# Patient Record
Sex: Female | Born: 1940 | Race: White | Hispanic: No | Smoking: Never smoker
Health system: Southern US, Community
[De-identification: ages and names within clinical notes are randomized; demographics above are authoritative.]

## PROBLEM LIST (undated history)

## (undated) DIAGNOSIS — K219 Gastro-esophageal reflux disease without esophagitis: Secondary | ICD-10-CM

## (undated) DIAGNOSIS — T39395A Adverse effect of other nonsteroidal anti-inflammatory drugs [NSAID], initial encounter: Secondary | ICD-10-CM

## (undated) DIAGNOSIS — Z8739 Personal history of other diseases of the musculoskeletal system and connective tissue: Secondary | ICD-10-CM

## (undated) DIAGNOSIS — R351 Nocturia: Secondary | ICD-10-CM

## (undated) DIAGNOSIS — E0590 Thyrotoxicosis, unspecified without thyrotoxic crisis or storm: Secondary | ICD-10-CM

## (undated) DIAGNOSIS — R112 Nausea with vomiting, unspecified: Secondary | ICD-10-CM

## (undated) DIAGNOSIS — M1990 Unspecified osteoarthritis, unspecified site: Secondary | ICD-10-CM

## (undated) DIAGNOSIS — Z9889 Other specified postprocedural states: Secondary | ICD-10-CM

## (undated) DIAGNOSIS — Q6100 Congenital renal cyst, unspecified: Secondary | ICD-10-CM

## (undated) DIAGNOSIS — E785 Hyperlipidemia, unspecified: Secondary | ICD-10-CM

## (undated) DIAGNOSIS — I10 Essential (primary) hypertension: Secondary | ICD-10-CM

## (undated) DIAGNOSIS — D649 Anemia, unspecified: Secondary | ICD-10-CM

## (undated) DIAGNOSIS — K2960 Other gastritis without bleeding: Secondary | ICD-10-CM

## (undated) DIAGNOSIS — K5730 Diverticulosis of large intestine without perforation or abscess without bleeding: Secondary | ICD-10-CM

## (undated) HISTORY — PX: CHOLECYSTECTOMY: SHX55

## (undated) HISTORY — PX: KNEE ARTHROSCOPY: SHX127

## (undated) HISTORY — PX: ABDOMINAL HYSTERECTOMY: SHX81

## (undated) HISTORY — PX: JOINT REPLACEMENT: SHX530

## (undated) HISTORY — PX: TUBAL LIGATION: SHX77

---

## 1998-09-02 ENCOUNTER — Emergency Department (HOSPITAL_COMMUNITY): Admission: EM | Admit: 1998-09-02 | Discharge: 1998-09-02 | Payer: Self-pay | Admitting: Emergency Medicine

## 1998-09-02 ENCOUNTER — Encounter: Payer: Self-pay | Admitting: Emergency Medicine

## 1998-12-18 ENCOUNTER — Other Ambulatory Visit: Admission: RE | Admit: 1998-12-18 | Discharge: 1998-12-18 | Payer: Self-pay | Admitting: Family Medicine

## 2000-01-18 ENCOUNTER — Encounter: Payer: Self-pay | Admitting: Gastroenterology

## 2000-01-18 ENCOUNTER — Ambulatory Visit (HOSPITAL_COMMUNITY): Admission: RE | Admit: 2000-01-18 | Discharge: 2000-01-18 | Payer: Self-pay | Admitting: Gastroenterology

## 2000-01-28 ENCOUNTER — Ambulatory Visit (HOSPITAL_COMMUNITY): Admission: RE | Admit: 2000-01-28 | Discharge: 2000-01-28 | Payer: Self-pay | Admitting: Gastroenterology

## 2000-01-31 ENCOUNTER — Encounter (HOSPITAL_BASED_OUTPATIENT_CLINIC_OR_DEPARTMENT_OTHER): Payer: Self-pay | Admitting: General Surgery

## 2000-02-02 ENCOUNTER — Encounter (HOSPITAL_BASED_OUTPATIENT_CLINIC_OR_DEPARTMENT_OTHER): Payer: Self-pay | Admitting: General Surgery

## 2000-02-02 ENCOUNTER — Encounter (INDEPENDENT_AMBULATORY_CARE_PROVIDER_SITE_OTHER): Payer: Self-pay | Admitting: *Deleted

## 2000-02-02 ENCOUNTER — Ambulatory Visit (HOSPITAL_COMMUNITY): Admission: RE | Admit: 2000-02-02 | Discharge: 2000-02-03 | Payer: Self-pay | Admitting: General Surgery

## 2000-12-19 ENCOUNTER — Other Ambulatory Visit: Admission: RE | Admit: 2000-12-19 | Discharge: 2000-12-19 | Payer: Self-pay | Admitting: Family Medicine

## 2002-01-22 ENCOUNTER — Ambulatory Visit (HOSPITAL_COMMUNITY): Admission: RE | Admit: 2002-01-22 | Discharge: 2002-01-22 | Payer: Self-pay | Admitting: Endocrinology

## 2002-01-22 ENCOUNTER — Encounter: Payer: Self-pay | Admitting: Endocrinology

## 2004-12-22 ENCOUNTER — Ambulatory Visit (HOSPITAL_BASED_OUTPATIENT_CLINIC_OR_DEPARTMENT_OTHER): Admission: RE | Admit: 2004-12-22 | Discharge: 2004-12-22 | Payer: Self-pay | Admitting: Orthopedic Surgery

## 2004-12-22 ENCOUNTER — Ambulatory Visit (HOSPITAL_COMMUNITY): Admission: RE | Admit: 2004-12-22 | Discharge: 2004-12-22 | Payer: Self-pay | Admitting: Orthopedic Surgery

## 2007-10-10 ENCOUNTER — Encounter (INDEPENDENT_AMBULATORY_CARE_PROVIDER_SITE_OTHER): Payer: Self-pay | Admitting: Obstetrics and Gynecology

## 2007-10-10 ENCOUNTER — Ambulatory Visit (HOSPITAL_COMMUNITY): Admission: RE | Admit: 2007-10-10 | Discharge: 2007-10-11 | Payer: Self-pay | Admitting: Obstetrics and Gynecology

## 2009-01-21 ENCOUNTER — Encounter (HOSPITAL_COMMUNITY): Admission: RE | Admit: 2009-01-21 | Discharge: 2009-04-06 | Payer: Self-pay | Admitting: Family Medicine

## 2009-01-23 ENCOUNTER — Ambulatory Visit (HOSPITAL_BASED_OUTPATIENT_CLINIC_OR_DEPARTMENT_OTHER): Admission: RE | Admit: 2009-01-23 | Discharge: 2009-01-23 | Payer: Self-pay | Admitting: Orthopedic Surgery

## 2009-03-18 ENCOUNTER — Encounter: Admission: RE | Admit: 2009-03-18 | Discharge: 2009-03-18 | Payer: Self-pay | Admitting: Endocrinology

## 2009-08-19 ENCOUNTER — Encounter: Admission: RE | Admit: 2009-08-19 | Discharge: 2009-08-19 | Payer: Self-pay | Admitting: Endocrinology

## 2010-09-20 ENCOUNTER — Other Ambulatory Visit: Payer: Self-pay | Admitting: Family Medicine

## 2010-09-21 ENCOUNTER — Ambulatory Visit
Admission: RE | Admit: 2010-09-21 | Discharge: 2010-09-21 | Disposition: A | Discharge status: Home / Self Care | Payer: Medicare Other | Source: Ambulatory Visit | Attending: Family Medicine | Admitting: Family Medicine

## 2010-09-21 MED ORDER — IOHEXOL 300 MG/ML  SOLN
100.0000 mL | Freq: Once | INTRAMUSCULAR | Status: AC | PRN
  Administered 2010-09-21: 100 mL via INTRAVENOUS

## 2010-10-10 LAB — BASIC METABOLIC PANEL
BUN: 10 mg/dL (ref 6–23)
Chloride: 105 mEq/L (ref 96–112)
Potassium: 3.7 mEq/L (ref 3.5–5.1)
Sodium: 140 mEq/L (ref 135–145)

## 2010-10-10 LAB — POCT HEMOGLOBIN-HEMACUE: Hemoglobin: 12.9 g/dL (ref 12.0–15.0)

## 2010-11-16 NOTE — Op Note (Signed)
NAMEDAWN, CONVERY NO.:  [id number]   MEDICAL RECORD NO.:  [id number]          PATIENT TYPE:  AMB   LOCATION:  SDC                           FACILITY:  [facility]   PHYSICIAN:  Huel Cote, M.D. DATE OF BIRTH:  02-Dec-1940   DATE OF PROCEDURE:  10/10/2007  DATE OF DISCHARGE:                               OPERATIVE REPORT   PREOPERATIVE DIAGNOSIS:  Pelvic prolapse.   POSTOPERATIVE DIAGNOSIS:  Pelvic prolapse.   PROCEDURE:  Total vaginal hysterectomy, bilateral salpingo-oophorectomy  and anterior posterior repair.   SURGEON:  Dr. Huel Cote.   ASSISTANT:  Dr. Tracey Harries.   ANESTHESIA:  General.   FINDINGS:  There is a small uterus noted and atrophic ovaries as well.  Large cystocele grade 3 and a moderate rectocele noted.  The patient did  have a slightly shortened vagina anatomically prior to procedure  beginning.   ESTIMATED BLOOD LOSS:  100 mL.   URINE OUTPUT:  Approximately 600 mL clear urine.   INTRAVENOUS FLUIDS:  1200 mL LR.   PROCEDURE:  The patient was taken to operating room where general  anesthesia was obtained without difficulty.  She was then prepped and  draped in the normal sterile fashion in the dorsal lithotomy position.  A weighted speculum was then placed within the vagina, the cervix  identified and noted to be prolapsing all way to the introitus.  The  cervix was injected circumferentially with a dilute solution of  Pitressin and then was circumferentially incised with the Bovie cautery.  This was then taken down sharply with the Mayo scissors with the vaginal  mucosa sharply dissected off the underlying cervix.  The anterior cul-de-  sac was pushed away from the overlying cervix and a Sims retractor  placed within that area.  The posterior cul-de-sac was entered sharply  and the Bonanno speculum placed within the patient's pelvis.  The  uterosacral ligaments were then taken down bilaterally with Zeppelin  clamps,  transected and suture ligated with 0 Vicryl.  With this  accomplished, the anterior cul-de-sac was able to be identified and  entered and thus the uterus was isolated from the bladder and the  rectum.  The remainder of the broad ligament and utero-ovarian ligaments  were then taken down sequentially with Zeppelin clamps.  Each step was  transected and suture ligated with a 0 Vicryl.  Once the uterus had been  completely amputated, it was handed off and the utero-ovarian pedicles  retracted downward.  The ovaries were then visible and grasped with a  Babcock clamp and pulled medially.  A Haney clamp was then placed around  the adnexa with ovary and at least a portion of the fallopian tube that  could be seen clamped, transected and the free infundibulopelvic  ligament was then suture ligated x2 with 0 Vicryl for hemostasis.  This  was done bilaterally with the adnexa handed off to pathology.  At this  point, all pedicles were inspected and there was no active bleeding  noted at any site.  The peritoneum was then identified and grasped with  a Allis  clamp and a pursestring suture was placed around the peritoneum  with 2-0 Vicryl swedged-on suture.  This was held and the uterosacral  ligaments were reapproximated with 0 Vicryl in a interrupted suture for  added cuff support.  The previously held uterosacral ligaments were also  tied to one another.  At this point, the peritoneal suture was cinched  down after all instruments and sponges were removed from the vagina.  All appeared hemostatic and therefore Allis clamps were utilized to  grasp the anterior portion of the vaginal cuff.  This was retracted  downwards and the vaginal mucosa overlying her moderate cystocele was  dissected away using the Metzenbaum scissors.  Allis clamps were used to  retract the overlying vaginal mucosa laterally and the pubovesical  fascia was dissected off the mucosa and pushed medially.  This was  performed  bilaterally and the cystocele taken back to approximately 3 cm  off of the vaginal mucosa.  At this point, once the flaps had been  successfully dissected off cystocele, the pubovesical fascia was  reapproximated with several interrupted sutures of 0 Vicryl to reduce  the cystocele.  This was performed nicely and the excess vaginal mucosa  trimmed away and sutured with a 2-0 Vicryl in a running locked fashion.  The vaginal cuff was then likewise closed with 2-0 Vicryl in a running  fashion and all appeared hemostatic.  The posterior portion was  inspected, the floor of the vagina and there was a rectocele noted.  It  was determined to do a conservative rectocele repair with a posterior  repair given that the vagina was anatomically shortened to begin with  and to enable the patient to continue to be sexually active if she so  desired after surgery.  The introitus was grasped with two Allis clamps  and the vaginal mucosa trimmed with Mayo scissors.  This was underscored  in the midline and the vaginal mucosa trimmed away from the underlying  rectovaginal fascia.  This was carried up to approximately within 1 cm  of the cuff and the vaginal mucosa flaps dissected off the underlying  rectovaginal fascia for approximately 1.5-2 cm.  With this performed,  the rectocele was reduced with several interrupted sutures of 0 Vicryl  to reapproximate the rectovaginal fascia.  Once this was reduced, the  mucosa was conservatively trimmed away and closed with 2-0 Vicryl in a  running locked fashion.  At the conclusion of the procedure, the vagina  was hemostatic.  There was no active bleeding noted.  All sponge, lap  and needle counts were correct x2 and the patient was awakened and taken  to the recovery room in stable condition after Estrace coated vaginal  packing was placed within the vagina.      Huel Cote, M.D.  Electronically Signed     KR/MEDQ  D:  10/10/2007  T:  10/10/2007  Job:   [id number]

## 2010-11-16 NOTE — Op Note (Signed)
NAMECANDELARIA, Jackie Lynch                ACCOUNT NO.:  [id number]   MEDICAL RECORD NO.:  [id number]          PATIENT TYPE:  AMB   LOCATION:  DSC                          FACILITY:  [facility]   PHYSICIAN:  Harvie Junior, M.D.   DATE OF BIRTH:  Jan 02, 1941   DATE OF PROCEDURE:  01/23/2009  DATE OF DISCHARGE:                               OPERATIVE REPORT   PREOPERATIVE DIAGNOSIS:  Medial meniscal tear.   POSTOPERATIVE DIAGNOSES:  1. Medial meniscal tear.  2. Chondromalacia of patellofemoral joint.   PROCEDURE:  Debridement of posterior horn medial meniscal tear.   BRIEF HISTORY:  Ms. Pettaway is a 70 year old female with a long history of  having had significant left knee pain.  We treated conservatively for a  long period of period time.  We had previously done a right knee  arthroscopy and it worked very well and she came in with catching and  locking with similar symptoms on the opposite side.  After discussion of  treatment options, we ultimately felt the most appropriate course of  action was to take her to the operating room for operative knee  arthroscopy.  She is brought to the operating room for this procedure.   PROCEDURE IN DETAIL:  The patient was brought to the operating room.  After adequate anesthesia was obtained with general anesthetic, the  patient was placed supine on the operating table.  The left leg was  prepped and draped in usual sterile fashion.  Following this, routine  arthroscopic examination of the left knee revealed there is an obvious  posterior horn medial meniscal tear.  This was debrided back to a smooth  and stable rim with straight-biting forceps, upbiting forceps, left and  side-biting forceps.  Once this completed, attention was turned to the  ACL which was normal.  Attention was turned to the lateral side which  had a little bit of peripheral sort of meniscal issue, nothing dramatic.  This was debrided minimally on the lateral side.  Attention was turned  to the patellofemoral joint which showed some midline tracking.  There  was no significant chondromalacia of the patellofemoral joint.  At this  point, the knee was copiously and thoroughly irrigated and suctioned  dried.  The arthroscopic portals were closed with a bandage.  A sterile  compressive dressing was applied.  The patient was taken to the recovery  room and was noted to be in satisfactory condition.  Estimated blood  loss for this procedure was none.      Harvie Junior, M.D.  Electronically Signed     JLG/MEDQ  D:  01/23/2009  T:  01/24/2009  Job:  [id number]

## 2010-11-16 NOTE — H&P (Signed)
Jackie Lynch, Jackie Lynch NO.:  [id number]   MEDICAL RECORD NO.:  [id number]           PATIENT TYPE:   LOCATION:                                 FACILITY:   PHYSICIAN:  Huel Cote, M.D. DATE OF BIRTH:  26-Mar-1941   DATE OF ADMISSION:  DATE OF DISCHARGE:                              HISTORY & PHYSICAL   PREOPERATIVE HISTORY AND PHYSICAL:  Surgery to take place at the [facility] facility on October 10, 2007, at 8:30 a.m.   The patient is a 70 year old G2, P2, who is coming in for a scheduled  total vaginal hysterectomy, bilateral salpingo-oophorectomy, anterior  posterior repair secondary to pelvic prolapse.  She began to notice her  prolapse worsening over the last several years and has been wanting to  proceed with fixing this problem and has recently felt that it is  becoming more symptomatic in terms of pelvic pressure and discomfort.  She did report some mild stress urinary incontinence, and,  given that  she has a grade 3 cystocele, she underwent urodynamic testing  concurrently and really could not demonstrate leakage.  She did have an  abnormal postvoid residual of approximately 200 mL after the procedure  but does feel that this is probably secondary to the bulge of her  bladder and that she cannot empty this well.  Given this abnormal post-  void residual and the lack of demonstration of leakage,  we discussed  with urology consult informally, and they felt it best not to proceed  with any concurrent sling procedure but to simply correct her  significant cystocele and see if that improves her postvoid residuals  and does not cause significant leakage.   PAST MEDICAL HISTORY:  Significant for chronic hypertension,  hypercholesterolemia and reflux disease.   PAST SURGICAL HISTORY:  She has had a cholecystectomy and a tubal  ligation.   She has had no abnormal Pap smears.  Her 2 obstetrical deliveries were  vaginal, one a 9 pound, 14 ounce infant  and one a 7 pound, 11 ounce  infant.   Her medications currently include amlodipine, hydrochlorothiazide,  metoprolol, clonidine, Diovan, and Vytorin as well as Nexium and  alendronate.   She has no allergies to any medications.   PHYSICAL EXAMINATION:  VITAL SIGNS:  Her height is 4'11.  Weight is 179  pounds.  CARDIAC:  Regular rate and rhythm.  LUNGS:  Clear.  ABDOMEN:  Soft and nontender.  PELVIC:  Exam is significant for uterine prolapse down to the level of  the introitus with a small uterus palpated.  She also has a grade 3  cystocele and a mild-to-moderate rectocele noted.   We discussed all surgical options, and the patient elects to proceed  with a vaginal hysterectomy and also wishes her tubes and ovaries  removed.  We will do this and proceed with an anterior posterior repair  to correct the cystocele.  She understands the risks of surgery,  including bleeding, infection and possible damage to bowel and  bladder.  She also understands that should a complication arise she would need  an  abdominal incision to deal with this.  She also  is aware that given her abnormal postvoid residual that her urinary  symptoms could worsen after surgery, and, if this is the case, we would  deal with this in a separate procedure.  The patient understands all  risks and  benefits and desires to proceed with surgery as stated.      Huel Cote, M.D.  Electronically Signed     KR/MEDQ  D:  10/09/2007  T:  10/09/2007  Job:  [id number]

## 2010-11-19 NOTE — Op Note (Signed)
[place]  Patient:    Jackie Lynch                        MRN: [id number] Proc. Date: 02/02/00 Adm. Date:  [id number] Disc. Date: [id number] Attending:  Sonda Primes CC:         Mardene Celeste. Lurene Shadow, M.D. (2)                           Operative Report  PREOPERATIVE DIAGNOSES:  Cholesterolosis, adenomyosis of the gallbladder.  POSTOPERATIVE DIAGNOSES:  Cholesterolosis, adenomyosis of the gallbladder.  PROCEDURE:  Laparoscopic cholecystectomy with intraoperative cholangiogram.  SURGEON:  Luisa Hart L. Lurene Shadow, M.D.  ASSISTANT:  Marnee Spring. Wiliam Ke, M.D.  ANESTHESIA:  General endotracheal anesthesia.  INDICATIONS:  This patient is a 70 year old woman presenting with rather classical gallbladder symptoms with bloating, abdominal pain and nausea following meals.  She had a gallbladder ultrasound which was essentially unremarkable for the evidence of some adenomyosis and floating mobile debris within the gallbladder.  A hepatobiliary scan which was done was otherwise normal showing normal gallbladder function and emptying.  Because of her persistent symptoms I feel shes got adenomyosis and is brought to the operating room for this purpose.  PROCEDURE:  Following the induction of satisfactory general endotracheal anesthesia with the patient positioned supinely the abdomen prepped and draped routinely.  Open laparoscopy of the umbilicus for the insertion of a Hasson cannula and insufflation of the peritoneal cavity with 14 mmHg pressure using carbon dioxide.  Visual exploration showed the liver edge to be sharp, liver surfaces smooth.  Enterogastric wall and duodenal seemed to be normal.  Neither the small and large intestine appeared to be abnormal.  Under direct vision, epigastric and lateral ports were placed, and the gallbladder was grasped and retracted cephalad with multiple adhesions of the gallbladder being taken down and dissection  carried down to the region of the ampulla.  In this area the cystic artery and cystic duct were both positively identified.  The cystic duct being traced to the cystic duct gallbladder junction and the cystic artery traced up into the gallbladder wall.  The cystic artery was doubly clipped and transected.  The cystic duct was cut proximally and opened.  I used a Riddick catheter passed through a 14 gauge angiocath into the cystic duct and the cholangiogram was taken.  The resulting cholangiogram showed a rather long cystic duct with free flow of contrast into the common bile duct and normal empyting into the duodenum.  The upper hepatic radicals appeared to be normal.  The cholangiocatheter was removed and the cystic duct was doubly clipped and then removed.  The gallbladder was then dissected free from the liver bed and maintaining hemostasis throughout the course of dissection with electrocautery.  At the end of dissection all areas within the liver bed were checked and noted to be dry.  Sponge, instrument and sharp counts were then verified as the gallbladder was removed through the umbilical port and the camera placed in the epigastric port.  The pneumoperitoneum was then allowed to deflate and the wound closed in layers as follows.  Umbilical wounds in two layers with 0 Dexon and 4-0 Dexon, epigastric and lateral flank wounds were closed with 4-0 Dexon sutures.  All wounds were reinforced with Steri-Strips.  Sterile dressings were applied.  Anesthetic was reversed.  The patient was removed from the operating room to the  recovery room in stable condition having tolerated the procedure well. DD:  02/02/00 TD:  02/03/00 Job: [id number]

## 2010-11-19 NOTE — Op Note (Signed)
[name], Jackie Lynch                ACCOUNT NO.:  [id number]   MEDICAL RECORD NO.:  [id number]          PATIENT TYPE:  AMB   LOCATION:  DSC                          FACILITY:  [facility]   PHYSICIAN:  Harvie Junior, M.D.   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   PREOPERATIVE DIAGNOSIS:  Medial meniscal tear with Baker's cyst.   POSTOPERATIVE DIAGNOSIS:  Medial meniscal tear with Baker's cyst.   PRINCIPAL PROCEDURE:  Partial posterior arm medial meniscectomy.   SURGEON:  Jodi Geralds, M.D.   ASSISTANT:  Jackie Lynch, P.A.   ANESTHESIA:  General.   BRIEF HISTORY:  She is a 70 year old female with a long history of having  right knee pain.  She ultimately was evaluated in the office and felt to  have a medial meniscal tear.  MRI was obtained prior to her coming which  showed medial meniscal tear.  It also showed a Baker's cyst.  We had a long  talk about her treatment options and also felt that operative knee  arthroscopy was the most critical course of action.  She is brought to the  operating room for this procedure.   PROCEDURE:  Patient brought to the operating room and after adequate  anesthesia was obtained with a general anesthetic the patient was placed  upon the operating room table.  The right leg was then prepped and draped in  the usual sterile fashion.  Following this, routine arthroscopic examination  of the knee revealed there was an obvious posterior horn medial meniscal  tear.  This was debrided back to a smooth and stable rim.  Attention was  turned to the medial femoral condyle where there was some grade 2 change but  nothing dramatic.  The ACL was normal, lateral side was evaluated and had a  little bit of peripheral fray which was debrided, but nothing dramatic.  At  this point, attention was turned up into the  patellofemoral joint where  there was no significant chondromalacia and  patella tracking was midline.  At this point, the knee was copiously  irrigated and suctioned dry.  The arthroscopic portal was closed with a  bandage, a sterile compression dressing was applied and the patient taken to  the recovery room.  She was noted to be in satisfactory condition.  Estimated blood loss for procedure was none.       JLG/MEDQ  D:  12/22/2004  T:  12/22/2004  Job:  [id number]   cc:   Scherrie Bateman  [address]  [place]  [place] [address]  Fax: [phone]

## 2011-02-17 ENCOUNTER — Other Ambulatory Visit: Payer: Self-pay | Admitting: Neurology

## 2011-02-17 DIAGNOSIS — G500 Trigeminal neuralgia: Secondary | ICD-10-CM

## 2011-02-23 ENCOUNTER — Ambulatory Visit
Admission: RE | Admit: 2011-02-23 | Discharge: 2011-02-23 | Disposition: A | Discharge status: Home / Self Care | Payer: Medicare Other | Source: Ambulatory Visit | Attending: Neurology | Admitting: Neurology

## 2011-02-23 DIAGNOSIS — G500 Trigeminal neuralgia: Secondary | ICD-10-CM

## 2011-02-23 MED ORDER — GADOBENATE DIMEGLUMINE 529 MG/ML IV SOLN
15.0000 mL | Freq: Once | INTRAVENOUS | Status: AC
  Administered 2011-02-23: 15 mL via INTRAVENOUS

## 2011-03-29 LAB — CBC
HCT: 37.9
Hemoglobin: 13.2
Platelets: 188
RBC: 3.72 — ABNORMAL LOW
RBC: 4.41
RDW: 13.4
WBC: 15.2 — ABNORMAL HIGH
WBC: 7.7

## 2011-03-29 LAB — COMPREHENSIVE METABOLIC PANEL
ALT: 19
Alkaline Phosphatase: 70
BUN: 19
CO2: 27
Chloride: 105
GFR calc non Af Amer: >60
Glucose, Bld: 113 — ABNORMAL HIGH
Potassium: 3.6
Sodium: 139
Total Bilirubin: 0.6

## 2011-08-09 DIAGNOSIS — K219 Gastro-esophageal reflux disease without esophagitis: Secondary | ICD-10-CM | POA: Diagnosis not present

## 2011-08-09 DIAGNOSIS — R1013 Epigastric pain: Secondary | ICD-10-CM | POA: Diagnosis not present

## 2011-08-09 DIAGNOSIS — K5900 Constipation, unspecified: Secondary | ICD-10-CM | POA: Diagnosis not present

## 2011-08-09 DIAGNOSIS — R141 Gas pain: Secondary | ICD-10-CM | POA: Diagnosis not present

## 2011-08-09 DIAGNOSIS — K625 Hemorrhage of anus and rectum: Secondary | ICD-10-CM | POA: Diagnosis not present

## 2011-09-08 DIAGNOSIS — M5137 Other intervertebral disc degeneration, lumbosacral region: Secondary | ICD-10-CM | POA: Diagnosis not present

## 2011-09-08 DIAGNOSIS — M169 Osteoarthritis of hip, unspecified: Secondary | ICD-10-CM | POA: Diagnosis not present

## 2011-09-08 DIAGNOSIS — M25559 Pain in unspecified hip: Secondary | ICD-10-CM | POA: Diagnosis not present

## 2011-09-22 DIAGNOSIS — E039 Hypothyroidism, unspecified: Secondary | ICD-10-CM | POA: Diagnosis not present

## 2011-09-28 DIAGNOSIS — I10 Essential (primary) hypertension: Secondary | ICD-10-CM | POA: Diagnosis not present

## 2011-09-28 DIAGNOSIS — E0520 Thyrotoxicosis with toxic multinodular goiter without thyrotoxic crisis or storm: Secondary | ICD-10-CM | POA: Diagnosis not present

## 2011-10-20 DIAGNOSIS — M25559 Pain in unspecified hip: Secondary | ICD-10-CM | POA: Diagnosis not present

## 2011-10-31 ENCOUNTER — Encounter (HOSPITAL_COMMUNITY): Payer: Self-pay

## 2011-11-01 ENCOUNTER — Other Ambulatory Visit: Payer: Self-pay | Admitting: Orthopedic Surgery

## 2011-11-10 ENCOUNTER — Encounter (HOSPITAL_COMMUNITY)
Admission: RE | Admit: 2011-11-10 | Discharge: 2011-11-10 | Disposition: A | Discharge status: Home / Self Care | Payer: Medicare Other | Source: Ambulatory Visit | Attending: Orthopedic Surgery | Admitting: Orthopedic Surgery

## 2011-11-10 ENCOUNTER — Encounter (HOSPITAL_COMMUNITY): Payer: Self-pay

## 2011-11-10 DIAGNOSIS — I10 Essential (primary) hypertension: Secondary | ICD-10-CM | POA: Diagnosis not present

## 2011-11-10 DIAGNOSIS — M129 Arthropathy, unspecified: Secondary | ICD-10-CM | POA: Diagnosis not present

## 2011-11-10 DIAGNOSIS — M169 Osteoarthritis of hip, unspecified: Secondary | ICD-10-CM | POA: Diagnosis not present

## 2011-11-10 DIAGNOSIS — M545 Low back pain, unspecified: Secondary | ICD-10-CM | POA: Diagnosis not present

## 2011-11-10 DIAGNOSIS — Z01811 Encounter for preprocedural respiratory examination: Secondary | ICD-10-CM | POA: Diagnosis not present

## 2011-11-10 DIAGNOSIS — K219 Gastro-esophageal reflux disease without esophagitis: Secondary | ICD-10-CM | POA: Diagnosis not present

## 2011-11-10 HISTORY — DX: Other gastritis without bleeding: K29.60

## 2011-11-10 HISTORY — DX: Thyrotoxicosis, unspecified without thyrotoxic crisis or storm: E05.90

## 2011-11-10 HISTORY — DX: Congenital renal cyst, unspecified: Q61.00

## 2011-11-10 HISTORY — DX: Essential (primary) hypertension: I10

## 2011-11-10 HISTORY — DX: Adverse effect of other nonsteroidal anti-inflammatory drugs (NSAID), initial encounter: T39.395A

## 2011-11-10 HISTORY — DX: Nausea with vomiting, unspecified: Z98.890

## 2011-11-10 HISTORY — DX: Diverticulosis of large intestine without perforation or abscess without bleeding: K57.30

## 2011-11-10 HISTORY — DX: Hyperlipidemia, unspecified: E78.5

## 2011-11-10 HISTORY — DX: Nocturia: R35.1

## 2011-11-10 HISTORY — DX: Gastro-esophageal reflux disease without esophagitis: K21.9

## 2011-11-10 HISTORY — DX: Nausea with vomiting, unspecified: R11.2

## 2011-11-10 HISTORY — DX: Unspecified osteoarthritis, unspecified site: M19.90

## 2011-11-10 LAB — PROTIME-INR
INR: 0.98 (ref 0.00–1.49)
Prothrombin Time: 13.2 seconds (ref 11.6–15.2)

## 2011-11-10 LAB — DIFFERENTIAL
Basophils Absolute: 0.1 10*3/uL (ref 0.0–0.1)
Lymphocytes Relative: 28 % (ref 12–46)
Monocytes Absolute: 0.8 10*3/uL (ref 0.1–1.0)
Neutro Abs: 5.9 10*3/uL (ref 1.7–7.7)

## 2011-11-10 LAB — TYPE AND SCREEN
ABO/RH(D): O NEG
Antibody Screen: NEGATIVE

## 2011-11-10 LAB — CBC
MCH: 29.8 pg (ref 26.0–34.0)
MCV: 88.3 fL (ref 78.0–100.0)
Platelets: 215 10*3/uL (ref 150–400)
RBC: 4.09 MIL/uL (ref 3.87–5.11)

## 2011-11-10 LAB — URINALYSIS, ROUTINE W REFLEX MICROSCOPIC
Bilirubin Urine: NEGATIVE
Hgb urine dipstick: NEGATIVE
Ketones, ur: NEGATIVE mg/dL
Protein, ur: NEGATIVE mg/dL
Urobilinogen, UA: 0.2 mg/dL (ref 0.0–1.0)

## 2011-11-10 LAB — COMPREHENSIVE METABOLIC PANEL
AST: 25 U/L (ref 0–37)
CO2: 24 mEq/L (ref 19–32)
Calcium: 9.6 mg/dL (ref 8.4–10.5)
Creatinine, Ser: 0.99 mg/dL (ref 0.50–1.10)
GFR calc Af Amer: 66 mL/min — ABNORMAL LOW (ref >90)
GFR calc non Af Amer: 57 mL/min — ABNORMAL LOW (ref >90)

## 2011-11-10 NOTE — Pre-Procedure Instructions (Signed)
20 Jackie Lynch  11/10/2011   Your procedure is scheduled on:  Monday, May 13  Report to [facility] at 1100 AM.  Call this number if you have problems the morning of surgery: [phone]   Remember:   Do not eat food:After Midnight.  May have clear liquids: up to 4 Hours before arrival.(0700am)  Clear liquids include soda, tea, black coffee, apple or grape juice, broth.  Take these medicines the morning of surgery with A SIP OF WATER: *Amlodipine,Clonidine,Nexium,Methamazole,Metoprolol**   Do not wear jewelry, make-up or nail polish.  Do not wear lotions, powders, or perfumes. You may wear deodorant.  Do not shave 48 hours prior to surgery.  Do not bring valuables to the hospital.  Contacts, dentures or bridgework may not be worn into surgery.  Leave suitcase in the car. After surgery it may be brought to your room.  For patients admitted to the hospital, checkout time is 11:00 AM the day of discharge.   Patients discharged the day of surgery will not be allowed to drive home.  Name and phone number of your driver: *n/a**  Special Instructions: Incentive Spirometry - Practice and bring it with you on the day of surgery. and CHG Shower Use Special Wash: 1/2 bottle night before surgery and 1/2 bottle morning of surgery.   Please read over the following fact sheets that you were given: Pain Booklet, Coughing and Deep Breathing, Blood Transfusion Information, Total Joint Packet, MRSA Information and Surgical Site Infection Prevention

## 2011-11-13 MED ORDER — CEFAZOLIN SODIUM-DEXTROSE 2-3 GM-% IV SOLR
2.0000 g | INTRAVENOUS | Status: AC
  Administered 2011-11-14: 2 g via INTRAVENOUS
  Filled 2011-11-13: qty 50

## 2011-11-14 ENCOUNTER — Inpatient Hospital Stay (HOSPITAL_COMMUNITY): Payer: Medicare Other

## 2011-11-14 ENCOUNTER — Inpatient Hospital Stay (HOSPITAL_COMMUNITY)
Admission: RE | Admit: 2011-11-14 | Discharge: 2011-11-17 | DRG: 470 | Disposition: A | Discharge status: Home Health | Payer: Medicare Other | Source: Ambulatory Visit | Attending: Orthopedic Surgery | Admitting: Orthopedic Surgery

## 2011-11-14 ENCOUNTER — Encounter (HOSPITAL_COMMUNITY): Payer: Self-pay | Admitting: *Deleted

## 2011-11-14 ENCOUNTER — Encounter (HOSPITAL_COMMUNITY)
Admission: RE | Disposition: A | Discharge status: Home Health | Payer: Self-pay | Source: Ambulatory Visit | Attending: Orthopedic Surgery

## 2011-11-14 ENCOUNTER — Ambulatory Visit (HOSPITAL_COMMUNITY): Payer: Medicare Other | Admitting: Anesthesiology

## 2011-11-14 ENCOUNTER — Encounter (HOSPITAL_COMMUNITY): Payer: Self-pay | Admitting: Anesthesiology

## 2011-11-14 DIAGNOSIS — E876 Hypokalemia: Secondary | ICD-10-CM | POA: Diagnosis present

## 2011-11-14 DIAGNOSIS — M1610 Unilateral primary osteoarthritis, unspecified hip: Principal | ICD-10-CM | POA: Diagnosis present

## 2011-11-14 DIAGNOSIS — N289 Disorder of kidney and ureter, unspecified: Secondary | ICD-10-CM | POA: Diagnosis present

## 2011-11-14 DIAGNOSIS — Z96649 Presence of unspecified artificial hip joint: Secondary | ICD-10-CM | POA: Diagnosis not present

## 2011-11-14 DIAGNOSIS — I10 Essential (primary) hypertension: Secondary | ICD-10-CM | POA: Diagnosis present

## 2011-11-14 DIAGNOSIS — Z01812 Encounter for preprocedural laboratory examination: Secondary | ICD-10-CM

## 2011-11-14 DIAGNOSIS — M169 Osteoarthritis of hip, unspecified: Secondary | ICD-10-CM | POA: Diagnosis present

## 2011-11-14 DIAGNOSIS — M25559 Pain in unspecified hip: Secondary | ICD-10-CM | POA: Diagnosis not present

## 2011-11-14 DIAGNOSIS — K5900 Constipation, unspecified: Secondary | ICD-10-CM | POA: Diagnosis present

## 2011-11-14 DIAGNOSIS — K219 Gastro-esophageal reflux disease without esophagitis: Secondary | ICD-10-CM | POA: Diagnosis present

## 2011-11-14 DIAGNOSIS — M129 Arthropathy, unspecified: Secondary | ICD-10-CM | POA: Diagnosis not present

## 2011-11-14 DIAGNOSIS — M1612 Unilateral primary osteoarthritis, left hip: Secondary | ICD-10-CM | POA: Diagnosis present

## 2011-11-14 DIAGNOSIS — E785 Hyperlipidemia, unspecified: Secondary | ICD-10-CM | POA: Diagnosis present

## 2011-11-14 DIAGNOSIS — N19 Unspecified kidney failure: Secondary | ICD-10-CM | POA: Diagnosis not present

## 2011-11-14 DIAGNOSIS — Z471 Aftercare following joint replacement surgery: Secondary | ICD-10-CM | POA: Diagnosis not present

## 2011-11-14 HISTORY — PX: TOTAL HIP ARTHROPLASTY: SHX124

## 2011-11-14 SURGERY — ARTHROPLASTY, HIP, TOTAL,POSTERIOR APPROACH
Anesthesia: General | Site: Hip | Laterality: Left | Wound class: Clean

## 2011-11-14 MED ORDER — CLONIDINE HCL 0.3 MG PO TABS
0.3000 mg | ORAL_TABLET | Freq: Two times a day (BID) | ORAL | Status: DC
  Administered 2011-11-14 – 2011-11-16 (×3): 0.3 mg via ORAL
  Filled 2011-11-14 (×7): qty 1

## 2011-11-14 MED ORDER — ZOLPIDEM TARTRATE 5 MG PO TABS: 5.0000 mg | ORAL_TABLET | Freq: Every evening | ORAL | Status: DC | PRN

## 2011-11-14 MED ORDER — HYDROCHLOROTHIAZIDE 25 MG PO TABS
25.0000 mg | ORAL_TABLET | Freq: Every day | ORAL | Status: DC
  Filled 2011-11-14 (×4): qty 1

## 2011-11-14 MED ORDER — METHIMAZOLE 5 MG PO TABS
5.0000 mg | ORAL_TABLET | ORAL | Status: DC
  Administered 2011-11-14 – 2011-11-16 (×2): 5 mg via ORAL
  Filled 2011-11-14 (×3): qty 1

## 2011-11-14 MED ORDER — ONDANSETRON HCL 4 MG/2ML IJ SOLN
INTRAMUSCULAR | Status: DC | PRN
  Administered 2011-11-14: 4 mg via INTRAVENOUS

## 2011-11-14 MED ORDER — COUMADIN BOOK
Freq: Once | Status: DC
  Filled 2011-11-14: qty 1

## 2011-11-14 MED ORDER — POVIDONE-IODINE 7.5 % EX SOLN
Freq: Once | CUTANEOUS | Status: DC
  Filled 2011-11-14: qty 118

## 2011-11-14 MED ORDER — NEOSTIGMINE METHYLSULFATE 1 MG/ML IJ SOLN
INTRAMUSCULAR | Status: DC | PRN
  Administered 2011-11-14: 3 mg via INTRAVENOUS

## 2011-11-14 MED ORDER — WARFARIN - PHARMACIST DOSING INPATIENT: Freq: Every day | Status: DC

## 2011-11-14 MED ORDER — HYDROMORPHONE HCL PF 1 MG/ML IJ SOLN
0.2500 mg | INTRAMUSCULAR | Status: DC | PRN
  Administered 2011-11-14 (×3): 0.5 mg via INTRAVENOUS

## 2011-11-14 MED ORDER — HYDROMORPHONE HCL PF 1 MG/ML IJ SOLN: 0.2500 mg | INTRAMUSCULAR | Status: DC | PRN

## 2011-11-14 MED ORDER — ACETAMINOPHEN 10 MG/ML IV SOLN
INTRAVENOUS | Status: AC
  Filled 2011-11-14: qty 100

## 2011-11-14 MED ORDER — PANTOPRAZOLE SODIUM 40 MG PO TBEC
80.0000 mg | DELAYED_RELEASE_TABLET | Freq: Every day | ORAL | Status: DC
  Administered 2011-11-15 – 2011-11-17 (×3): 80 mg via ORAL
  Filled 2011-11-14 (×4): qty 2

## 2011-11-14 MED ORDER — DROPERIDOL 2.5 MG/ML IJ SOLN: 0.6250 mg | INTRAMUSCULAR | Status: DC | PRN

## 2011-11-14 MED ORDER — PROPOFOL 10 MG/ML IV EMUL
INTRAVENOUS | Status: DC | PRN
  Administered 2011-11-14: 30 mg via INTRAVENOUS
  Administered 2011-11-14: 150 mg via INTRAVENOUS
  Administered 2011-11-14: 20 mg via INTRAVENOUS

## 2011-11-14 MED ORDER — ALUM & MAG HYDROXIDE-SIMETH 200-200-20 MG/5ML PO SUSP: 30.0000 mL | ORAL | Status: DC | PRN

## 2011-11-14 MED ORDER — FENOFIBRATE 160 MG PO TABS
160.0000 mg | ORAL_TABLET | Freq: Every day | ORAL | Status: DC
  Administered 2011-11-14 – 2011-11-17 (×4): 160 mg via ORAL
  Filled 2011-11-14 (×4): qty 1

## 2011-11-14 MED ORDER — DOCUSATE SODIUM 100 MG PO CAPS
100.0000 mg | ORAL_CAPSULE | Freq: Two times a day (BID) | ORAL | Status: DC
  Administered 2011-11-14 – 2011-11-17 (×6): 100 mg via ORAL
  Filled 2011-11-14 (×8): qty 1

## 2011-11-14 MED ORDER — LOSARTAN POTASSIUM 50 MG PO TABS
100.0000 mg | ORAL_TABLET | Freq: Every day | ORAL | Status: DC
  Administered 2011-11-14 – 2011-11-15 (×2): 100 mg via ORAL
  Filled 2011-11-14 (×4): qty 2

## 2011-11-14 MED ORDER — ACETAMINOPHEN 325 MG PO TABS: 650.0000 mg | ORAL_TABLET | Freq: Four times a day (QID) | ORAL | Status: DC | PRN

## 2011-11-14 MED ORDER — ACETAMINOPHEN 650 MG RE SUPP: 650.0000 mg | Freq: Four times a day (QID) | RECTAL | Status: DC | PRN

## 2011-11-14 MED ORDER — AMLODIPINE BESYLATE 10 MG PO TABS
10.0000 mg | ORAL_TABLET | Freq: Every day | ORAL | Status: DC
  Filled 2011-11-14 (×3): qty 1

## 2011-11-14 MED ORDER — WARFARIN SODIUM 5 MG PO TABS
5.0000 mg | ORAL_TABLET | Freq: Once | ORAL | Status: AC
  Administered 2011-11-14: 5 mg via ORAL
  Filled 2011-11-14: qty 1

## 2011-11-14 MED ORDER — FENTANYL CITRATE 0.05 MG/ML IJ SOLN: 50.0000 ug | INTRAMUSCULAR | Status: DC | PRN

## 2011-11-14 MED ORDER — BUPIVACAINE-EPINEPHRINE 0.5% -1:200000 IJ SOLN
INTRAMUSCULAR | Status: DC | PRN
  Administered 2011-11-14: 20 mL

## 2011-11-14 MED ORDER — MIDAZOLAM HCL 2 MG/2ML IJ SOLN: 1.0000 mg | INTRAMUSCULAR | Status: DC | PRN

## 2011-11-14 MED ORDER — METOPROLOL TARTRATE 50 MG PO TABS
50.0000 mg | ORAL_TABLET | Freq: Every day | ORAL | Status: DC
  Administered 2011-11-14: 50 mg via ORAL
  Filled 2011-11-14 (×4): qty 1

## 2011-11-14 MED ORDER — ONDANSETRON HCL 4 MG/2ML IJ SOLN
4.0000 mg | Freq: Four times a day (QID) | INTRAMUSCULAR | Status: DC | PRN
  Administered 2011-11-14 – 2011-11-15 (×3): 4 mg via INTRAVENOUS
  Filled 2011-11-14 (×3): qty 2

## 2011-11-14 MED ORDER — LORAZEPAM 2 MG/ML IJ SOLN: 1.0000 mg | Freq: Once | INTRAMUSCULAR | Status: DC | PRN

## 2011-11-14 MED ORDER — KETOROLAC TROMETHAMINE 30 MG/ML IJ SOLN
15.0000 mg | Freq: Three times a day (TID) | INTRAMUSCULAR | Status: AC
  Administered 2011-11-14 – 2011-11-15 (×3): 15 mg via INTRAVENOUS
  Filled 2011-11-14 (×3): qty 1

## 2011-11-14 MED ORDER — FERROUS SULFATE 325 (65 FE) MG PO TABS
325.0000 mg | ORAL_TABLET | Freq: Two times a day (BID) | ORAL | Status: DC
  Administered 2011-11-15 – 2011-11-17 (×5): 325 mg via ORAL
  Filled 2011-11-14 (×8): qty 1

## 2011-11-14 MED ORDER — PROMETHAZINE HCL 25 MG/ML IJ SOLN: 6.2500 mg | INTRAMUSCULAR | Status: DC | PRN

## 2011-11-14 MED ORDER — LACTATED RINGERS IV SOLN
INTRAVENOUS | Status: DC
  Administered 2011-11-14 (×2): via INTRAVENOUS

## 2011-11-14 MED ORDER — GLYCOPYRROLATE 0.2 MG/ML IJ SOLN
INTRAMUSCULAR | Status: DC | PRN
  Administered 2011-11-14: 0.4 mg via INTRAVENOUS

## 2011-11-14 MED ORDER — LACTATED RINGERS IV SOLN
INTRAVENOUS | Status: DC | PRN
  Administered 2011-11-14: 13:00:00 via INTRAVENOUS

## 2011-11-14 MED ORDER — WARFARIN VIDEO
Freq: Once | Status: AC
  Administered 2011-11-15: 17:00:00

## 2011-11-14 MED ORDER — WARFARIN VIDEO: Freq: Once | Status: DC

## 2011-11-14 MED ORDER — FENTANYL CITRATE 0.05 MG/ML IJ SOLN
INTRAMUSCULAR | Status: DC | PRN
  Administered 2011-11-14: 25 ug via INTRAVENOUS
  Administered 2011-11-14 (×3): 50 ug via INTRAVENOUS

## 2011-11-14 MED ORDER — SIMVASTATIN 40 MG PO TABS
40.0000 mg | ORAL_TABLET | Freq: Every day | ORAL | Status: DC
  Administered 2011-11-14: 40 mg via ORAL
  Filled 2011-11-14 (×2): qty 1

## 2011-11-14 MED ORDER — LIDOCAINE HCL (CARDIAC) 20 MG/ML IV SOLN
INTRAVENOUS | Status: DC | PRN
  Administered 2011-11-14: 80 mg via INTRAVENOUS

## 2011-11-14 MED ORDER — DEXTROSE 5 % IV SOLN
500.0000 mg | Freq: Four times a day (QID) | INTRAVENOUS | Status: DC | PRN
  Filled 2011-11-14: qty 5

## 2011-11-14 MED ORDER — HYDROMORPHONE HCL PF 1 MG/ML IJ SOLN
INTRAMUSCULAR | Status: AC
  Filled 2011-11-14: qty 1

## 2011-11-14 MED ORDER — ACETAMINOPHEN 10 MG/ML IV SOLN
1000.0000 mg | Freq: Four times a day (QID) | INTRAVENOUS | Status: AC
  Administered 2011-11-14 – 2011-11-15 (×2): 1000 mg via INTRAVENOUS
  Filled 2011-11-14 (×4): qty 100

## 2011-11-14 MED ORDER — ONDANSETRON HCL 4 MG PO TABS: 4.0000 mg | ORAL_TABLET | Freq: Four times a day (QID) | ORAL | Status: DC | PRN

## 2011-11-14 MED ORDER — SODIUM CHLORIDE 0.9 % IR SOLN
Status: DC | PRN
  Administered 2011-11-14: 1000 mL

## 2011-11-14 MED ORDER — METHOCARBAMOL 500 MG PO TABS
500.0000 mg | ORAL_TABLET | Freq: Four times a day (QID) | ORAL | Status: DC | PRN
  Administered 2011-11-15 – 2011-11-16 (×3): 500 mg via ORAL
  Filled 2011-11-14 (×3): qty 1

## 2011-11-14 MED ORDER — ACETAMINOPHEN 10 MG/ML IV SOLN
INTRAVENOUS | Status: DC | PRN
  Administered 2011-11-14: 1000 mg via INTRAVENOUS

## 2011-11-14 MED ORDER — MIDAZOLAM HCL 5 MG/5ML IJ SOLN
INTRAMUSCULAR | Status: DC | PRN
  Administered 2011-11-14: 2 mg via INTRAVENOUS

## 2011-11-14 MED ORDER — OXYCODONE-ACETAMINOPHEN 5-325 MG PO TABS
1.0000 | ORAL_TABLET | ORAL | Status: DC | PRN
  Administered 2011-11-15 (×3): 2 via ORAL
  Administered 2011-11-16: 1 via ORAL
  Administered 2011-11-16: 2 via ORAL
  Administered 2011-11-16 – 2011-11-17 (×3): 1 via ORAL
  Filled 2011-11-14: qty 2
  Filled 2011-11-14 (×2): qty 1
  Filled 2011-11-14 (×3): qty 2
  Filled 2011-11-14 (×2): qty 1

## 2011-11-14 MED ORDER — DEXAMETHASONE SODIUM PHOSPHATE 10 MG/ML IJ SOLN
INTRAMUSCULAR | Status: DC | PRN
  Administered 2011-11-14: 10 mg via INTRAVENOUS

## 2011-11-14 MED ORDER — HYDRALAZINE HCL 20 MG/ML IJ SOLN
INTRAMUSCULAR | Status: DC | PRN
  Administered 2011-11-14: 10 mg via INTRAVENOUS

## 2011-11-14 MED ORDER — HYDROMORPHONE HCL PF 1 MG/ML IJ SOLN
0.5000 mg | INTRAMUSCULAR | Status: DC | PRN
  Filled 2011-11-14: qty 1

## 2011-11-14 MED ORDER — CEFAZOLIN SODIUM-DEXTROSE 2-3 GM-% IV SOLR
2.0000 g | Freq: Four times a day (QID) | INTRAVENOUS | Status: AC
  Administered 2011-11-14 – 2011-11-15 (×3): 2 g via INTRAVENOUS
  Filled 2011-11-14 (×3): qty 50

## 2011-11-14 MED ORDER — ROCURONIUM BROMIDE 100 MG/10ML IV SOLN
INTRAVENOUS | Status: DC | PRN
  Administered 2011-11-14: 40 mg via INTRAVENOUS

## 2011-11-14 MED ORDER — SENNOSIDES-DOCUSATE SODIUM 8.6-50 MG PO TABS: 1.0000 | ORAL_TABLET | Freq: Every evening | ORAL | Status: DC | PRN

## 2011-11-14 SURGICAL SUPPLY — 57 items
BLADE SAW SAG 73X25 THK (BLADE) ×1
BLADE SAW SGTL 73X25 THK (BLADE) ×1 IMPLANT
BRUSH FEMORAL CANAL (MISCELLANEOUS) IMPLANT
CLOTH BEACON ORANGE TIMEOUT ST (SAFETY) ×2 IMPLANT
COVER BACK TABLE 24X17X13 BIG (DRAPES) IMPLANT
COVER SURGICAL LIGHT HANDLE (MISCELLANEOUS) ×2 IMPLANT
DRAPE ORTHO SPLIT 77X108 STRL (DRAPES) ×4
DRAPE PROXIMA HALF (DRAPES) ×2 IMPLANT
DRAPE SURG ORHT 6 SPLT 77X108 (DRAPES) ×2 IMPLANT
DRAPE U-SHAPE 47X51 STRL (DRAPES) ×2 IMPLANT
DRILL BIT 7/64X5 (BIT) ×2 IMPLANT
DRSG MEPILEX BORDER 4X12 (GAUZE/BANDAGES/DRESSINGS) ×2 IMPLANT
DRSG MEPILEX BORDER 4X8 (GAUZE/BANDAGES/DRESSINGS) ×2 IMPLANT
DURAPREP 26ML APPLICATOR (WOUND CARE) ×2 IMPLANT
ELECT BLADE 6.5 EXT (BLADE) ×1 IMPLANT
ELECT CAUTERY BLADE 6.4 (BLADE) ×2 IMPLANT
ELECT REM PT RETURN 9FT ADLT (ELECTROSURGICAL) ×2
ELECTRODE REM PT RTRN 9FT ADLT (ELECTROSURGICAL) ×1 IMPLANT
EVACUATOR 1/8 PVC DRAIN (DRAIN) IMPLANT
GLOVE BIOGEL PI IND STRL 8 (GLOVE) ×2 IMPLANT
GLOVE BIOGEL PI INDICATOR 8 (GLOVE) ×2
GLOVE ECLIPSE 7.5 STRL STRAW (GLOVE) ×4 IMPLANT
GOWN PREVENTION PLUS XLARGE (GOWN DISPOSABLE) ×2 IMPLANT
GOWN SRG XL XLNG 56XLVL 4 (GOWN DISPOSABLE) ×1 IMPLANT
GOWN STRL NON-REIN LRG LVL3 (GOWN DISPOSABLE) ×2 IMPLANT
GOWN STRL NON-REIN XL XLG LVL4 (GOWN DISPOSABLE) ×2
HANDPIECE INTERPULSE COAX TIP (DISPOSABLE)
HOOD PEEL AWAY FACE SHEILD DIS (HOOD) ×6 IMPLANT
IMMOBILIZER KNEE 20 (SOFTGOODS)
IMMOBILIZER KNEE 20 THIGH 36 (SOFTGOODS) IMPLANT
IMMOBILIZER KNEE 22 UNIV (SOFTGOODS) IMPLANT
IMMOBILIZER KNEE 24 THIGH 36 (MISCELLANEOUS) IMPLANT
IMMOBILIZER KNEE 24 UNIV (MISCELLANEOUS)
KIT BASIN OR (CUSTOM PROCEDURE TRAY) ×2 IMPLANT
KIT ROOM TURNOVER OR (KITS) ×2 IMPLANT
MANIFOLD NEPTUNE II (INSTRUMENTS) ×2 IMPLANT
NEEDLE 1/2 CIR MAYO (NEEDLE) ×2 IMPLANT
NEEDLE 22X1 1/2 (OR ONLY) (NEEDLE) ×2 IMPLANT
NS IRRIG 1000ML POUR BTL (IV SOLUTION) ×2 IMPLANT
PACK TOTAL JOINT (CUSTOM PROCEDURE TRAY) ×2 IMPLANT
PAD ARMBOARD 7.5X6 YLW CONV (MISCELLANEOUS) ×4 IMPLANT
PASSER SUT SWANSON 36MM LOOP (INSTRUMENTS) ×1 IMPLANT
PRESSURIZER FEMORAL UNIV (MISCELLANEOUS) IMPLANT
SET HNDPC FAN SPRY TIP SCT (DISPOSABLE) IMPLANT
STAPLER VISISTAT 35W (STAPLE) ×2 IMPLANT
SUCTION FRAZIER TIP 10 FR DISP (SUCTIONS) ×1 IMPLANT
SUT ETHIBOND 2 V 37 (SUTURE) ×2 IMPLANT
SUT VIC AB 0 CTXB 36 (SUTURE) ×4 IMPLANT
SUT VIC AB 1 CTX 36 (SUTURE) ×4
SUT VIC AB 1 CTX36XBRD ANBCTR (SUTURE) ×2 IMPLANT
SUT VIC AB 2-0 CTB1 (SUTURE) ×4 IMPLANT
SYR CONTROL 10ML LL (SYRINGE) ×2 IMPLANT
TOWEL OR 17X24 6PK STRL BLUE (TOWEL DISPOSABLE) ×2 IMPLANT
TOWEL OR 17X26 10 PK STRL BLUE (TOWEL DISPOSABLE) ×2 IMPLANT
TOWER CARTRIDGE SMART MIX (DISPOSABLE) IMPLANT
TRAY FOLEY CATH 14FR (SET/KITS/TRAYS/PACK) ×1 IMPLANT
WATER STERILE IRR 1000ML POUR (IV SOLUTION) ×5 IMPLANT

## 2011-11-14 NOTE — Brief Op Note (Signed)
11/14/2011  3:31 PM  PATIENT:  Jackie Lynch  71 y.o. female  PRE-OPERATIVE DIAGNOSIS:  DEGENERATIVE JOINT DISEASE left hip POST-OPERATIVE DIAGNOSIS:  DEGENERATIVE JOINT DISEASE left hip  PROCEDURE:  Procedure(s) (LRB): TOTAL HIP ARTHROPLASTY (Left)  SURGEON:  Surgeon(s) and Role:    * Harvie Junior, MD - Primary  PHYSICIAN ASSISTANT: Nishita Isaacks PA-C  ANESTHESIA:   general  EBL:  Total I/O In: 1100 [I.V.:1100] Out: 450 [Urine:250; Blood:200]  BLOOD ADMINISTERED:none  DRAINS: none   LOCAL MEDICATIONS USED:  MARCAINE     SPECIMEN:  No Specimen  DISPOSITION OF SPECIMEN:  N/A  COUNTS:  YES  TOURNIQUET:  * No tourniquets in log *  DICTATION: .Other Dictation: Dictation Number   PLAN OF CARE: Admit to inpatient   PATIENT DISPOSITION:  PACU - hemodynamically stable.   Delay start of Pharmacological VTE agent (>24hrs) due to surgical blood loss or risk of bleeding: not applicable

## 2011-11-14 NOTE — Transfer of Care (Signed)
Immediate Anesthesia Transfer of Care Note  Patient: Jackie Lynch  Procedure(s) Performed: Procedure(s) (LRB): TOTAL HIP ARTHROPLASTY (Left)  Patient Location: PACU  Anesthesia Type: General  Level of Consciousness: patient cooperative, lethargic and responds to stimulation  Airway & Oxygen Therapy: Patient Spontanous Breathing and Patient connected to nasal cannula oxygen  Post-op Assessment: Report given to PACU RN  Post vital signs: Reviewed and stable  Complications: No apparent anesthesia complications

## 2011-11-14 NOTE — Preoperative (Signed)
Beta Blockers   Reason not to administer Beta Blockers:Not Applicable, last dose 11/14/11 @ 06:45

## 2011-11-14 NOTE — Brief Op Note (Signed)
11/14/2011  3:30 PM  PATIENT:  Jackie Lynch  71 y.o. female  PRE-OPERATIVE DIAGNOSIS:  DEGENERATIVE JOINT DISEASE  POST-OPERATIVE DIAGNOSIS:  DEGENERATIVE JOINT DISEASE  PROCEDURE:  Procedure(s) (LRB): TOTAL HIP ARTHROPLASTY (Left)  SURGEON:  Surgeon(s) and Role:    * Harvie Junior, MD - Primary  PHYSICIAN ASSISTANT:   ASSISTANTbethune  ANESTHESIA:   general  EBL:  Total I/O In: 1100 [I.V.:1100] Out: 450 [Urine:250; Blood:200]  BLOOD ADMINISTERED:none  DRAINS: none and Gastrostomy Tube   LOCAL MEDICATIONS USED:  MARCAINE     SPECIMEN:  No Specimen  DISPOSITION OF SPECIMEN:  N/A  COUNTS:  YES  TOURNIQUET:  * No tourniquets in log *  DICTATION: .Other Dictation: Dictation Number [id number]  PLAN OF CARE: Admit to inpatient   PATIENT DISPOSITION:  PACU - hemodynamically stable.   Delay start of Pharmacological VTE agent (>24hrs) due to surgical blood loss or risk of bleeding: no

## 2011-11-14 NOTE — Anesthesia Preprocedure Evaluation (Addendum)
Anesthesia Evaluation  Patient identified by MRN, date of birth, ID band Patient awake    Reviewed: Allergy & Precautions, H&P , NPO status , Patient's Chart, lab work & pertinent test results  History of Anesthesia Complications (+) PONV  Airway Mallampati: I TM Distance: >3 FB Neck ROM: Full    Dental  (+) Partial Upper, Dental Advisory Given and Missing   Pulmonary  breath sounds clear to auscultation  Pulmonary exam normal       Cardiovascular hypertension, Rhythm:Regular Rate:Normal     Neuro/Psych    GI/Hepatic GERD-  ,  Endo/Other  Hyperthyroidism   Renal/GU      Musculoskeletal   Abdominal (+) + obese,   Peds  Hematology   Anesthesia Other Findings   Reproductive/Obstetrics                          Anesthesia Physical Anesthesia Plan  ASA: II  Anesthesia Plan: General   Post-op Pain Management:    Induction: Intravenous  Airway Management Planned: Oral ETT  Additional Equipment:   Intra-op Plan:   Post-operative Plan: Extubation in OR  Informed Consent: I have reviewed the patients History and Physical, chart, labs and discussed the procedure including the risks, benefits and alternatives for the proposed anesthesia with the patient or authorized representative who has indicated his/her understanding and acceptance.     Plan Discussed with: CRNA and Surgeon  Anesthesia Plan Comments:         Anesthesia Quick Evaluation

## 2011-11-14 NOTE — H&P (Signed)
PREOPERATIVE H&P  Chief Complaint: l. Hip pain  HPI: Jackie Lynch is a 71 y.o. female who presents for evaluation of l. Hip pain. It has been present for greater than 1 year  and has been worsening. She has failed conservative measures.  She has x-rays showing bone on bone changes Pain is rated as severe.  Past Medical History  Diagnosis Date  . PONV (postoperative nausea and vomiting)   . Hyperthyroidism   . Hypertension   . GERD (gastroesophageal reflux disease)   . Arthritis   . 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 Constipation   . Diverticulosis of colon   . Renal cyst, congenital, right   . Nocturia   . Hyperlipidemia    Past Surgical History  Procedure Date  . Cholecystectomy   . Tubal ligation   . Knee arthroscopy     right and left  . Abdominal hysterectomy    History   Social History  . Marital Status: Married    Spouse Name: N/A    Number of Children: N/A  . Years of Education: N/A   Social History Main Topics  . Smoking status: Never Smoker   . Smokeless tobacco: Never Used  . Alcohol Use: No  . Drug Use: No  . Sexually Active: No   Other Topics Concern  . None   Social History Narrative  . None   Family History  Problem Relation Age of Onset  . Anesthesia problems Neg Hx    No Known Allergies Prior to Admission medications   Medication Sig Start Date End Date Taking? Authorizing Provider  amLODipine (NORVASC) 10 MG tablet Take 10 mg by mouth daily.   Yes Historical Provider, MD  Calcium Carbonate-Vitamin D (CALCIUM 600 + D PO) Take 1 tablet by mouth daily.   Yes Historical Provider, MD  cloNIDine (CATAPRES) 0.3 MG tablet Take 0.3 mg by mouth 2 (two) times daily.   Yes Historical Provider, MD  docusate sodium (COLACE) 100 MG capsule Take 100 mg by mouth daily.   Yes Historical Provider, MD  esomeprazole (NEXIUM) 40 MG capsule Take 40 mg by mouth daily before breakfast.   Yes Historical Provider, MD  fenofibrate 160 MG  tablet Take 160 mg by mouth daily.   Yes Historical Provider, MD  hydrochlorothiazide (HYDRODIURIL) 25 MG tablet Take 25 mg by mouth daily.   Yes Historical Provider, MD  losartan (COZAAR) 100 MG tablet Take 100 mg by mouth daily.   Yes Historical Provider, MD  methimazole (TAPAZOLE) 10 MG tablet Take 5 mg by mouth 3 (three) times a week.    Yes Historical Provider, MD  metoprolol (LOPRESSOR) 50 MG tablet Take 50 mg by mouth daily.   Yes Historical Provider, MD  pravastatin (PRAVACHOL) 80 MG tablet Take 80 mg by mouth daily.   Yes Historical Provider, MD  Probiotic Product (PROBIOTIC FORMULA PO) Take 1 tablet by mouth daily.   Yes Historical Provider, MD     Positive ROS: none  All other systems have been reviewed and were otherwise negative with the exception of those mentioned in the HPI and as above.  Physical Exam: Filed Vitals:   11/14/11 1044  BP: 134/84  Pulse: 72  Temp: 98 F (36.7 C)  Resp: 18    General: Alert, no acute distress Cardiovascular: No pedal edema Respiratory: No cyanosis, no use of accessory musculature GI: No organomegaly, abdomen is soft and non-tender Skin: No lesions in the area of chief complaint Neurologic:  Sensation intact distally Psychiatric: Patient is competent for consent with normal mood and affect Lymphatic: No axillary or cervical lymphadenopathy  MUSCULOSKELETAL: l.hip pain on ROM // NVI distally.  Limited pain on ROM.   Assessment/Plan: DEGENERATIVE JOINT DISEASE left hip Plan for Procedure(s): TOTAL HIP ARTHROPLASTY left  The risks benefits and alternatives were discussed with the patient including but not limited to the risks of nonoperative treatment, versus surgical intervention including infection, bleeding, nerve injury, malunion, nonunion, hardware prominence, hardware failure, need for hardware removal, blood clots, cardiopulmonary complications, morbidity, mortality, among others, and they were willing to proceed.  Predicted  outcome is good, although there will be at least a six to nine month expected recovery.  Harvie Junior, MD 11/14/2011 12:58 PM

## 2011-11-14 NOTE — Progress Notes (Signed)
ANTICOAGULATION CONSULT NOTE - Initial Consult  Pharmacy Consult for Coumadin Indication: VTE prophylaxis  No Known Allergies  Patient Measurements: Height: 5' (152.4 cm) (on 11/10/11) Weight: 168 lb 6.4 oz (76.386 kg) (on 11/10/11) IBW/kg (Calculated) : 45.5    Vital Signs: Temp: 97.3 F (36.3 C) (05/13 1830) Temp src: Oral (05/13 1044) BP: 125/62 mmHg (05/13 1830) Pulse Rate: 86  (05/13 1830)  Labs: [facility] Labs on 11/10/11: PTT 31,   PT 13.2,  INR 0.98,  H/H 12.2/36.1,  pltc 215K  No results found for this basename: HGB:2,HCT:3,PLT:3,APTT:3,LABPROT:3,INR:3,HEPARINUNFRC:3,CREATININE:3,CKTOTAL:3,CKMB:3,TROPONINI:3 in the last 72 hours  Estimated Creatinine Clearance: 49 ml/min (by C-G formula based on Cr of 0.99).   Medical History: Past Medical History  Diagnosis Date  . PONV (postoperative nausea and vomiting)   . Hyperthyroidism   . Hypertension   . GERD (gastroesophageal reflux disease)   . Arthritis   . 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 Constipation   . Diverticulosis of colon   . Renal cyst, congenital, right   . Nocturia   . Hyperlipidemia     Medications:  Prescriptions prior to admission  Medication Sig Dispense Refill  . amLODipine (NORVASC) 10 MG tablet Take 10 mg by mouth daily.      . Calcium Carbonate-Vitamin D (CALCIUM 600 + D PO) Take 1 tablet by mouth daily.      . cloNIDine (CATAPRES) 0.3 MG tablet Take 0.3 mg by mouth 2 (two) times daily.      Marland Kitchen docusate sodium (COLACE) 100 MG capsule Take 100 mg by mouth daily.      Marland Kitchen esomeprazole (NEXIUM) 40 MG capsule Take 40 mg by mouth daily before breakfast.      . fenofibrate 160 MG tablet Take 160 mg by mouth daily.      . hydrochlorothiazide (HYDRODIURIL) 25 MG tablet Take 25 mg by mouth daily.      Marland Kitchen losartan (COZAAR) 100 MG tablet Take 100 mg by mouth daily.      . methimazole (TAPAZOLE) 10 MG tablet Take 5 mg by mouth 3 (three) times a week.       . metoprolol (LOPRESSOR) 50 MG  tablet Take 50 mg by mouth daily.      . pravastatin (PRAVACHOL) 80 MG tablet Take 80 mg by mouth daily.      . Probiotic Product (PROBIOTIC FORMULA PO) Take 1 tablet by mouth daily.       Scheduled:    . acetaminophen  1,000 mg Intravenous Q6H  . amLODipine  10 mg Oral Daily  .  ceFAZolin (ANCEF) IV  2 g Intravenous 60 min Pre-Op  .  ceFAZolin (ANCEF) IV  2 g Intravenous Q6H  . cloNIDine  0.3 mg Oral BID  . docusate sodium  100 mg Oral BID  . fenofibrate  160 mg Oral Daily  . ferrous sulfate  325 mg Oral BID WC  . hydrochlorothiazide  25 mg Oral Daily  . HYDROmorphone      . ketorolac  15 mg Intravenous Q8H  . losartan  100 mg Oral Daily  . methimazole  5 mg Oral 3 times weekly  . metoprolol  50 mg Oral Daily  . pantoprazole  80 mg Oral Q1200  . simvastatin  40 mg Oral q1800  . DISCONTD: povidone-iodine   Topical Once    Assessment: 71 y.o. Female s/p Left THA to start on coumadin for VTE prophylaxis. Baseline  INR 0.98,  H/H 12.2/36.1,  pltc 215K. No  h/ol bleeding complications noted.   Goal of Therapy:  INR ~ 2.0    Plan:  Coumadin 5mg  po tonight. INR qAM Coumadin education book/video ordered.  Noah Delaine, RPh Clinical Pharmacist 11/14/2011,8:06 PM

## 2011-11-15 ENCOUNTER — Encounter (HOSPITAL_COMMUNITY): Payer: Self-pay | Admitting: Orthopedic Surgery

## 2011-11-15 LAB — BASIC METABOLIC PANEL
Chloride: 103 mEq/L (ref 96–112)
Creatinine, Ser: 1.26 mg/dL — ABNORMAL HIGH (ref 0.50–1.10)
GFR calc Af Amer: 49 mL/min — ABNORMAL LOW (ref >90)
Potassium: 3.6 mEq/L (ref 3.5–5.1)
Sodium: 138 mEq/L (ref 135–145)

## 2011-11-15 LAB — CBC
Platelets: 194 10*3/uL (ref 150–400)
RDW: 14.2 % (ref 11.5–15.5)
WBC: 9.6 10*3/uL (ref 4.0–10.5)

## 2011-11-15 LAB — PROTIME-INR
INR: 1.18 (ref 0.00–1.49)
Prothrombin Time: 15.2 seconds (ref 11.6–15.2)

## 2011-11-15 MED ORDER — ATORVASTATIN CALCIUM 20 MG PO TABS
20.0000 mg | ORAL_TABLET | Freq: Every day | ORAL | Status: DC
  Administered 2011-11-15 – 2011-11-16 (×2): 20 mg via ORAL
  Filled 2011-11-15 (×3): qty 1

## 2011-11-15 MED ORDER — BIOTENE DRY MOUTH MT LIQD
15.0000 mL | Freq: Two times a day (BID) | OROMUCOSAL | Status: DC
  Administered 2011-11-15 – 2011-11-17 (×3): 15 mL via OROMUCOSAL

## 2011-11-15 MED ORDER — WARFARIN SODIUM 5 MG PO TABS
5.0000 mg | ORAL_TABLET | Freq: Once | ORAL | Status: AC
  Administered 2011-11-15: 5 mg via ORAL
  Filled 2011-11-15: qty 1

## 2011-11-15 MED ORDER — PATIENT'S GUIDE TO USING COUMADIN BOOK
Freq: Once | Status: AC
  Administered 2011-11-15: 17:00:00
  Filled 2011-11-15: qty 1

## 2011-11-15 NOTE — Op Note (Signed)
Jackie Lynch, MUSA NO.:  [id number]  MEDICAL RECORD NO.:  [id number]  LOCATION:  5018                         FACILITY:  [facility]  PHYSICIAN:  Harvie Junior, M.D.   DATE OF BIRTH:  1941/05/25  DATE OF PROCEDURE:  11/14/2011 DATE OF DISCHARGE:                              OPERATIVE REPORT   PREOPERATIVE DIAGNOSIS:  End-stage degenerative joint disease of left hip.  POSTOPERATIVE DIAGNOSIS:  End-stage degenerative joint disease of left hip.  PROCEDURE:  Left total hip replacement with a __________.   Dictation ended at this point.     Harvie Junior, M.D.     Ranae Plumber  D:  11/14/2011  T:  11/15/2011  Job:  [id number]

## 2011-11-15 NOTE — Progress Notes (Signed)
ANTICOAGULATION CONSULT NOTE - Follow Up Consult  Pharmacy Consult for Coumadin Indication: VTE prophylaxis  No Known Allergies  Patient Measurements: Height: 5' (152.4 cm) (on 11/10/11) Weight: 168 lb 6.4 oz (76.386 kg) (on 11/10/11) IBW/kg (Calculated) : 45.5  Heparin Dosing Weight:   Vital Signs: Temp: 97.7 F (36.5 C) (05/14 0534) Temp src: Oral (05/14 0534) BP: 91/50 mmHg (05/14 0942) Pulse Rate: 65  (05/14 0942)  Labs:  Basename 11/15/11 0650  HGB 9.3*  HCT 27.6*  PLT 194  APTT --  LABPROT 15.2  INR 1.18  HEPARINUNFRC --  CREATININE 1.26*  CKTOTAL --  CKMB --  TROPONINI --    Estimated Creatinine Clearance: 38.5 ml/min (by C-G formula based on Cr of 1.26).  Assessment: 70yof on Coumadin for VTE prophylaxis s/p THA. INR (1.18) is subtherapeutic as expected after Coumadin 5mg  x 1. Per MD, target INR ~2. - H/H and Plts decreasing post-op - No significant bleeding reported  Goal of Therapy:  INR ~2   Plan:  1. Repeat Coumadin 5mg  po x 1 today 2. Follow-up AM INR   Cleon Dew [phone] 11/15/2011,10:53 AM

## 2011-11-15 NOTE — Progress Notes (Signed)
Agree with evaluation and goals.  11/15/2011 Canesha Tesfaye M Leonce Bale, PT, DPT [phone]   

## 2011-11-15 NOTE — Op Note (Signed)
NAMESHIVALI, QUACKENBUSH NO.:  [id number]  MEDICAL RECORD NO.:  [id number]  LOCATION:  5018                         FACILITY:  [facility]  PHYSICIAN:  Harvie Junior, M.D.   DATE OF BIRTH:  July 11, 1940  DATE OF PROCEDURE:  11/14/2011 DATE OF DISCHARGE:                              OPERATIVE REPORT   PREOPERATIVE DIAGNOSIS:  End-stage degenerative joint disease, left hip.  POSTOPERATIVE DIAGNOSIS:  End-stage degenerative joint disease, left hip.  PROCEDURE:  Left total hip replacement with S-ROM system, size 1613 stem with a 16 D small cone, 36 + 6 offset with a +0 ball.  The socket is a 52 mm socket with a +4, 10 degree neutral liner.  SURGEONS:  Harvie Junior, M.D.  ASSISTANT:  Marshia Ly, P.A.  ANESTHESIA:  General.  BRIEF HISTORY:  Ms. Elsbury is a 71 year old female with a long history of severe pain in her left hip.  She had been treated conservatively for a long period of time, injections had helped her, but these are now failing, and because of continued complaints of pain and bone-on-bone changes on x-ray and failure of all conservative care, she was taken to the operating room for left total hip replacement.  PROCEDURE:  The patient was taken to the operating room.  After adequate anesthesia was induced with general anesthetic, the patient was placed supine on the operating table, then moved into a right lateral decubitus position.  All bony prominences were well padded.  Attention turned to the left hip where after routine prep and drape, an incision was made for posterior approach to the hip.  Subcutaneous tissue down to the level of the extensor fascia, was divided in line with its fibers. Following this, attention was turned to the posterior aspect of the hip. Subcutaneous tissue down to the level of its femoral neck and a provisional neck cut was made.  Short external rotators and piriformis taken down as a group and tagged.  A provisional neck  cut made and attention turned to the socket which was reamed up to a level of 51 and 52 mm, a socket was then placed.  The pinnacle cup and +4 10 degree neutral liner was trialed at this point.  Attention was then turned towards the stem side which was reamed sequentially up to 11 __________ rim was taken down with excellent bone quality and chatter was felt at this point.  __________ was taken down halfway at this point, attention was turned towards conical reaming which was reamed up to the D and then we then went to the D small cone, and once this was achieved, the trial reduction was undertaken with 36+ 6 stem +0 offset, excellent stability, range of motion was achieved at this point.  The trials were removed. The trial liner was taken out of the socket.  The final liner was put in place.  At this point, after the hole eliminator, attention was turned back to the stem where a 16 D small cone was used to get excellent purchase.  This was achieved, and once this was achieved, a 16 x 11 stem was put down in neutral rotation which had been  the version of trial. Once this was completed, attention was turned towards the trial ball against.  We had put a +0 ball and got excellent stability, range of motion, leg lengths were symmetric.  At this point, the trial was dislocated.  The ball was then put in place, hammered in place and then this was accepted.  Copious irrigation was undertaken at this point. The short external rotators and piriformis were repaired to the posterior intertrochanteric line through drill holes and the extensor fascia was closed with 1 Vicryl running, skin with a 2-0 Vicryl and skin staples.  Sterile compressive dressing was applied as well as knee immobilizer.  The patient was taken to the recovery room and she was noted to be in satisfactory condition. Estimated blood loss was 300 mL.     Harvie Junior, M.D.     Ranae Plumber  D:  11/14/2011  T:  11/14/2011   Job:  [id number]

## 2011-11-15 NOTE — Progress Notes (Signed)
Physical Therapy Treatment Patient Details Name: TEESHA OHM MRN: [id number] DOB: Mar 18, 1941 Today's Date: 11/15/2011 Time: 8182-9937 PT Time Calculation (min): 16 min  PT Assessment / Plan / Recommendation Comments on Treatment Session  Pt admitted s/p L THA and is progressing well. Tolerated BID ambulation, walking almost 100 ft this PM. Pt would benefit from continued PT services to facilitate a safe D/C home to HHPT.     Follow Up Recommendations  Home health PT    Barriers to Discharge        Equipment Recommendations  None recommended by PT    Recommendations for Other Services    Frequency 7X/week   Plan Discharge plan remains appropriate;Frequency remains appropriate    Precautions / Restrictions Precautions Precautions: Posterior Hip Precaution Booklet Issued: No Precaution Comments: Pt able to recall 1/3 posterior hip precautions. Reminded of other 2.  Restrictions Weight Bearing Restrictions: Yes LLE Weight Bearing: Weight bearing as tolerated   Pertinent Vitals/Pain Pt denies any pain.      Mobility  Bed Mobility Bed Mobility: Supine to Sit;Sit to Supine Supine to Sit: 4: Min assist Sit to Supine: 4: Min assist Details for Bed Mobility Assistance: Assist with bringing L LE to EOB. Cues for sequencing.  Transfers Transfers: Sit to Stand;Stand to Sit Sit to Stand: 4: Min guard Stand to Sit: 4: Min guard Details for Transfer Assistance: Guard for balance. Cues for sequence, placement of L LE.  Ambulation/Gait Ambulation/Gait Assistance: 4: Min guard Ambulation Distance (Feet): 90 Feet Assistive device: Rolling walker Ambulation/Gait Assistance Details: Guard for balance. Cues for tall posture, step through gait patterrn, and L LE placement.  Gait Pattern: Step-to pattern;Step-through pattern;Decreased step length - left;Decreased stance time - left Stairs: No Wheelchair Mobility Wheelchair Mobility: No    Exercises     PT Diagnosis:    PT Problem  List:   PT Treatment Interventions:     PT Goals Acute Rehab PT Goals PT Goal Formulation: With patient Time For Goal Achievement: 11/22/11 Potential to Achieve Goals: Good PT Goal: Supine/Side to Sit - Progress: Progressing toward goal PT Goal: Sit to Supine/Side - Progress: Progressing toward goal PT Goal: Sit to Stand - Progress: Progressing toward goal PT Goal: Ambulate - Progress: Progressing toward goal  Visit Information  Last PT Received On: 11/15/11 Assistance Needed: +1    Subjective Data  Subjective: "I got up to the bathroom earlier." Patient Stated Goal: Go home.   Cognition  Overall Cognitive Status: Appears within functional limits for tasks assessed/performed Arousal/Alertness: Awake/alert Orientation Level: Appears intact for tasks assessed Behavior During Session: [facility] for tasks performed    Balance  Balance Balance Assessed: No  End of Session PT - End of Session Equipment Utilized During Treatment: Gait belt Activity Tolerance: Patient tolerated treatment well Patient left: in bed;with call bell/phone within reach;with family/visitor present Nurse Communication: Mobility status    Oretha Ellis 11/15/2011, 2:03 PM

## 2011-11-15 NOTE — Progress Notes (Deleted)
CARE MANAGEMENT NOTE 11/15/2011  Patient:  Jackie Lynch   Account Number:  [id number]  Date Initiated:  11/15/2011  Documentation initiated by:  Vance Peper  Subjective/Objective Assessment:   71 yr old female s/p right total knee revision.     Action/Plan:   Spoke with patient regarding Home Health needs at discharge. Choice offered. Patient states she has used [place] and wants to do so now. contacted [facility], [facility] liasion. Patient has rolling walker and 3in1.CPM to be delivered.   Anticipated DC Date:  11/16/2011   Anticipated DC Plan:  HOME W HOME HEALTH SERVICES      DC Planning Services  CM consult      [facility] Choice  HOME HEALTH   Choice offered to / List presented to:  C-1 Patient        HH arranged  HH-1 RN  HH-2 PT      [facility] agency  [facility]   Status of service:  Completed, signed off   Discharge Disposition:  HOME W HOME HEALTH SERVICES

## 2011-11-15 NOTE — Progress Notes (Signed)
Agree with PT treatment session.  11/15/2011 Cephus Shelling, PT, DPT [phone]

## 2011-11-15 NOTE — Discharge Instructions (Signed)
Total Hip Replacement Care After Please read the instructions outlined below. Refer to these instructions for the next few weeks. These discharge instructions provide you with general information on caring for yourself after surgery. Your caregiver may also give you more detailed instructions. If you have any problems or questions after discharge, please call your caregiver. HOME CARE INSTRUCTIONS   It will be normal to be sore for a couple weeks following surgery. See your caregiver if this seems to be getting worse rather than better.   Only take over-the-counter or prescription medicines for pain, discomfort, or fever as directed by your caregiver.   Use showers for bathing, until seen or as instructed.   Change dressings if necessary or as directed.   You may resume normal diet and activities as directed or allowed.   Avoid lifting until you are instructed otherwise.   Make an appointment to see your caregiver for stitch or staple removal when instructed.   You may use ice on your incision for 15 to 20 minutes 3 to 4 times per day for the first two days.   Use a raised toilet seat and avoid sitting in low chairs as instructed.   Use crutches or a walker as instructed.  SEEK MEDICAL CARE IF:  There is redness, swelling, or increasing pain or tenderness in the wound.   There is pus or unusual drainage coming from the wound.   There is a foul smell coming from the wound or dressing.   The wound breaks open (the edges do not stay together) after sutures or staples have been removed.   You have any other questions or concerns.  SEEK IMMEDIATE MEDICAL CARE IF:   You have a fever.   You develop swelling of your calf or leg or there is shortness of breath, difficulty breathing, or chest pain.   You develop a rash.   You develop any reaction or side effects to medications given.   You develop increasing pain in your hip.  MAKE SURE YOU:  Understand these instructions.    Will watch your condition.   Will get help right away if you are not doing well or get worse.  Document Released: 01/07/2005 Document Revised: 06/09/2011 Document Reviewed: 04/22/2009 [facility] Patient Information 2012 [place], [place].   Home Health to be provided by [facility] [phone]

## 2011-11-15 NOTE — Progress Notes (Signed)
Subjective: 1 Day Post-Op Procedure(s) (LRB): TOTAL HIP ARTHROPLASTY (Left) Patient reports pain as 2 on 0-10 scale.   Some nausea.  Objective: Vital signs in last 24 hours: Temp:  [96.8 F (36 C)-98 F (36.7 C)] 97.7 F (36.5 C) (05/14 0534) Pulse Rate:  [55-86] 65  (05/14 0942) Resp:  [8-21] 16  (05/14 0534) BP: (91-158)/(48-84) 91/50 mmHg (05/14 0942) SpO2:  [94 %-100 %] 97 % (05/14 0534) FiO2 (%):  [2 %] 2 % (05/13 2043) Weight:  [76.386 kg (168 lb 6.4 oz)] 76.386 kg (168 lb 6.4 oz) (05/13 1935)  Intake/Output from previous day: 05/13 0701 - 05/14 0700 In: 1390 [P.O.:240; I.V.:1100; IV Piggyback:50] Out: 800 [Urine:600; Blood:200] Intake/Output this shift:     Basename 11/15/11 0650  HGB 9.3*    Basename 11/15/11 0650  WBC 9.6  RBC 3.13*  HCT 27.6*  PLT 194    Basename 11/15/11 0650  NA 138  K 3.6  CL 103  CO2 25  BUN 26*  CREATININE 1.26*  GLUCOSE 131*  CALCIUM 8.4    Basename 11/15/11 0650  LABPT --  INR 1.18  Left hip:  Neurovascular intact Sensation intact distally Intact pulses distally Dorsiflexion/Plantar flexion intact Incision: dressing C/D/I Compartment soft  Assessment/Plan: 1 Day Post-Op Procedure(s) (LRB): TOTAL HIP ARTHROPLASTY (Left) Plan: Up with therapy Discharge home with home health in 1-2 days. Check BMET in AM. Cont coumadin per pharmacy.  Jackie Lynch G 11/15/2011, 9:48 AM

## 2011-11-15 NOTE — Progress Notes (Signed)
CARE MANAGEMENT NOTE 11/15/2011  Patient:  Jackie Lynch, Jackie Lynch   Account Number:  [id number]  Date Initiated:  11/15/2011  Documentation initiated by:  Vance Peper  Subjective/Objective Assessment:   71 yr old female s/p left total hip arthroplasty     Action/Plan:   Spoke with patient regarding home health needs at discharge. Patient states she was preoperatively setup with [place], no changes.Has rolling walker and 3in1. Has family support.   Anticipated DC Date:  11/17/2011   Anticipated DC Plan:  HOME W HOME HEALTH SERVICES      DC Planning Services  CM consult      [facility] Choice  HOME HEALTH   Choice offered to / List presented to:  C-1 Patient        HH arranged  HH-2 PT      [facility] agency  [facility]   Status of service:  Completed, signed off

## 2011-11-15 NOTE — Anesthesia Postprocedure Evaluation (Signed)
  Anesthesia Post-op Note  Patient: Jackie Lynch  Procedure(s) Performed: Procedure(s) (LRB): TOTAL HIP ARTHROPLASTY (Left)  Patient Location: PACU  Anesthesia Type: General  Level of Consciousness: awake  Airway and Oxygen Therapy: Patient Spontanous Breathing  Post-op Pain: mild  Post-op Assessment: Post-op Vital signs reviewed, Patient's Cardiovascular Status Stable, Respiratory Function Stable, Patent Airway, No signs of Nausea or vomiting and Pain level controlled  Post-op Vital Signs: stable  Complications: No apparent anesthesia complications

## 2011-11-15 NOTE — Progress Notes (Signed)
UR COMPLETED  

## 2011-11-15 NOTE — Progress Notes (Signed)
Physical Therapy Evaluation  Past Medical History  Diagnosis Date  . PONV (postoperative nausea and vomiting)   . Hyperthyroidism   . Hypertension   . GERD (gastroesophageal reflux disease)   . Arthritis   . 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 Constipation   . Diverticulosis of colon   . Renal cyst, congenital, right   . Nocturia   . Hyperlipidemia    Past Surgical History  Procedure Date  . Cholecystectomy   . Tubal ligation   . Knee arthroscopy     right and left  . Abdominal hysterectomy      11/15/11 0800  PT Visit Information  Last PT Received On 11/15/11  Assistance Needed +1  PT Time Calculation  PT Start Time 0853  PT Stop Time 0921  PT Time Calculation (min) 28 min  Subjective Data  Subjective "I'm ready to get up."  Patient Stated Goal Go home.  Precautions  Precautions Posterior Hip  Precaution Booklet Issued Yes (comment)  Precaution Comments Educated on 3/3 posterior hip precautions.   Restrictions  Weight Bearing Restrictions Yes  LLE Weight Bearing WBAT  Home Living  Lives With Family  Available Help at Discharge Family  Type of Home House  Home Access Stairs to enter  Entrance Stairs-Number of Steps 2  Entrance Stairs-Rails Can reach both  Home Layout One level  Bathroom Shower/Tub Walk-in shower;Door  Horticulturist, commercial Yes  Home Adaptive Equipment Bedside commode/3-in-1;Walker - rolling;Shower chair with back  Prior Function  Level of Independence Independent  Able to Take Stairs? Yes  Driving Yes  Vocation Part time employment  Communication  Communication No difficulties  Cognition  Overall Cognitive Status Appears within functional limits for tasks assessed/performed  Arousal/Alertness Awake/alert  Orientation Level Appears intact for tasks assessed  Behavior During Session [facility] for tasks performed  Right Upper Extremity Assessment  RUE ROM/Strength/Tone WFL  RUE Sensation WFL -  Light Touch  RUE Coordination WFL - gross/fine motor  Left Upper Extremity Assessment  LUE ROM/Strength/Tone WFL  LUE Sensation WFL - Light Touch  LUE Coordination WFL - gross/fine motor  Right Lower Extremity Assessment  RLE ROM/Strength/Tone WFL  RLE Sensation WFL - Light Touch  RLE Coordination WFL - gross/fine motor  Left Lower Extremity Assessment  LLE ROM/Strength/Tone Due to precautions;WFL for tasks assessed  LLE Sensation WFL - Light Touch  LLE Coordination WFL - gross/fine motor  Trunk Assessment  Trunk Assessment Normal  Bed Mobility  Bed Mobility Supine to Sit  Supine to Sit 4: Min guard  Details for Bed Mobility Assistance Guarding for balance. Cues for sequence and L LE placement.   Transfers  Transfers Sit to Stand;Stand to Sit  Sit to Stand 4: Min guard  Stand to Sit 4: Min guard  Details for Transfer Assistance Guard for balance. Cues for sequence, straightening L LE to sit/stand, and L LE placement.   Ambulation/Gait  Ambulation/Gait Assistance 4: Min guard  Ambulation Distance (Feet) 40 Feet  Assistive device Rolling walker  Ambulation/Gait Assistance Details Guard for balance. Cueing for RW placement, L LE placement, sequencing, and tall posture.   Gait Pattern Step-to pattern;Decreased step length - left;Decreased stance time - left  Stairs No  Wheelchair Mobility  Wheelchair Mobility No  Balance  Balance Assessed No  Exercises  Exercises Total Joint  Total Joint Exercises  Ankle Circles/Pumps AROM;Left;10 reps;Supine  Quad Sets AROM;Left;10 reps;Supine  Heel Slides AROM;Left;10 reps;Supine  PT - End of Session  Equipment Utilized During Treatment Gait belt  Activity Tolerance Patient tolerated treatment well;Patient limited by fatigue  Patient left in chair;with call bell/phone within reach  Nurse Communication Mobility status  PT Assessment  Clinical Impression Statement Pt is a 71 y/o female admitted s/p L THA along with PT problem list below.  Pt ambulated this AM. Pt would benefit from PT to maximize independence for a safe D/C home with HHPT.   PT Recommendation/Assessment Patient needs continued PT services  PT Problem List Decreased strength;Decreased activity tolerance;Decreased mobility;Decreased knowledge of use of DME;Decreased knowledge of precautions;Pain  Barriers to Discharge None  PT Therapy Diagnosis  Difficulty walking;Acute pain  PT Plan  PT Frequency 7X/week  PT Treatment/Interventions DME instruction;Gait training;Stair training;Therapeutic activities;Therapeutic exercise;Patient/family education;Functional mobility training  PT Recommendation  Follow Up Recommendations Home health PT  Equipment Recommended None recommended by PT  Individuals Consulted  Consulted and Agree with Results and Recommendations Patient  Acute Rehab PT Goals  PT Goal Formulation With patient  Time For Goal Achievement 11/22/11  Potential to Achieve Goals Good  Pt will go Supine/Side to Sit with modified independence  PT Goal: Supine/Side to Sit - Progress Goal set today  Pt will go Sit to Supine/Side with modified independence  PT Goal: Sit to Supine/Side - Progress Goal set today  Pt will go Sit to Stand with modified independence  PT Goal: Sit to Stand - Progress Goal set today  Pt will Ambulate >150 feet;with modified independence;with rolling walker  PT Goal: Ambulate - Progress Goal set today  Pt will Go Up / Down Stairs 1-2 stairs;with modified independence;with rail(s)  PT Goal: Up/Down Stairs - Progress Goal set today  Pt will Perform Home Exercise Program with supervision, verbal cues required/provided  PT Goal: Perform Home Exercise Program - Progress Goal set today  Written Expression  Dominant Hand Right    Pain: Pt reports 4/10 pain at L hip. Pt repositioned. RN aware.   Jackie Lynch, SPT

## 2011-11-16 LAB — BASIC METABOLIC PANEL
Calcium: 8.6 mg/dL (ref 8.4–10.5)
Creatinine, Ser: 1.48 mg/dL — ABNORMAL HIGH (ref 0.50–1.10)
GFR calc non Af Amer: 35 mL/min — ABNORMAL LOW (ref >90)
Sodium: 139 mEq/L (ref 135–145)

## 2011-11-16 LAB — URINALYSIS, ROUTINE W REFLEX MICROSCOPIC
Hgb urine dipstick: NEGATIVE
Ketones, ur: NEGATIVE mg/dL
Leukocytes, UA: NEGATIVE
Protein, ur: NEGATIVE mg/dL
Urobilinogen, UA: 0.2 mg/dL (ref 0.0–1.0)

## 2011-11-16 LAB — CBC
MCH: 29.8 pg (ref 26.0–34.0)
MCHC: 33.3 g/dL (ref 30.0–36.0)
MCV: 89.4 fL (ref 78.0–100.0)
Platelets: 176 10*3/uL (ref 150–400)
RDW: 14.4 % (ref 11.5–15.5)

## 2011-11-16 LAB — PROTIME-INR: Prothrombin Time: 25.6 seconds — ABNORMAL HIGH (ref 11.6–15.2)

## 2011-11-16 MED ORDER — SODIUM CHLORIDE 0.9 % IV SOLN
INTRAVENOUS | Status: DC
  Administered 2011-11-16: 09:00:00 via INTRAVENOUS

## 2011-11-16 MED ORDER — POTASSIUM CHLORIDE CRYS ER 20 MEQ PO TBCR
20.0000 meq | EXTENDED_RELEASE_TABLET | Freq: Two times a day (BID) | ORAL | Status: DC
  Administered 2011-11-16 – 2011-11-17 (×3): 20 meq via ORAL
  Filled 2011-11-16 (×5): qty 1

## 2011-11-16 NOTE — Progress Notes (Signed)
Physical Therapy Treatment Patient Details Name: Jackie Lynch MRN: [id number] DOB: June 29, 1941 Today's Date: 11/16/2011 Time: 8469-6295 PT Time Calculation (min): 23 min  PT Assessment / Plan / Recommendation Comments on Treatment Session  Pt admitted s/p L THA and is progressing well. Pt limited by fatigue this PM, only ambulated to bathroom. Will attempt stairs tomorrow morning. Pt will benefit from PT to help facilitate a safe D/C home with HHPT.     Follow Up Recommendations  Home health PT    Barriers to Discharge        Equipment Recommendations  None recommended by PT    Recommendations for Other Services    Frequency 7X/week   Plan Discharge plan remains appropriate;Frequency remains appropriate    Precautions / Restrictions Precautions Precautions: Posterior Hip Precaution Booklet Issued: No Precaution Comments: Pt reminded of 3/3 posterior hip precautions.  Restrictions Weight Bearing Restrictions: Yes LLE Weight Bearing: Weight bearing as tolerated   Pertinent Vitals/Pain Pt reports 4/10 pain at L hip. Pt repositioned. RN aware.     Mobility  Bed Mobility Bed Mobility: Supine to Sit Supine to Sit: 4: Min assist Sit to Supine: 4: Min assist Details for Bed Mobility Assistance: Assist with L LE to bring to EOB and cues for L LE placement. Assist with translating trunk anterior. Cues for sequence.  Transfers Transfers: Sit to Stand;Stand to Sit (2 Trials) Sit to Stand: From bed;From chair/3-in-1;With upper extremity assist;With armrests;4: Min guard Stand to Sit: 4: Min guard;With upper extremity assist;With armrests;To bed;To chair/3-in-1 Details for Transfer Assistance: Guard for balance. Cues for sequence and L LE placement and safest UE placement.  Ambulation/Gait Ambulation/Gait Assistance: 5: Supervision Ambulation Distance (Feet): 40 Feet Assistive device: Rolling walker Ambulation/Gait Assistance Details: Cueing for picking L LE up during gait and step  through pattern as well as tall posture.  Gait Pattern: Step-to pattern;Step-through pattern;Decreased step length - left;Decreased stance time - left Stairs: No Wheelchair Mobility Wheelchair Mobility: No          PT Goals Acute Rehab PT Goals PT Goal Formulation: With patient Time For Goal Achievement: 11/22/11 Potential to Achieve Goals: Good PT Goal: Supine/Side to Sit - Progress: Progressing toward goal PT Goal: Sit to Supine/Side - Progress: Progressing toward goal PT Goal: Sit to Stand - Progress: Progressing toward goal PT Goal: Ambulate - Progress: Progressing toward goal   Visit Information  Last PT Received On: 11/16/11 Assistance Needed: +1    Subjective Data  Subjective: "I'm really tired." Patient Stated Goal: Go home.    Cognition  Overall Cognitive Status: Appears within functional limits for tasks assessed/performed Arousal/Alertness: Awake/alert Orientation Level: Appears intact for tasks assessed Behavior During Session: [facility] for tasks performed    Balance  Balance Balance Assessed: No  End of Session PT - End of Session Equipment Utilized During Treatment: Gait belt Activity Tolerance: Patient tolerated treatment well;Patient limited by fatigue Patient left: in bed;with call bell/phone within reach;with family/visitor present Nurse Communication: Mobility status    Oretha Ellis 11/16/2011, 3:37 PM

## 2011-11-16 NOTE — Progress Notes (Signed)
OT Cancellation Note  Treatment cancelled today due to pt. declining therapy at this time. Attempted x3 and will attempt as time allow.s.  Arielle Eber, OTR/L Pager [phone] 11/16/2011, 2:37 PM

## 2011-11-16 NOTE — Progress Notes (Signed)
Agree with treatment session.  11/16/2011 Rambo Sarafian M Kareemah Grounds, PT, DPT [phone]   

## 2011-11-16 NOTE — Progress Notes (Signed)
Agree with session.  11/16/2011 Cephus Shelling, PT, DPT [phone]

## 2011-11-16 NOTE — Progress Notes (Signed)
ANTICOAGULATION CONSULT NOTE - Follow Up Consult  Pharmacy Consult for Coumadin Indication: VTE prophylaxis  No Known Allergies  Patient Measurements: Height: 5' (152.4 cm) (on 11/10/11) Weight: 168 lb 6.4 oz (76.386 kg) (on 11/10/11) IBW/kg (Calculated) : 45.5  Heparin Dosing Weight:   Vital Signs: Temp: 98.9 F (37.2 C) (05/15 0539) Temp src: Oral (05/15 0539) BP: 123/54 mmHg (05/15 0539) Pulse Rate: 78  (05/15 0539)  Labs:  Basename 11/16/11 0550 11/15/11 0650  HGB 9.0* 9.3*  HCT 27.0* 27.6*  PLT 176 194  APTT -- --  LABPROT 25.6* 15.2  INR 2.29* 1.18  HEPARINUNFRC -- --  CREATININE 1.48* 1.26*  CKTOTAL -- --  CKMB -- --  TROPONINI -- --    Estimated Creatinine Clearance: 32.8 ml/min (by C-G formula based on Cr of 1.48).  Assessment: 70yof on Coumadin for VTE prophylaxis s/p THA. INR (2.29) is therapeutic but significantly increased after Coumadin 5mg  x 2. With large increase and target INR per MD ~2, will hold Coumadin tonight and follow-up AM INR. - H/H and Plts trended down - No significant bleeding reported  Goal of Therapy:  INR ~2 (per MD)   Plan:  1. No Coumadin tonight 2. Follow-up AM INR  Cleon Dew [phone] 11/16/2011,9:58 AM

## 2011-11-16 NOTE — Progress Notes (Signed)
Physical Therapy Treatment Patient Details Name: Jackie Lynch MRN: [id number] DOB: 1941-03-19 Today's Date: 11/16/2011 Time: 1011-1040 PT Time Calculation (min): 29 min  PT Assessment / Plan / Recommendation Comments on Treatment Session  Pt admitted s/p L THA and is progressing well. Ambulated >100 ft this AM. Will attempt stairs this PM. Pt would benefit from continued PT to facilitate a safe D/C home to HHPT.     Follow Up Recommendations  Home health PT    Barriers to Discharge        Equipment Recommendations  None recommended by PT    Recommendations for Other Services    Frequency 7X/week   Plan Discharge plan remains appropriate;Frequency remains appropriate    Precautions / Restrictions Precautions Precautions: Posterior Hip Precaution Booklet Issued: No Precaution Comments: Pt able to recall 1/3 posterior hip precautions and is reminded of other two.  Restrictions Weight Bearing Restrictions: Yes LLE Weight Bearing: Weight bearing as tolerated   Pertinent Vitals/Pain Pt reports no pain in L hip. Pt repositioned.     Mobility  Bed Mobility Bed Mobility: Supine to Sit Supine to Sit: 4: Min assist Details for Bed Mobility Assistance: Assist with bringing L LE to EOB. Cues for both UE placement and sequencing.  Transfers Transfers:  (3 trials) Sit to Stand: 4: Min guard;From bed;From chair/3-in-1;With upper extremity assist;With armrests Stand to Sit: 4: Min guard;With upper extremity assist;With armrests;To chair/3-in-1 Details for Transfer Assistance: Guard for balance. Cues for sequence, both UE placement and pushing up from the bed first Ambulation/Gait Ambulation/Gait Assistance: 4: Min guard Ambulation Distance (Feet): 120 Feet Assistive device: Rolling walker Ambulation/Gait Assistance Details: Guard for balance. Cues for tall posture and step through gait pattern Gait Pattern: Step-to pattern;Step-through pattern;Decreased step length - left;Decreased  stance time - left Stairs: No Wheelchair Mobility Wheelchair Mobility: No    Exercises Total Joint Exercises Ankle Circles/Pumps: AROM;Left;10 reps;Supine Quad Sets: AROM;Left;10 reps;Supine Short Arc Quad: AROM;Left;10 reps;Supine Heel Slides: AROM;Left;10 reps;Supine Hip ABduction/ADduction: AAROM;Left;10 reps;Supine   PT Diagnosis:    PT Problem List:   PT Treatment Interventions:     PT Goals Acute Rehab PT Goals PT Goal Formulation: With patient Time For Goal Achievement: 11/22/11 Potential to Achieve Goals: Good PT Goal: Supine/Side to Sit - Progress: Progressing toward goal PT Goal: Sit to Stand - Progress: Progressing toward goal PT Goal: Ambulate - Progress: Progressing toward goal PT Goal: Perform Home Exercise Program - Progress: Progressing toward goal  Visit Information  Last PT Received On: 11/16/11 Assistance Needed: +1    Subjective Data  Subjective: "I'm not as sore as yesterday." Patient Stated Goal: Go home.    Cognition  Overall Cognitive Status: Appears within functional limits for tasks assessed/performed Arousal/Alertness: Awake/alert Orientation Level: Appears intact for tasks assessed Behavior During Session: [facility] for tasks performed    Balance  Balance Balance Assessed: No  End of Session PT - End of Session Equipment Utilized During Treatment: Gait belt Activity Tolerance: Patient tolerated treatment well Patient left: in chair;with call bell/phone within reach Nurse Communication: Mobility status    Oretha Ellis 11/16/2011, 12:16 PM

## 2011-11-16 NOTE — Progress Notes (Signed)
Subjective: 2 Days Post-Op Procedure(s) (LRB): TOTAL HIP ARTHROPLASTY (Left) Patient reports pain as 1 on 0-10 scale.   Groggy from pain meds.  Objective: Vital signs in last 24 hours: Temp:  [97.7 F (36.5 C)-98.9 F (37.2 C)] 98.9 F (37.2 C) (05/15 0539) Pulse Rate:  [65-88] 78  (05/15 0539) Resp:  [16] 16  (05/15 0539) BP: (91-127)/(50-66) 123/54 mmHg (05/15 0539) SpO2:  [95 %-99 %] 96 % (05/15 0539)  Intake/Output from previous day: 05/14 0701 - 05/15 0700 In: 1160 [P.O.:1160] Out: -  Intake/Output this shift:     Basename 11/16/11 0550 11/15/11 0650  HGB 9.0* 9.3*    Basename 11/16/11 0550 11/15/11 0650  WBC 9.0 9.6  RBC 3.02* 3.13*  HCT 27.0* 27.6*  PLT 176 194    Basename 11/16/11 0550 11/15/11 0650  NA 139 138  K 3.1* 3.6  CL 102 103  CO2 24 25  BUN 31* 26*  CREATININE 1.48* 1.26*  GLUCOSE 118* 131*  CALCIUM 8.6 8.4    Basename 11/16/11 0550 11/15/11 0650  LABPT -- --  INR 2.29* 1.18   Left hip: Neurovascular intact Sensation intact distally Intact pulses distally Dorsiflexion/Plantar flexion intact Incision: dressing C/D/I Compartment soft  Assessment/Plan: 2 Days Post-Op Procedure(s) (LRB): TOTAL HIP ARTHROPLASTY (Left) Renal insufficiency Hypokalemia Plan: Up with therapy Plan for discharge tomorrow IVF today at 8ml/hr Add K+ orally. Check BMET in am. Ashle Stief G 11/16/2011, 8:26 AM

## 2011-11-17 DIAGNOSIS — N289 Disorder of kidney and ureter, unspecified: Secondary | ICD-10-CM | POA: Diagnosis not present

## 2011-11-17 DIAGNOSIS — E876 Hypokalemia: Secondary | ICD-10-CM | POA: Diagnosis not present

## 2011-11-17 LAB — BASIC METABOLIC PANEL
BUN: 24 mg/dL — ABNORMAL HIGH (ref 6–23)
Chloride: 108 mEq/L (ref 96–112)
GFR calc Af Amer: 56 mL/min — ABNORMAL LOW (ref >90)
GFR calc non Af Amer: 48 mL/min — ABNORMAL LOW (ref >90)
Potassium: 3.9 mEq/L (ref 3.5–5.1)
Sodium: 139 mEq/L (ref 135–145)

## 2011-11-17 LAB — PROTIME-INR
INR: 3.48 — ABNORMAL HIGH (ref 0.00–1.49)
Prothrombin Time: 35.5 seconds — ABNORMAL HIGH (ref 11.6–15.2)

## 2011-11-17 LAB — URINE CULTURE: Culture: NO GROWTH

## 2011-11-17 LAB — CBC
HCT: 24.6 % — ABNORMAL LOW (ref 36.0–46.0)
MCHC: 33.3 g/dL (ref 30.0–36.0)
RDW: 14.6 % (ref 11.5–15.5)
WBC: 6.9 10*3/uL (ref 4.0–10.5)

## 2011-11-17 MED ORDER — COUMADIN BOOK: 5.0000 | Freq: Once | Status: DC

## 2011-11-17 MED ORDER — OXYCODONE-ACETAMINOPHEN 5-325 MG PO TABS: 1.0000 | ORAL_TABLET | ORAL | Status: AC | PRN

## 2011-11-17 MED ORDER — WARFARIN SODIUM 5 MG PO TABS: 5.0000 mg | ORAL_TABLET | Freq: Every day | ORAL | Status: DC

## 2011-11-17 MED ORDER — FERROUS SULFATE 325 (65 FE) MG PO TABS: 325.0000 mg | ORAL_TABLET | Freq: Two times a day (BID) | ORAL | Status: DC

## 2011-11-17 MED ORDER — METHOCARBAMOL 500 MG PO TABS: 500.0000 mg | ORAL_TABLET | Freq: Four times a day (QID) | ORAL | Status: AC | PRN

## 2011-11-17 NOTE — Progress Notes (Addendum)
Subjective: 3 Days Post-Op Procedure(s) (LRB): TOTAL HIP ARTHROPLASTY (Left) Patient reports pain as moderate.   Pt has had elevated serum creatinine which we have been treating with fluid and observation   Objective: Vital signs in last 24 hours: Temp:  [98.5 F (36.9 C)-99.3 F (37.4 C)] 99.3 F (37.4 C) (05/16 0540) Pulse Rate:  [69-93] 81  (05/16 0540) Resp:  [16-20] 16  (05/16 0540) BP: (97-137)/(51-57) 110/57 mmHg (05/16 0540) SpO2:  [92 %-98 %] 98 % (05/16 0540)  Intake/Output from previous day: 05/15 0701 - 05/16 0700 In: 1935 [P.O.:360; I.V.:1575] Out: -  Intake/Output this shift:     Basename 11/17/11 0545 11/16/11 0550 11/15/11 0650  HGB 8.2* 9.0* 9.3*    Basename 11/17/11 0545 11/16/11 0550  WBC 6.9 9.0  RBC 2.72* 3.02*  HCT 24.6* 27.0*  PLT 147* 176    Basename 11/17/11 0545 11/16/11 0550  NA 139 139  K 3.9 3.1*  CL 108 102  CO2 25 24  BUN 24* 31*  CREATININE 1.13* 1.48*  GLUCOSE 115* 118*  CALCIUM 8.3* 8.6    Basename 11/17/11 0545 11/16/11 0550  LABPT -- --  INR 3.48* 2.29*   Well-developed well-nourished patient in no acute distress. Alert and oriented x3 HEENT:within normal limits Cardiac: Regular rate and rhythm Pulmonary: Lungs clear to auscultation Abdomen: Soft and nontender.  Normal active bowel sounds  Musculoskeletal: (hip with min pain on ROM) Neurologically intact ABD soft Neurovascular intact Sensation intact distally Intact pulses distally Dorsiflexion/Plantar flexion intact No cellulitis present Compartment soft  Assessment/Plan: 3 Days Post-Op Procedure(s) (LRB): TOTAL HIP ARTHROPLASTY (Left) Hypokalemia - resolving Renal insufficiency-resolved Plan: Pt with elevated creatinine which has resolved Advance diet Up with therapy Discharge home with home health  Elexus Barman L 11/17/2011, 7:58 AM

## 2011-11-17 NOTE — Progress Notes (Signed)
Agree with treatment session.  11/17/2011 Jamale Spangler M Niccolo Burggraf, PT, DPT [phone]   

## 2011-11-17 NOTE — Progress Notes (Signed)
Physical Therapy Treatment Patient Details Name: MARCILE FUQUAY MRN: [id number] DOB: [birth year] Today's Date: 11/17/2011 Time: 8119-1478 PT Time Calculation (min): 33 min  PT Assessment / Plan / Recommendation Comments on Treatment Session  Pt admitted s/p L THA and is progressing well. Pt tolerated greater distance in ambulation and 2 trials of stair training. Pt is ready for a safe D/C home with HHPT once medically cleared by MD.     Follow Up Recommendations  Home health PT    Barriers to Discharge        Equipment Recommendations  None recommended by PT    Recommendations for Other Services    Frequency 7X/week   Plan Discharge plan remains appropriate;Frequency remains appropriate    Precautions / Restrictions Precautions Precautions: Posterior Hip Precaution Booklet Issued: No Precaution Comments: Pt reminded of 3/3 posterior hip precautions.  Restrictions Weight Bearing Restrictions: Yes LLE Weight Bearing: Weight bearing as tolerated   Pertinent Vitals/Pain Pt reports 4/10 pain at L hip. RN aware and pt repositioned.      Mobility  Bed Mobility Bed Mobility: Supine to Sit Supine to Sit: 5: Supervision Details for Bed Mobility Assistance: Cues for sequencing and hand placement.  Transfers Transfers: Sit to Stand;Stand to Sit (Trials x 2) Sit to Stand: 5: Supervision;With upper extremity assist;From bed;From chair/3-in-1;With armrests Stand to Sit: 5: Supervision;With upper extremity assist;With armrests;To chair/3-in-1 Details for Transfer Assistance: Cues for sequence and safe hand placement.  Ambulation/Gait Ambulation/Gait Assistance: 5: Supervision Ambulation Distance (Feet): 180 Feet Assistive device: Rolling walker Ambulation/Gait Assistance Details: Cueing for tall posture and step through pattern.  Gait Pattern: Step-to pattern;Step-through pattern;Decreased step length - left;Decreased stance time - left Stairs: Yes Stairs Assistance: 4: Min guard  (Guard for balance. Cues for sequence and L LE placement. ) Stair Management Technique: Two rails;Step to pattern;Forwards Number of Stairs: 2  (Trials x 2) Wheelchair Mobility Wheelchair Mobility: No    Exercises     PT Diagnosis:    PT Problem List:   PT Treatment Interventions:     PT Goals Acute Rehab PT Goals PT Goal Formulation: With patient Time For Goal Achievement: 11/22/11 Potential to Achieve Goals: Good PT Goal: Supine/Side to Sit - Progress: Progressing toward goal PT Goal: Sit to Stand - Progress: Progressing toward goal PT Goal: Ambulate - Progress: Progressing toward goal PT Goal: Up/Down Stairs - Progress: Progressing toward goal  Visit Information  Last PT Received On: 11/17/11 Assistance Needed: +1    Subjective Data  Subjective: "I'm ready to go." Patient Stated Goal: Go home.    Cognition  Overall Cognitive Status: Appears within functional limits for tasks assessed/performed Arousal/Alertness: Awake/alert Orientation Level: Appears intact for tasks assessed Behavior During Session: [facility] for tasks performed    Balance  Balance Balance Assessed: No  End of Session PT - End of Session Equipment Utilized During Treatment: Gait belt Activity Tolerance: Patient tolerated treatment well Patient left: in chair;with call bell/phone within reach;with family/visitor present Nurse Communication: Mobility status    Oretha Ellis 11/17/2011, 9:10 AM

## 2011-11-17 NOTE — Progress Notes (Signed)
Occupational Therapy Evaluation Patient Details Name: Jackie Lynch MRN: [id number] DOB: 11/12/1940 Today's Date: 11/17/2011 Time: 4098-1191 OT Time Calculation (min): 15 min  OT Assessment / Plan / Recommendation Clinical Impression  71 yo s/p L THA. Pt will benefit from skilled OT services to increase indep with ADL, following post THP, with nec DME and AE.    OT Assessment  Patient needs continued OT Services    Follow Up Recommendations  No OT follow up    Barriers to Discharge None    Equipment Recommendations  None recommended by OT    Recommendations for Other Services  none  Frequency  Min 2X/week    Precautions / Restrictions Precautions Precautions: Posterior Hip Precaution Booklet Issued: No Precaution Comments: stated 3/3 precautions Restrictions Weight Bearing Restrictions: Yes LLE Weight Bearing: Weight bearing as tolerated   Pertinent Vitals/Pain Tooth ache feeling    ADL  Eating/Feeding: Simulated;Independent Grooming: Performed;Supervision/safety Where Assessed - Grooming: Unsupported standing Upper Body Bathing: Simulated;Set up Lower Body Bathing: Simulated;Moderate assistance Where Assessed - Lower Body Bathing: Supported sit to stand Upper Body Dressing: Simulated;Set up Where Assessed - Upper Body Dressing: Unsupported sitting Lower Body Dressing: Performed;Moderate assistance Where Assessed - Lower Body Dressing: Sopported sit to stand Toilet Transfer: Performed;Supervision/safety;Other (comment) (S from husband) Acupuncturist: Bedside commode;Other (comment) (over toilet) Toileting - Clothing Manipulation and Hygiene: Performed;Supervision/safety Where Assessed - Engineer, mining and Hygiene: Standing Tub/Shower Transfer: Performed;Independent Tub/Shower Transfer Method: Science writer: Other (comment) (3 in 1) Equipment Used: Gait belt;Reacher;Long-handled sponge;Long-handled shoe  horn;Rolling walker;Sock aid Transfers/Ambulation Related to ADLs: S ADL Comments: A for LB ADL. No knowledge of AE.    OT Diagnosis: Generalized weakness;Acute pain  OT Problem List: Decreased strength;Decreased knowledge of use of DME or AE;Decreased knowledge of precautions;Pain OT Treatment Interventions: Self-care/ADL training;DME and/or AE instruction;Therapeutic activities;Patient/family education   OT Goals Acute Rehab OT Goals OT Goal Formulation: With patient Time For Goal Achievement: 11/17/11 Potential to Achieve Goals: Good ADL Goals Additional ADL Goal #1: PT/husband indiep with use of AE for LB ADL. ADL Goal: Additional Goal #1 - Progress: Goal set today Additional ADL Goal #2: Pt able to deemonstrate hip precautions during ADL session with cuing of husband. ADL Goal: Additional Goal #2 - Progress: Goal set today  Visit Information  Last OT Received On: 11/17/11 Assistance Needed: +1    Subjective Data   I'm going home today.   Prior Functioning  Home Living Lives With: Family Available Help at Discharge: Family Type of Home: House Home Access: Stairs to enter Secretary/administrator of Steps: 2 Entrance Stairs-Rails: Can reach both Home Layout: One level Bathroom Shower/Tub: Walk-in shower;Door [employer] Toilet: Standard Bathroom Accessibility: Yes How Accessible: Accessible via walker Home Adaptive Equipment: Bedside commode/3-in-1;Walker - rolling;Shower chair with back Prior Function Level of Independence: Independent Able to Take Stairs?: Yes Driving: Yes Vocation: Part time employment Comments: ins Communication Communication: No difficulties Dominant Hand: Right    Cognition  Overall Cognitive Status: Appears within functional limits for tasks assessed/performed Arousal/Alertness: Awake/alert Orientation Level: Appears intact for tasks assessed Behavior During Session: [facility] for tasks performed    Extremity/Trunk Assessment Right Upper  Extremity Assessment RUE ROM/Strength/Tone: Within functional levels Left Upper Extremity Assessment LUE ROM/Strength/Tone: Within functional levels   Mobility Bed Mobility Bed Mobility: Not assessed Transfers Transfers:  [name] for ADL) Sit to Stand: 6: Modified independent (Device/Increase time) Stand to Sit: 6: Modified independent (Device/Increase time)   Exercise Total Joint Exercises Ankle Circles/Pumps: AROM;Left;10  reps;Supine Quad Sets: AROM;Left;10 reps;Supine Short Arc Quad: AROM;Left;10 reps;Supine Heel Slides: AROM;Left;10 reps;Supine Hip ABduction/ADduction: Left;10 reps;Supine;AROM;Standing Long Arc Quad: AROM;Left;10 reps;Supine Knee Flexion: AROM;Left;10 reps;Standing Marching in Standing: AROM;Left;10 reps;Standing Standing Hip Extension: AROM;Left;10 reps;Standing  Balance Balance Balance Assessed: No  End of Session OT - End of Session Equipment Utilized During Treatment: Gait belt Activity Tolerance: Patient tolerated treatment well Patient left: in chair;with call bell/phone within reach;with family/visitor present Nurse Communication: Other (comment) (D/C status)   Jackie Lynch,Jackie Lynch 11/17/2011, 1:59 PM [facility], OTR/L  [phone] 11/17/2011

## 2011-11-17 NOTE — Discharge Summary (Signed)
Patient ID: Jackie Lynch MRN: [id number] DOB/AGE: 70-Jun-1942 70 y.o.  Admit date: 11/14/2011 Discharge date: 11/17/2011  Admission Diagnoses:  Principal Problem:  *Osteoarthritis of left hip Active Problems:  Hypokalemia  Renal insufficiency   Discharge Diagnoses:  Same  Past Medical History  Diagnosis Date  . PONV (postoperative nausea and vomiting)   . Hyperthyroidism   . Hypertension   . GERD (gastroesophageal reflux disease)   . Arthritis   . 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 Constipation   . Diverticulosis of colon   . Renal cyst, congenital, right   . Nocturia   . Hyperlipidemia     Surgeries: Procedure(s):Left TOTAL HIP ARTHROPLASTY on 11/14/2011    Discharged Condition: Improved  Hospital Course: Jackie Lynch is an 71 y.o. female who was admitted 11/14/2011 for operative treatment ofOsteoarthritis of left hip. Patient has severe unremitting pain that affects sleep, daily activities, and work/hobbies. After pre-op clearance the patient was taken to the operating room on 11/14/2011 and underwent  Procedure(s): TOTAL HIP ARTHROPLASTY.    Patient was given perioperative antibiotics: Anti-infectives     Start     Dose/Rate Route Frequency Ordered Stop   11/14/11 1930   ceFAZolin (ANCEF) IVPB 2 g/50 mL premix        2 g 100 mL/hr over 30 Minutes Intravenous Every 6 hours 11/14/11 1843 11/15/11 0817   11/13/11 1456   ceFAZolin (ANCEF) IVPB 2 g/50 mL premix        2 g 100 mL/hr over 30 Minutes Intravenous 60 min pre-op 11/13/11 1456 11/14/11 1316           Patient was given sequential compression devices, early ambulation, and chemoprophylaxis to prevent DVT.  Patient benefited maximally from hospital stay and there were no complications.    Recent vital signs: Patient Vitals for the past 24 hrs:  BP Temp Pulse Resp SpO2  11/17/11 0540 110/57 mmHg 99.3 F (37.4 C) 81  16  98 %  11/20/11 2225 137/56 mmHg 98.6 F (37 C) 91  20  96 %   2011/11/20 1300 117/51 mmHg 98.5 F (36.9 C) 93  17  93 %  2011/11/20 0940 97/53 mmHg - 69  - 92 %     Recent laboratory studies:  Basename 11/17/11 0545 11/20/2011 0550  WBC 6.9 9.0  HGB 8.2* 9.0*  HCT 24.6* 27.0*  PLT 147* 176  NA 139 139  K 3.9 3.1*  CL 108 102  CO2 25 24  BUN 24* 31*  CREATININE 1.13* 1.48*  GLUCOSE 115* 118*  INR 3.48* 2.29*  CALCIUM 8.3* --     Discharge Medications:   Medication List  As of 11/17/2011  8:22 AM   TAKE these medications         amLODipine 10 MG tablet   Commonly known as: NORVASC   Take 10 mg by mouth daily.      CALCIUM 600 + D PO   Take 1 tablet by mouth daily.      cloNIDine 0.3 MG tablet   Commonly known as: CATAPRES   Take 0.3 mg by mouth 2 (two) times daily.      coumadin book Misc   5 each by Does not apply route once.      docusate sodium 100 MG capsule   Commonly known as: COLACE   Take 100 mg by mouth daily.      esomeprazole 40 MG capsule   Commonly known as: NEXIUM  Take 40 mg by mouth daily before breakfast.      fenofibrate 160 MG tablet   Take 160 mg by mouth daily.      ferrous sulfate 325 (65 FE) MG tablet   Take 1 tablet (325 mg total) by mouth 2 (two) times daily with a meal.      hydrochlorothiazide 25 MG tablet   Commonly known as: HYDRODIURIL   Take 25 mg by mouth daily.      losartan 100 MG tablet   Commonly known as: COZAAR   Take 100 mg by mouth daily.      methimazole 10 MG tablet   Commonly known as: TAPAZOLE   Take 5 mg by mouth 3 (three) times a week.      methocarbamol 500 MG tablet   Commonly known as: ROBAXIN   Take 1 tablet (500 mg total) by mouth every 6 (six) hours as needed.      metoprolol 50 MG tablet   Commonly known as: LOPRESSOR   Take 50 mg by mouth daily.      oxyCODONE-acetaminophen 5-325 MG per tablet   Commonly known as: PERCOCET   Take 1-2 tablets by mouth every 4 (four) hours as needed.      pravastatin 80 MG tablet   Commonly known as: PRAVACHOL   Take  80 mg by mouth daily.      PROBIOTIC FORMULA PO   Take 1 tablet by mouth daily.      warfarin 5 MG tablet   Commonly known as: COUMADIN   Take 1 tablet (5 mg total) by mouth daily. Adjust dose per home health            Diagnostic Studies: Dg Chest 2 View  11/10/2011  *RADIOLOGY REPORT*  Clinical Data: Preop for left hip replacement  CHEST - 2 VIEW  Comparison: None.  Findings: Cardiomediastinal silhouette is unremarkable.  Bony thorax is unremarkable.  No acute infiltrate or pleural effusion. No pulmonary edema.  IMPRESSION: No active disease.  Original Report Authenticated By: Natasha Mead, M.D.   Dg Pelvis Portable  11/14/2011  *RADIOLOGY REPORT*  Clinical Data: Postop left total hip.  PORTABLE PELVIS  Comparison: 09/21/2010.  Findings: Changes of left hip replacement.  No hardware or bony complicating feature.  Soft tissue gas noted.  No acute bony abnormality.  IMPRESSION: Left hip replacement.  No complicating feature.  Original Report Authenticated By: Cyndie Chime, M.D.   Dg Hip Portable 1 View Left  11/14/2011  *RADIOLOGY REPORT*  Clinical Data: Postop total left hip.  PORTABLE LEFT HIP - 1 VIEW  Comparison: None.  Findings: Cross-table lateral view demonstrates changes of left hip replacement.  Normal alignment.  Soft tissue gas noted.  IMPRESSION: Left hip replacement.  No complicating feature.  Original Report Authenticated By: Cyndie Chime, M.D.    Disposition: Home  Discharge Orders    Future Orders Please Complete By Expires   Diet general      Call MD / Call 911      Comments:   If you experience chest pain or shortness of breath, CALL 911 and be transported to the hospital emergency room.  If you develope a fever above 101 F, pus (white drainage) or increased drainage or redness at the wound, or calf pain, call your surgeon's office.   Increase activity slowly as tolerated      Weight Bearing as taught in Physical Therapy      Comments:   Use a walker  or crutches  as instructed.   Follow the hip precautions as taught in Physical Therapy         Follow-up Information    Follow up with Skylan Gift L, MD. Schedule an appointment as soon as possible for a visit in 2 weeks.   Contact information:   [address] [place] [address] [phone]           Signed: Harvie Junior 11/17/2011, 8:22 AM

## 2011-11-17 NOTE — Progress Notes (Addendum)
Physical Therapy Treatment Patient Details Name: Jackie Lynch MRN: [id number] DOB: [birth year] Today's Date: 11/17/2011 Time: 1040-1056 PT Time Calculation (min): 16 min  PT Assessment / Plan / Recommendation Comments on Treatment Session  Pt admitted s/p left THA and is ready for safe d/c home once medically cleared by MD.  Very motivated.    Follow Up Recommendations  Home health PT    Barriers to Discharge        Equipment Recommendations  None recommended by PT    Recommendations for Other Services    Frequency 7X/week   Plan Discharge plan remains appropriate;Frequency remains appropriate    Precautions / Restrictions Precautions Precautions: Posterior Hip Precaution Booklet Issued: No Precaution Comments: Pt reminded of 3/3 posterior hip precautions.  Restrictions Weight Bearing Restrictions: Yes LLE Weight Bearing: Weight bearing as tolerated   Pertinent Vitals/Pain N/A    Mobility  Bed Mobility Bed Mobility: Not assessed Transfers Transfers: Sit to Stand;Stand to Sit Sit to Stand: 6: Modified independent (Device/Increase time) Stand to Sit: 6: Modified independent (Device/Increase time) Details for Transfer Assistance: Cues for sequence and safe hand placement.  Ambulation/Gait Ambulation/Gait Assistance: 5: Supervision Ambulation Distance (Feet): 10 Feet (Distance limited by pt fatigue. Goal of session exercises.) Assistive device: Rolling walker Ambulation/Gait Assistance Details: Cues for tall posture. Gait Pattern: Step-through pattern;Trunk flexed;Decreased step length - left;Decreased stance time - left Stairs: No Wheelchair Mobility Wheelchair Mobility: No    Exercises    PT Diagnosis:    PT Problem List:   PT Treatment Interventions:     PT Goals Acute Rehab PT Goals PT Goal Formulation: With patient Time For Goal Achievement: 11/22/11 Potential to Achieve Goals: Good PT Goal: Sit to Stand - Progress: Met PT Goal: Ambulate - Progress:  Progressing toward goal PT Goal: Perform Home Exercise Program - Progress: Progressing toward goal  Visit Information  Last PT Received On: 11/17/11 Assistance Needed: +1    Subjective Data  Subjective: "I am going to go some time around noon." Patient Stated Goal: Go home.    Cognition  Overall Cognitive Status: Appears within functional limits for tasks assessed/performed Arousal/Alertness: Awake/alert Orientation Level: Appears intact for tasks assessed Behavior During Session: [facility] for tasks performed    Balance  Balance Balance Assessed: No  End of Session PT - End of Session Equipment Utilized During Treatment: Gait belt Activity Tolerance: Patient tolerated treatment well Patient left: in chair;with call bell/phone within reach Nurse Communication: Mobility status    Cephus Shelling 11/17/2011, 10:55 AM  11/17/2011 Cephus Shelling, PT, DPT [phone]

## 2011-11-17 NOTE — Progress Notes (Signed)
ANTICOAGULATION CONSULT NOTE - Follow Up Consult  Pharmacy Consult for Coumadin Indication: VTE prophylaxis  No Known Allergies  Patient Measurements: Height: 5' (152.4 cm) (on 11/10/11) Weight: 168 lb 6.4 oz (76.386 kg) (on 11/10/11) IBW/kg (Calculated) : 45.5  Heparin Dosing Weight:   Vital Signs: Temp: 99.3 F (37.4 C) (05/16 0540) BP: 110/57 mmHg (05/16 0540) Pulse Rate: 81  (05/16 0540)  Labs:  Basename 11/17/11 0545 11/16/11 0550 11/15/11 0650  HGB 8.2* 9.0* --  HCT 24.6* 27.0* 27.6*  PLT 147* 176 194  APTT -- -- --  LABPROT 35.5* 25.6* 15.2  INR 3.48* 2.29* 1.18  HEPARINUNFRC -- -- --  CREATININE 1.13* 1.48* 1.26*  CKTOTAL -- -- --  CKMB -- -- --  TROPONINI -- -- --    Estimated Creatinine Clearance: 42.9 ml/min (by C-G formula based on Cr of 1.13).  Assessment: 70yof on Coumadin for VTE prophylaxis s/p THA. INR slightly greater than goal despite no Coumadin yesterday.  Noted plans to d/c home today.   - H/H and Plts trended down - No significant bleeding reported  Goal of Therapy:  INR ~2 (per MD)   Plan:  Spoke with patient and RN - instructed patient not to take any warfarin today and plan is to have home health come out tomorrow to check INR.

## 2011-11-17 NOTE — Progress Notes (Signed)
Occupational Therapy Treatment Patient Details Name: Jackie Lynch MRN: [id number] DOB: August 22, 1940 Today's Date: 11/17/2011 Time: 4098-1191 OT Time Calculation (min): 19 min  OT Assessment / Plan / Recommendation Comments on Treatment Session Reeducated pt'husband of importance of following hip precautions. Focus on training with AE. At end of session, pt indep with AE. Pt/husband very appreciative of information and visit.    Follow Up Recommendations  No OT follow up    Barriers to Discharge  None    Equipment Recommendations  None recommended by OT    Recommendations for Other Services  none  Frequency Min 2X/week   Plan Discharge plan remains appropriate    Precautions / Restrictions Precautions Precautions: Posterior Hip Precaution Booklet Issued: No Precaution Comments: stated 3/3 precautions Restrictions Weight Bearing Restrictions: Yes LLE Weight Bearing: Weight bearing as tolerated   Pertinent Vitals/Pain No significant pain per pt.    ADL  Eating/Feeding: Simulated;Independent Grooming: Performed;Supervision/safety Where Assessed - Grooming: Unsupported standing Upper Body Bathing: Simulated;Set up Lower Body Bathing: Simulated;Moderate assistance Where Assessed - Lower Body Bathing: Supported sit to stand Upper Body Dressing: Simulated;Set up Where Assessed - Upper Body Dressing: Unsupported sitting Lower Body Dressing: Performed;Moderate assistance Where Assessed - Lower Body Dressing: Sopported sit to stand Toilet Transfer: Performed;Supervision/safety;Other (comment) (S from husband) Acupuncturist: Bedside commode;Other (comment) (over toilet) Toileting - Clothing Manipulation and Hygiene: Performed;Supervision/safety Where Assessed - Engineer, mining and Hygiene: Standing Tub/Shower Transfer: Performed;Independent Tub/Shower Transfer Method: Science writer: Other (comment) (3 in 1) Equipment Used:  Gait belt;Reacher;Long-handled sponge;Long-handled shoe horn;Rolling walker;Sock aid Transfers/Ambulation Related to ADLs: S ADL Comments: Focus of session on ADL retraining with AE. Pt/husband indep with use of AE to complete LB ADL while following hip precautions.PT purchased hip kit. Given ADL bags.    OT Diagnosis: Generalized weakness;Acute pain  OT Problem List: Decreased strength;Decreased knowledge of use of DME or AE;Decreased knowledge of precautions;Pain OT Treatment Interventions: Self-care/ADL training;DME and/or AE instruction;Therapeutic activities;Patient/family education   OT Goals Acute Rehab OT Goals OT Goal Formulation: With patient Time For Goal Achievement: 11/17/11 Potential to Achieve Goals: Good ADL Goals Additional ADL Goal #1: PT/husband indiep with use of AE for LB ADL. ADL Goal: Additional Goal #1 - Progress: Met Additional ADL Goal #2: Pt able to deemonstrate hip precautions during ADL session with cuing of husband. ADL Goal: Additional Goal #2 - Progress: Met  Visit Information  Last OT Received On: 11/17/11 Assistance Needed: +1    Subjective Data   I'm going home today.   Prior Functioning  Home Living Lives With: Family Available Help at Discharge: Family Type of Home: House Home Access: Stairs to enter Secretary/administrator of Steps: 2 Entrance Stairs-Rails: Can reach both Home Layout: One level Bathroom Shower/Tub: Walk-in shower;Door [employer] Toilet: Standard Bathroom Accessibility: Yes How Accessible: Accessible via walker Home Adaptive Equipment: Bedside commode/3-in-1;Walker - rolling;Shower chair with back Prior Function Level of Independence: Independent Able to Take Stairs?: Yes Driving: Yes Vocation: Part time employment Comments: ins Communication Communication: No difficulties Dominant Hand: Right    Cognition  Overall Cognitive Status: Appears within functional limits for tasks assessed/performed Arousal/Alertness:  Awake/alert Orientation Level: Appears intact for tasks assessed Behavior During Session: [facility] for tasks performed    Mobility Bed Mobility Bed Mobility: Not assessed Transfers Transfers:  [facility] for ADL) Sit to Stand: 6: Modified independent (Device/Increase time) Stand to Sit: 6: Modified independent (Device/Increase time)   Exercises Total Joint Exercises Ankle Circles/Pumps: AROM;Left;10 reps;Supine Quad Sets: AROM;Left;10  reps;Supine Short Arc Quad: AROM;Left;10 reps;Supine Heel Slides: AROM;Left;10 reps;Supine Hip ABduction/ADduction: Left;10 reps;Supine;AROM;Standing Long Arc Quad: AROM;Left;10 reps;Supine Knee Flexion: AROM;Left;10 reps;Standing Marching in Standing: AROM;Left;10 reps;Standing Standing Hip Extension: AROM;Left;10 reps;Standing  Balance Balance Balance Assessed: No  End of Session OT - End of Session Equipment Utilized During Treatment: Gait belt Activity Tolerance: Patient tolerated treatment well Patient left: in chair;with call bell/phone within reach;with family/visitor present Nurse Communication: Other (comment) (D/C status)   Araiya Tilmon,HILLARY 11/17/2011, 2:06 PM [facility], OTR/L  [phone] 11/17/2011

## 2011-11-18 DIAGNOSIS — M1990 Unspecified osteoarthritis, unspecified site: Secondary | ICD-10-CM | POA: Diagnosis not present

## 2011-11-18 DIAGNOSIS — R269 Unspecified abnormalities of gait and mobility: Secondary | ICD-10-CM | POA: Diagnosis not present

## 2011-11-18 DIAGNOSIS — Z96649 Presence of unspecified artificial hip joint: Secondary | ICD-10-CM | POA: Diagnosis not present

## 2011-11-18 DIAGNOSIS — Z471 Aftercare following joint replacement surgery: Secondary | ICD-10-CM | POA: Diagnosis not present

## 2011-11-18 DIAGNOSIS — E039 Hypothyroidism, unspecified: Secondary | ICD-10-CM | POA: Diagnosis not present

## 2011-11-18 DIAGNOSIS — E785 Hyperlipidemia, unspecified: Secondary | ICD-10-CM | POA: Diagnosis not present

## 2011-11-18 DIAGNOSIS — I10 Essential (primary) hypertension: Secondary | ICD-10-CM | POA: Diagnosis not present

## 2011-11-21 DIAGNOSIS — R269 Unspecified abnormalities of gait and mobility: Secondary | ICD-10-CM | POA: Diagnosis not present

## 2011-11-21 DIAGNOSIS — Z471 Aftercare following joint replacement surgery: Secondary | ICD-10-CM | POA: Diagnosis not present

## 2011-11-21 DIAGNOSIS — I10 Essential (primary) hypertension: Secondary | ICD-10-CM | POA: Diagnosis not present

## 2011-11-21 DIAGNOSIS — M1990 Unspecified osteoarthritis, unspecified site: Secondary | ICD-10-CM | POA: Diagnosis not present

## 2011-11-21 DIAGNOSIS — E039 Hypothyroidism, unspecified: Secondary | ICD-10-CM | POA: Diagnosis not present

## 2011-11-21 DIAGNOSIS — Z96649 Presence of unspecified artificial hip joint: Secondary | ICD-10-CM | POA: Diagnosis not present

## 2011-11-22 DIAGNOSIS — Z471 Aftercare following joint replacement surgery: Secondary | ICD-10-CM | POA: Diagnosis not present

## 2011-11-22 DIAGNOSIS — M1990 Unspecified osteoarthritis, unspecified site: Secondary | ICD-10-CM | POA: Diagnosis not present

## 2011-11-22 DIAGNOSIS — R269 Unspecified abnormalities of gait and mobility: Secondary | ICD-10-CM | POA: Diagnosis not present

## 2011-11-22 DIAGNOSIS — E039 Hypothyroidism, unspecified: Secondary | ICD-10-CM | POA: Diagnosis not present

## 2011-11-22 DIAGNOSIS — I10 Essential (primary) hypertension: Secondary | ICD-10-CM | POA: Diagnosis not present

## 2011-11-22 DIAGNOSIS — Z96649 Presence of unspecified artificial hip joint: Secondary | ICD-10-CM | POA: Diagnosis not present

## 2011-11-23 DIAGNOSIS — Z471 Aftercare following joint replacement surgery: Secondary | ICD-10-CM | POA: Diagnosis not present

## 2011-11-23 DIAGNOSIS — R269 Unspecified abnormalities of gait and mobility: Secondary | ICD-10-CM | POA: Diagnosis not present

## 2011-11-23 DIAGNOSIS — Z96649 Presence of unspecified artificial hip joint: Secondary | ICD-10-CM | POA: Diagnosis not present

## 2011-11-23 DIAGNOSIS — I10 Essential (primary) hypertension: Secondary | ICD-10-CM | POA: Diagnosis not present

## 2011-11-23 DIAGNOSIS — M1990 Unspecified osteoarthritis, unspecified site: Secondary | ICD-10-CM | POA: Diagnosis not present

## 2011-11-23 DIAGNOSIS — E039 Hypothyroidism, unspecified: Secondary | ICD-10-CM | POA: Diagnosis not present

## 2011-11-24 DIAGNOSIS — Z96649 Presence of unspecified artificial hip joint: Secondary | ICD-10-CM | POA: Diagnosis not present

## 2011-11-24 DIAGNOSIS — M1990 Unspecified osteoarthritis, unspecified site: Secondary | ICD-10-CM | POA: Diagnosis not present

## 2011-11-24 DIAGNOSIS — E876 Hypokalemia: Secondary | ICD-10-CM | POA: Diagnosis not present

## 2011-11-24 DIAGNOSIS — R269 Unspecified abnormalities of gait and mobility: Secondary | ICD-10-CM | POA: Diagnosis not present

## 2011-11-24 DIAGNOSIS — E039 Hypothyroidism, unspecified: Secondary | ICD-10-CM | POA: Diagnosis not present

## 2011-11-24 DIAGNOSIS — I10 Essential (primary) hypertension: Secondary | ICD-10-CM | POA: Diagnosis not present

## 2011-11-24 DIAGNOSIS — Z471 Aftercare following joint replacement surgery: Secondary | ICD-10-CM | POA: Diagnosis not present

## 2011-11-25 DIAGNOSIS — I10 Essential (primary) hypertension: Secondary | ICD-10-CM | POA: Diagnosis not present

## 2011-11-25 DIAGNOSIS — Z96649 Presence of unspecified artificial hip joint: Secondary | ICD-10-CM | POA: Diagnosis not present

## 2011-11-25 DIAGNOSIS — R269 Unspecified abnormalities of gait and mobility: Secondary | ICD-10-CM | POA: Diagnosis not present

## 2011-11-25 DIAGNOSIS — E039 Hypothyroidism, unspecified: Secondary | ICD-10-CM | POA: Diagnosis not present

## 2011-11-25 DIAGNOSIS — Z471 Aftercare following joint replacement surgery: Secondary | ICD-10-CM | POA: Diagnosis not present

## 2011-11-25 DIAGNOSIS — M1990 Unspecified osteoarthritis, unspecified site: Secondary | ICD-10-CM | POA: Diagnosis not present

## 2011-11-29 DIAGNOSIS — R269 Unspecified abnormalities of gait and mobility: Secondary | ICD-10-CM | POA: Diagnosis not present

## 2011-11-29 DIAGNOSIS — Z96649 Presence of unspecified artificial hip joint: Secondary | ICD-10-CM | POA: Diagnosis not present

## 2011-11-29 DIAGNOSIS — I10 Essential (primary) hypertension: Secondary | ICD-10-CM | POA: Diagnosis not present

## 2011-11-29 DIAGNOSIS — E039 Hypothyroidism, unspecified: Secondary | ICD-10-CM | POA: Diagnosis not present

## 2011-11-29 DIAGNOSIS — M1990 Unspecified osteoarthritis, unspecified site: Secondary | ICD-10-CM | POA: Diagnosis not present

## 2011-11-29 DIAGNOSIS — Z471 Aftercare following joint replacement surgery: Secondary | ICD-10-CM | POA: Diagnosis not present

## 2011-11-30 DIAGNOSIS — E039 Hypothyroidism, unspecified: Secondary | ICD-10-CM | POA: Diagnosis not present

## 2011-11-30 DIAGNOSIS — M1990 Unspecified osteoarthritis, unspecified site: Secondary | ICD-10-CM | POA: Diagnosis not present

## 2011-11-30 DIAGNOSIS — I10 Essential (primary) hypertension: Secondary | ICD-10-CM | POA: Diagnosis not present

## 2011-11-30 DIAGNOSIS — R269 Unspecified abnormalities of gait and mobility: Secondary | ICD-10-CM | POA: Diagnosis not present

## 2011-11-30 DIAGNOSIS — Z471 Aftercare following joint replacement surgery: Secondary | ICD-10-CM | POA: Diagnosis not present

## 2011-11-30 DIAGNOSIS — Z96649 Presence of unspecified artificial hip joint: Secondary | ICD-10-CM | POA: Diagnosis not present

## 2011-12-02 DIAGNOSIS — Z96649 Presence of unspecified artificial hip joint: Secondary | ICD-10-CM | POA: Diagnosis not present

## 2011-12-02 DIAGNOSIS — R269 Unspecified abnormalities of gait and mobility: Secondary | ICD-10-CM | POA: Diagnosis not present

## 2011-12-02 DIAGNOSIS — E039 Hypothyroidism, unspecified: Secondary | ICD-10-CM | POA: Diagnosis not present

## 2011-12-02 DIAGNOSIS — M1990 Unspecified osteoarthritis, unspecified site: Secondary | ICD-10-CM | POA: Diagnosis not present

## 2011-12-02 DIAGNOSIS — Z471 Aftercare following joint replacement surgery: Secondary | ICD-10-CM | POA: Diagnosis not present

## 2011-12-02 DIAGNOSIS — I10 Essential (primary) hypertension: Secondary | ICD-10-CM | POA: Diagnosis not present

## 2011-12-05 DIAGNOSIS — M1990 Unspecified osteoarthritis, unspecified site: Secondary | ICD-10-CM | POA: Diagnosis not present

## 2011-12-05 DIAGNOSIS — Z471 Aftercare following joint replacement surgery: Secondary | ICD-10-CM | POA: Diagnosis not present

## 2011-12-05 DIAGNOSIS — E039 Hypothyroidism, unspecified: Secondary | ICD-10-CM | POA: Diagnosis not present

## 2011-12-05 DIAGNOSIS — I10 Essential (primary) hypertension: Secondary | ICD-10-CM | POA: Diagnosis not present

## 2011-12-05 DIAGNOSIS — Z96649 Presence of unspecified artificial hip joint: Secondary | ICD-10-CM | POA: Diagnosis not present

## 2011-12-05 DIAGNOSIS — R269 Unspecified abnormalities of gait and mobility: Secondary | ICD-10-CM | POA: Diagnosis not present

## 2011-12-06 DIAGNOSIS — I10 Essential (primary) hypertension: Secondary | ICD-10-CM | POA: Diagnosis not present

## 2011-12-06 DIAGNOSIS — E039 Hypothyroidism, unspecified: Secondary | ICD-10-CM | POA: Diagnosis not present

## 2011-12-06 DIAGNOSIS — Z96649 Presence of unspecified artificial hip joint: Secondary | ICD-10-CM | POA: Diagnosis not present

## 2011-12-06 DIAGNOSIS — R269 Unspecified abnormalities of gait and mobility: Secondary | ICD-10-CM | POA: Diagnosis not present

## 2011-12-06 DIAGNOSIS — M1990 Unspecified osteoarthritis, unspecified site: Secondary | ICD-10-CM | POA: Diagnosis not present

## 2011-12-06 DIAGNOSIS — Z471 Aftercare following joint replacement surgery: Secondary | ICD-10-CM | POA: Diagnosis not present

## 2011-12-08 DIAGNOSIS — Z471 Aftercare following joint replacement surgery: Secondary | ICD-10-CM | POA: Diagnosis not present

## 2011-12-08 DIAGNOSIS — E039 Hypothyroidism, unspecified: Secondary | ICD-10-CM | POA: Diagnosis not present

## 2011-12-08 DIAGNOSIS — R269 Unspecified abnormalities of gait and mobility: Secondary | ICD-10-CM | POA: Diagnosis not present

## 2011-12-08 DIAGNOSIS — Z96649 Presence of unspecified artificial hip joint: Secondary | ICD-10-CM | POA: Diagnosis not present

## 2011-12-08 DIAGNOSIS — M1990 Unspecified osteoarthritis, unspecified site: Secondary | ICD-10-CM | POA: Diagnosis not present

## 2011-12-08 DIAGNOSIS — I10 Essential (primary) hypertension: Secondary | ICD-10-CM | POA: Diagnosis not present

## 2011-12-09 DIAGNOSIS — E039 Hypothyroidism, unspecified: Secondary | ICD-10-CM | POA: Diagnosis not present

## 2011-12-09 DIAGNOSIS — Z471 Aftercare following joint replacement surgery: Secondary | ICD-10-CM | POA: Diagnosis not present

## 2011-12-09 DIAGNOSIS — R269 Unspecified abnormalities of gait and mobility: Secondary | ICD-10-CM | POA: Diagnosis not present

## 2011-12-09 DIAGNOSIS — M1990 Unspecified osteoarthritis, unspecified site: Secondary | ICD-10-CM | POA: Diagnosis not present

## 2011-12-09 DIAGNOSIS — I10 Essential (primary) hypertension: Secondary | ICD-10-CM | POA: Diagnosis not present

## 2011-12-09 DIAGNOSIS — Z96649 Presence of unspecified artificial hip joint: Secondary | ICD-10-CM | POA: Diagnosis not present

## 2011-12-13 DIAGNOSIS — Z96649 Presence of unspecified artificial hip joint: Secondary | ICD-10-CM | POA: Diagnosis not present

## 2011-12-13 DIAGNOSIS — M169 Osteoarthritis of hip, unspecified: Secondary | ICD-10-CM | POA: Diagnosis not present

## 2011-12-15 DIAGNOSIS — M1710 Unilateral primary osteoarthritis, unspecified knee: Secondary | ICD-10-CM | POA: Diagnosis not present

## 2011-12-15 DIAGNOSIS — M169 Osteoarthritis of hip, unspecified: Secondary | ICD-10-CM | POA: Diagnosis not present

## 2011-12-15 DIAGNOSIS — Z96649 Presence of unspecified artificial hip joint: Secondary | ICD-10-CM | POA: Diagnosis not present

## 2011-12-19 DIAGNOSIS — M169 Osteoarthritis of hip, unspecified: Secondary | ICD-10-CM | POA: Diagnosis not present

## 2011-12-19 DIAGNOSIS — Z96649 Presence of unspecified artificial hip joint: Secondary | ICD-10-CM | POA: Diagnosis not present

## 2011-12-22 DIAGNOSIS — M169 Osteoarthritis of hip, unspecified: Secondary | ICD-10-CM | POA: Diagnosis not present

## 2012-01-03 DIAGNOSIS — M169 Osteoarthritis of hip, unspecified: Secondary | ICD-10-CM | POA: Diagnosis not present

## 2012-01-11 DIAGNOSIS — M169 Osteoarthritis of hip, unspecified: Secondary | ICD-10-CM | POA: Diagnosis not present

## 2012-01-11 DIAGNOSIS — M1710 Unilateral primary osteoarthritis, unspecified knee: Secondary | ICD-10-CM | POA: Diagnosis not present

## 2012-01-13 DIAGNOSIS — Z803 Family history of malignant neoplasm of breast: Secondary | ICD-10-CM | POA: Diagnosis not present

## 2012-01-13 DIAGNOSIS — Z1231 Encounter for screening mammogram for malignant neoplasm of breast: Secondary | ICD-10-CM | POA: Diagnosis not present

## 2012-01-16 DIAGNOSIS — R29898 Other symptoms and signs involving the musculoskeletal system: Secondary | ICD-10-CM | POA: Diagnosis not present

## 2012-02-13 DIAGNOSIS — H2510 Age-related nuclear cataract, unspecified eye: Secondary | ICD-10-CM | POA: Diagnosis not present

## 2012-04-02 DIAGNOSIS — Z79899 Other long term (current) drug therapy: Secondary | ICD-10-CM | POA: Diagnosis not present

## 2012-04-02 DIAGNOSIS — E785 Hyperlipidemia, unspecified: Secondary | ICD-10-CM | POA: Diagnosis not present

## 2012-04-02 DIAGNOSIS — M810 Age-related osteoporosis without current pathological fracture: Secondary | ICD-10-CM | POA: Diagnosis not present

## 2012-04-02 DIAGNOSIS — Z23 Encounter for immunization: Secondary | ICD-10-CM | POA: Diagnosis not present

## 2012-04-02 DIAGNOSIS — I10 Essential (primary) hypertension: Secondary | ICD-10-CM | POA: Diagnosis not present

## 2012-04-06 DIAGNOSIS — E785 Hyperlipidemia, unspecified: Secondary | ICD-10-CM | POA: Diagnosis not present

## 2012-04-06 DIAGNOSIS — I10 Essential (primary) hypertension: Secondary | ICD-10-CM | POA: Diagnosis not present

## 2012-04-09 DIAGNOSIS — E0520 Thyrotoxicosis with toxic multinodular goiter without thyrotoxic crisis or storm: Secondary | ICD-10-CM | POA: Diagnosis not present

## 2012-04-17 DIAGNOSIS — M47817 Spondylosis without myelopathy or radiculopathy, lumbosacral region: Secondary | ICD-10-CM | POA: Diagnosis not present

## 2012-04-17 DIAGNOSIS — M549 Dorsalgia, unspecified: Secondary | ICD-10-CM | POA: Diagnosis not present

## 2012-04-30 DIAGNOSIS — L821 Other seborrheic keratosis: Secondary | ICD-10-CM | POA: Diagnosis not present

## 2012-04-30 DIAGNOSIS — L909 Atrophic disorder of skin, unspecified: Secondary | ICD-10-CM | POA: Diagnosis not present

## 2012-04-30 DIAGNOSIS — L819 Disorder of pigmentation, unspecified: Secondary | ICD-10-CM | POA: Diagnosis not present

## 2012-04-30 DIAGNOSIS — L739 Follicular disorder, unspecified: Secondary | ICD-10-CM | POA: Diagnosis not present

## 2012-04-30 DIAGNOSIS — B079 Viral wart, unspecified: Secondary | ICD-10-CM | POA: Diagnosis not present

## 2012-04-30 DIAGNOSIS — D239 Other benign neoplasm of skin, unspecified: Secondary | ICD-10-CM | POA: Diagnosis not present

## 2012-04-30 DIAGNOSIS — L919 Hypertrophic disorder of the skin, unspecified: Secondary | ICD-10-CM | POA: Diagnosis not present

## 2012-04-30 DIAGNOSIS — L84 Corns and callosities: Secondary | ICD-10-CM | POA: Diagnosis not present

## 2012-05-21 DIAGNOSIS — M25569 Pain in unspecified knee: Secondary | ICD-10-CM | POA: Diagnosis not present

## 2012-05-21 DIAGNOSIS — M19019 Primary osteoarthritis, unspecified shoulder: Secondary | ICD-10-CM | POA: Diagnosis not present

## 2012-07-19 DIAGNOSIS — M19019 Primary osteoarthritis, unspecified shoulder: Secondary | ICD-10-CM | POA: Diagnosis not present

## 2012-08-14 ENCOUNTER — Other Ambulatory Visit (HOSPITAL_COMMUNITY): Payer: Medicare Other

## 2012-08-17 ENCOUNTER — Encounter (HOSPITAL_COMMUNITY): Payer: Self-pay | Admitting: Pharmacy Technician

## 2012-08-17 NOTE — Pre-Procedure Instructions (Signed)
Jackie Lynch  08/17/2012   Your procedure is scheduled on:  Friday, February 28th.  Report to [facility] at 5:30 AM.  Call this number if you have problems the morning of surgery: [phone]   Remember:   Do not eat food or drink liquids after midnight.    Take these medicines the morning of surgery with A SIP OF WATER: Amlodipine (Norvasc), Clonidine (Catapres), Omeprazole (Prilosec), Methimazole (Tapazole).   Do not wear jewelry, make-up or nail polish.  Do not wear lotions, powders, or perfumes. You may wear deodorant.             Men may shave face and neck.  Do not bring valuables to the hospital.  Contacts, dentures or bridgework may not be worn into surgery.  Leave suitcase in the car. After surgery it may be brought to your room.  For patients admitted to the hospital, checkout time is 11:00 AM the day of  discharge.    Special Instructions: Shower using CHG 2 nights before surgery and the night before surgery.  If you shower the day of surgery use CHG.  Use special wash - you have one bottle of CHG for all showers.  You should use approximately 1/3 of the bottle for each shower.    Please read over the following fact sheets that you were given: Pain Booklet, Coughing and Deep Breathing, Blood Transfusion Information and Surgical Site Infection Prevention

## 2012-08-20 ENCOUNTER — Ambulatory Visit (HOSPITAL_COMMUNITY)
Admission: RE | Admit: 2012-08-20 | Discharge: 2012-08-20 | Disposition: A | Discharge status: Home / Self Care | Payer: Medicare Other | Source: Ambulatory Visit | Attending: Anesthesiology | Admitting: Anesthesiology

## 2012-08-20 ENCOUNTER — Encounter (HOSPITAL_COMMUNITY)
Admission: RE | Admit: 2012-08-20 | Discharge: 2012-08-20 | Disposition: A | Discharge status: Home / Self Care | Payer: Medicare Other | Source: Ambulatory Visit | Attending: Orthopedic Surgery | Admitting: Orthopedic Surgery

## 2012-08-20 ENCOUNTER — Encounter (HOSPITAL_COMMUNITY): Payer: Self-pay

## 2012-08-20 DIAGNOSIS — I517 Cardiomegaly: Secondary | ICD-10-CM | POA: Diagnosis not present

## 2012-08-20 DIAGNOSIS — Z01812 Encounter for preprocedural laboratory examination: Secondary | ICD-10-CM | POA: Diagnosis not present

## 2012-08-20 DIAGNOSIS — Z01810 Encounter for preprocedural cardiovascular examination: Secondary | ICD-10-CM | POA: Diagnosis not present

## 2012-08-20 DIAGNOSIS — Z01818 Encounter for other preprocedural examination: Secondary | ICD-10-CM | POA: Diagnosis not present

## 2012-08-20 HISTORY — DX: Anemia, unspecified: D64.9

## 2012-08-20 HISTORY — DX: Personal history of other diseases of the musculoskeletal system and connective tissue: Z87.39

## 2012-08-20 LAB — CBC
HCT: 39.3 % (ref 36.0–46.0)
MCH: 29.1 pg (ref 26.0–34.0)
MCHC: 33.1 g/dL (ref 30.0–36.0)
MCV: 87.9 fL (ref 78.0–100.0)
RDW: 13.3 % (ref 11.5–15.5)

## 2012-08-20 LAB — BASIC METABOLIC PANEL
BUN: 17 mg/dL (ref 6–23)
CO2: 29 mEq/L (ref 19–32)
Chloride: 99 mEq/L (ref 96–112)
Creatinine, Ser: 0.89 mg/dL (ref 0.50–1.10)

## 2012-08-20 LAB — SURGICAL PCR SCREEN
MRSA, PCR: NEGATIVE
Staphylococcus aureus: NEGATIVE

## 2012-08-27 ENCOUNTER — Other Ambulatory Visit: Payer: Self-pay | Admitting: Orthopedic Surgery

## 2012-08-30 MED ORDER — CEFAZOLIN SODIUM-DEXTROSE 2-3 GM-% IV SOLR
2.0000 g | INTRAVENOUS | Status: AC
  Administered 2012-08-31: 2 g via INTRAVENOUS
  Filled 2012-08-30: qty 50

## 2012-08-31 ENCOUNTER — Inpatient Hospital Stay (HOSPITAL_COMMUNITY)
Admission: RE | Admit: 2012-08-31 | Discharge: 2012-09-02 | DRG: 470 | Disposition: A | Discharge status: Home Health | Payer: Medicare Other | Source: Ambulatory Visit | Attending: Orthopedic Surgery | Admitting: Orthopedic Surgery

## 2012-08-31 ENCOUNTER — Inpatient Hospital Stay (HOSPITAL_COMMUNITY): Payer: Medicare Other | Admitting: Anesthesiology

## 2012-08-31 ENCOUNTER — Encounter (HOSPITAL_COMMUNITY): Payer: Self-pay | Admitting: Anesthesiology

## 2012-08-31 ENCOUNTER — Encounter (HOSPITAL_COMMUNITY)
Admission: RE | Disposition: A | Discharge status: Home Health | Payer: Self-pay | Source: Ambulatory Visit | Attending: Orthopedic Surgery

## 2012-08-31 ENCOUNTER — Encounter (HOSPITAL_COMMUNITY): Payer: Self-pay | Admitting: Surgery

## 2012-08-31 DIAGNOSIS — E0590 Thyrotoxicosis, unspecified without thyrotoxic crisis or storm: Secondary | ICD-10-CM | POA: Diagnosis present

## 2012-08-31 DIAGNOSIS — E785 Hyperlipidemia, unspecified: Secondary | ICD-10-CM | POA: Diagnosis present

## 2012-08-31 DIAGNOSIS — Q6101 Congenital single renal cyst: Secondary | ICD-10-CM | POA: Diagnosis not present

## 2012-08-31 DIAGNOSIS — I10 Essential (primary) hypertension: Secondary | ICD-10-CM | POA: Diagnosis present

## 2012-08-31 DIAGNOSIS — K5730 Diverticulosis of large intestine without perforation or abscess without bleeding: Secondary | ICD-10-CM | POA: Diagnosis present

## 2012-08-31 DIAGNOSIS — M169 Osteoarthritis of hip, unspecified: Secondary | ICD-10-CM | POA: Diagnosis present

## 2012-08-31 DIAGNOSIS — Z7901 Long term (current) use of anticoagulants: Secondary | ICD-10-CM

## 2012-08-31 DIAGNOSIS — M1711 Unilateral primary osteoarthritis, right knee: Secondary | ICD-10-CM | POA: Diagnosis present

## 2012-08-31 DIAGNOSIS — K219 Gastro-esophageal reflux disease without esophagitis: Secondary | ICD-10-CM | POA: Diagnosis present

## 2012-08-31 DIAGNOSIS — Z79899 Other long term (current) drug therapy: Secondary | ICD-10-CM | POA: Diagnosis not present

## 2012-08-31 DIAGNOSIS — G8918 Other acute postprocedural pain: Secondary | ICD-10-CM | POA: Diagnosis not present

## 2012-08-31 DIAGNOSIS — M1710 Unilateral primary osteoarthritis, unspecified knee: Principal | ICD-10-CM | POA: Diagnosis present

## 2012-08-31 DIAGNOSIS — M1610 Unilateral primary osteoarthritis, unspecified hip: Secondary | ICD-10-CM | POA: Diagnosis present

## 2012-08-31 DIAGNOSIS — IMO0002 Reserved for concepts with insufficient information to code with codable children: Secondary | ICD-10-CM | POA: Diagnosis not present

## 2012-08-31 DIAGNOSIS — Z96649 Presence of unspecified artificial hip joint: Secondary | ICD-10-CM

## 2012-08-31 HISTORY — PX: TOTAL KNEE ARTHROPLASTY: SHX125

## 2012-08-31 LAB — URINALYSIS, ROUTINE W REFLEX MICROSCOPIC
Bilirubin Urine: NEGATIVE
Nitrite: NEGATIVE
Specific Gravity, Urine: 1.01 (ref 1.005–1.030)
Urobilinogen, UA: 0.2 mg/dL (ref 0.0–1.0)

## 2012-08-31 LAB — HEPATIC FUNCTION PANEL
ALT: 16 U/L (ref 0–35)
AST: 24 U/L (ref 0–37)
Alkaline Phosphatase: 42 U/L (ref 39–117)
Bilirubin, Direct: 0.1 mg/dL (ref 0.0–0.3)
Total Bilirubin: 0.5 mg/dL (ref 0.3–1.2)

## 2012-08-31 LAB — CBC WITH DIFFERENTIAL/PLATELET
Lymphocytes Relative: 32 % (ref 12–46)
Lymphs Abs: 2.2 10*3/uL (ref 0.7–4.0)
Neutro Abs: 3.6 10*3/uL (ref 1.7–7.7)
Neutrophils Relative %: 51 % (ref 43–77)
Platelets: 232 10*3/uL (ref 150–400)
RBC: 4.43 MIL/uL (ref 3.87–5.11)
WBC: 7 10*3/uL (ref 4.0–10.5)

## 2012-08-31 LAB — PROTIME-INR
INR: 0.95 (ref 0.00–1.49)
Prothrombin Time: 12.6 seconds (ref 11.6–15.2)

## 2012-08-31 SURGERY — ARTHROPLASTY, KNEE, TOTAL
Anesthesia: General | Site: Knee | Laterality: Right | Wound class: Clean

## 2012-08-31 MED ORDER — PROPOFOL 10 MG/ML IV BOLUS
INTRAVENOUS | Status: DC | PRN
  Administered 2012-08-31: 160 mg via INTRAVENOUS

## 2012-08-31 MED ORDER — ONDANSETRON HCL 4 MG/2ML IJ SOLN
INTRAMUSCULAR | Status: DC | PRN
  Administered 2012-08-31: 4 mg via INTRAVENOUS

## 2012-08-31 MED ORDER — WARFARIN - PHARMACIST DOSING INPATIENT: Freq: Every day | Status: DC

## 2012-08-31 MED ORDER — METHIMAZOLE 10 MG PO TABS
10.0000 mg | ORAL_TABLET | ORAL | Status: DC
Start: 2012-08-31 — End: 2012-09-02
  Filled 2012-08-31 (×2): qty 1

## 2012-08-31 MED ORDER — ZOLPIDEM TARTRATE 5 MG PO TABS: 5.0000 mg | ORAL_TABLET | Freq: Every evening | ORAL | Status: DC | PRN

## 2012-08-31 MED ORDER — OXYCODONE-ACETAMINOPHEN 5-325 MG PO TABS: 1.0000 | ORAL_TABLET | Freq: Four times a day (QID) | ORAL | Status: DC | PRN

## 2012-08-31 MED ORDER — WARFARIN SODIUM 2.5 MG PO TABS: ORAL_TABLET | ORAL | Status: DC

## 2012-08-31 MED ORDER — HYDROCHLOROTHIAZIDE 25 MG PO TABS
25.0000 mg | ORAL_TABLET | Freq: Every day | ORAL | Status: DC
  Administered 2012-09-01 – 2012-09-02 (×2): 25 mg via ORAL
  Filled 2012-08-31 (×3): qty 1

## 2012-08-31 MED ORDER — ARTIFICIAL TEARS OP OINT
TOPICAL_OINTMENT | OPHTHALMIC | Status: DC | PRN
  Administered 2012-08-31: 1 via OPHTHALMIC

## 2012-08-31 MED ORDER — SODIUM CHLORIDE 0.9 % IR SOLN: Status: DC | PRN

## 2012-08-31 MED ORDER — LACTATED RINGERS IV SOLN
INTRAVENOUS | Status: DC | PRN
  Administered 2012-08-31 (×2): via INTRAVENOUS

## 2012-08-31 MED ORDER — POVIDONE-IODINE 7.5 % EX SOLN
Freq: Once | CUTANEOUS | Status: DC
  Filled 2012-08-31: qty 118

## 2012-08-31 MED ORDER — OXYCODONE HCL 5 MG PO TABS
ORAL_TABLET | ORAL | Status: AC
  Filled 2012-08-31: qty 2

## 2012-08-31 MED ORDER — AMLODIPINE BESYLATE 10 MG PO TABS
10.0000 mg | ORAL_TABLET | Freq: Every day | ORAL | Status: DC
Start: 2012-09-01 — End: 2012-09-02
  Administered 2012-09-01 – 2012-09-02 (×2): 10 mg via ORAL
  Filled 2012-08-31 (×2): qty 1

## 2012-08-31 MED ORDER — PRAVASTATIN SODIUM 40 MG PO TABS
80.0000 mg | ORAL_TABLET | Freq: Every day | ORAL | Status: DC
  Administered 2012-08-31 – 2012-09-01 (×2): 80 mg via ORAL
  Filled 2012-08-31 (×3): qty 2

## 2012-08-31 MED ORDER — ACETAMINOPHEN 10 MG/ML IV SOLN
INTRAVENOUS | Status: DC | PRN
  Administered 2012-08-31: 1000 mg via INTRAVENOUS

## 2012-08-31 MED ORDER — ONDANSETRON HCL 4 MG/2ML IJ SOLN
4.0000 mg | Freq: Four times a day (QID) | INTRAMUSCULAR | Status: DC | PRN
  Administered 2012-08-31 – 2012-09-01 (×3): 4 mg via INTRAVENOUS
  Filled 2012-08-31 (×3): qty 2

## 2012-08-31 MED ORDER — STERILE WATER FOR IRRIGATION IR SOLN
Status: DC | PRN
  Administered 2012-08-31: 1000 mL

## 2012-08-31 MED ORDER — METOCLOPRAMIDE HCL 5 MG/ML IJ SOLN: 5.0000 mg | Freq: Three times a day (TID) | INTRAMUSCULAR | Status: DC | PRN

## 2012-08-31 MED ORDER — OXYCODONE HCL 5 MG PO TABS: 5.0000 mg | ORAL_TABLET | Freq: Once | ORAL | Status: DC | PRN

## 2012-08-31 MED ORDER — METHOCARBAMOL 500 MG PO TABS
ORAL_TABLET | ORAL | Status: AC
  Filled 2012-08-31: qty 1

## 2012-08-31 MED ORDER — METOPROLOL TARTRATE 50 MG PO TABS
50.0000 mg | ORAL_TABLET | Freq: Two times a day (BID) | ORAL | Status: DC
  Administered 2012-08-31 – 2012-09-02 (×4): 50 mg via ORAL
  Filled 2012-08-31 (×6): qty 1

## 2012-08-31 MED ORDER — ALUM & MAG HYDROXIDE-SIMETH 200-200-20 MG/5ML PO SUSP: 30.0000 mL | ORAL | Status: DC | PRN

## 2012-08-31 MED ORDER — ONDANSETRON HCL 4 MG PO TABS
4.0000 mg | ORAL_TABLET | Freq: Four times a day (QID) | ORAL | Status: DC | PRN
  Administered 2012-09-02: 4 mg via ORAL
  Filled 2012-08-31: qty 1

## 2012-08-31 MED ORDER — CLONIDINE HCL 0.3 MG PO TABS: 0.3000 mg | ORAL_TABLET | Freq: Two times a day (BID) | ORAL | Status: DC

## 2012-08-31 MED ORDER — HYDROMORPHONE HCL PF 1 MG/ML IJ SOLN
1.0000 mg | INTRAMUSCULAR | Status: DC | PRN
  Administered 2012-08-31: 1 mg via INTRAVENOUS
  Filled 2012-08-31: qty 1

## 2012-08-31 MED ORDER — ACETAMINOPHEN 10 MG/ML IV SOLN
INTRAVENOUS | Status: AC
  Filled 2012-08-31: qty 100

## 2012-08-31 MED ORDER — LIDOCAINE HCL (CARDIAC) 20 MG/ML IV SOLN
INTRAVENOUS | Status: DC | PRN
  Administered 2012-08-31: 80 mg via INTRAVENOUS

## 2012-08-31 MED ORDER — DEXAMETHASONE SODIUM PHOSPHATE 4 MG/ML IJ SOLN
INTRAMUSCULAR | Status: DC | PRN
  Administered 2012-08-31: 8 mg via INTRAVENOUS

## 2012-08-31 MED ORDER — POLYETHYLENE GLYCOL 3350 17 G PO PACK: 17.0000 g | PACK | Freq: Every day | ORAL | Status: DC | PRN

## 2012-08-31 MED ORDER — SIMVASTATIN 40 MG PO TABS: 40.0000 mg | ORAL_TABLET | Freq: Every day | ORAL | Status: DC

## 2012-08-31 MED ORDER — METHOCARBAMOL 500 MG PO TABS
500.0000 mg | ORAL_TABLET | Freq: Four times a day (QID) | ORAL | Status: DC | PRN
  Administered 2012-08-31 – 2012-09-02 (×4): 500 mg via ORAL
  Filled 2012-08-31 (×3): qty 1

## 2012-08-31 MED ORDER — OXYCODONE HCL 5 MG/5ML PO SOLN: 5.0000 mg | Freq: Once | ORAL | Status: DC | PRN

## 2012-08-31 MED ORDER — BUPIVACAINE-EPINEPHRINE PF 0.5-1:200000 % IJ SOLN
INTRAMUSCULAR | Status: DC | PRN
  Administered 2012-08-31: 30 mL

## 2012-08-31 MED ORDER — SODIUM CHLORIDE 0.9 % IV SOLN
INTRAVENOUS | Status: DC
  Administered 2012-08-31: 75 mL via INTRAVENOUS
  Administered 2012-09-01: 04:00:00 via INTRAVENOUS

## 2012-08-31 MED ORDER — DEXTROSE 5 % IV SOLN
INTRAVENOUS | Status: DC | PRN
  Administered 2012-08-31: 07:00:00 via INTRAVENOUS

## 2012-08-31 MED ORDER — LOSARTAN POTASSIUM 50 MG PO TABS: 100.0000 mg | ORAL_TABLET | Freq: Every day | ORAL | Status: DC

## 2012-08-31 MED ORDER — METOCLOPRAMIDE HCL 10 MG PO TABS: 5.0000 mg | ORAL_TABLET | Freq: Three times a day (TID) | ORAL | Status: DC | PRN

## 2012-08-31 MED ORDER — WARFARIN SODIUM 4 MG PO TABS
4.0000 mg | ORAL_TABLET | Freq: Once | ORAL | Status: AC
  Administered 2012-08-31: 4 mg via ORAL
  Filled 2012-08-31: qty 1

## 2012-08-31 MED ORDER — HYDROMORPHONE HCL PF 1 MG/ML IJ SOLN
INTRAMUSCULAR | Status: AC
Start: 2012-08-31 — End: 2012-08-31
  Filled 2012-08-31: qty 1

## 2012-08-31 MED ORDER — CLONIDINE HCL 0.1 MG PO TABS
0.1000 mg | ORAL_TABLET | Freq: Every day | ORAL | Status: DC
  Administered 2012-09-01 – 2012-09-02 (×2): 0.1 mg via ORAL
  Filled 2012-08-31 (×2): qty 1

## 2012-08-31 MED ORDER — FERROUS SULFATE 325 (65 FE) MG PO TABS
325.0000 mg | ORAL_TABLET | Freq: Two times a day (BID) | ORAL | Status: DC
  Administered 2012-08-31 – 2012-09-02 (×4): 325 mg via ORAL
  Filled 2012-08-31 (×6): qty 1

## 2012-08-31 MED ORDER — FENTANYL CITRATE 0.05 MG/ML IJ SOLN
INTRAMUSCULAR | Status: DC | PRN
  Administered 2012-08-31 (×7): 50 ug via INTRAVENOUS

## 2012-08-31 MED ORDER — DIPHENHYDRAMINE HCL 12.5 MG/5ML PO ELIX: 12.5000 mg | ORAL_SOLUTION | ORAL | Status: DC | PRN

## 2012-08-31 MED ORDER — LOSARTAN POTASSIUM 50 MG PO TABS
100.0000 mg | ORAL_TABLET | Freq: Every day | ORAL | Status: DC
  Administered 2012-09-01 – 2012-09-02 (×2): 100 mg via ORAL
  Filled 2012-08-31 (×3): qty 2

## 2012-08-31 MED ORDER — ACETAMINOPHEN 10 MG/ML IV SOLN
1000.0000 mg | Freq: Four times a day (QID) | INTRAVENOUS | Status: AC
  Administered 2012-09-01 (×3): 1000 mg via INTRAVENOUS
  Filled 2012-08-31 (×4): qty 100

## 2012-08-31 MED ORDER — FENOFIBRATE 160 MG PO TABS
160.0000 mg | ORAL_TABLET | Freq: Every day | ORAL | Status: DC
  Administered 2012-09-01 – 2012-09-02 (×2): 160 mg via ORAL
  Filled 2012-08-31 (×3): qty 1

## 2012-08-31 MED ORDER — METHOCARBAMOL 100 MG/ML IJ SOLN
500.0000 mg | Freq: Four times a day (QID) | INTRAVENOUS | Status: DC | PRN
  Filled 2012-08-31: qty 5

## 2012-08-31 MED ORDER — OXYCODONE HCL 5 MG PO TABS
5.0000 mg | ORAL_TABLET | ORAL | Status: DC | PRN
  Administered 2012-08-31 – 2012-09-02 (×8): 10 mg via ORAL
  Filled 2012-08-31 (×8): qty 2

## 2012-08-31 MED ORDER — CEFAZOLIN SODIUM-DEXTROSE 2-3 GM-% IV SOLR
2.0000 g | Freq: Four times a day (QID) | INTRAVENOUS | Status: AC
  Administered 2012-08-31 (×2): 2 g via INTRAVENOUS
  Filled 2012-08-31 (×2): qty 50

## 2012-08-31 MED ORDER — PANTOPRAZOLE SODIUM 40 MG PO TBEC
80.0000 mg | DELAYED_RELEASE_TABLET | Freq: Every day | ORAL | Status: DC
  Administered 2012-09-01 – 2012-09-02 (×2): 80 mg via ORAL
  Filled 2012-08-31 (×2): qty 2

## 2012-08-31 MED ORDER — DEXTROSE-NACL 5-0.45 % IV SOLN: INTRAVENOUS | Status: DC

## 2012-08-31 MED ORDER — MIDAZOLAM HCL 5 MG/5ML IJ SOLN
INTRAMUSCULAR | Status: DC | PRN
  Administered 2012-08-31 (×2): 1 mg via INTRAVENOUS

## 2012-08-31 MED ORDER — DEXAMETHASONE SODIUM PHOSPHATE 10 MG/ML IJ SOLN
INTRAMUSCULAR | Status: DC | PRN
  Administered 2012-08-31: 10 mg

## 2012-08-31 MED ORDER — HYDROCORTISONE ACETATE 25 MG RE SUPP
25.0000 mg | Freq: Every day | RECTAL | Status: DC | PRN
  Filled 2012-08-31: qty 1

## 2012-08-31 MED ORDER — ONDANSETRON HCL 4 MG/2ML IJ SOLN: 4.0000 mg | Freq: Once | INTRAMUSCULAR | Status: DC | PRN

## 2012-08-31 MED ORDER — DOCUSATE SODIUM 100 MG PO CAPS
100.0000 mg | ORAL_CAPSULE | Freq: Two times a day (BID) | ORAL | Status: DC
  Administered 2012-08-31 – 2012-09-02 (×4): 100 mg via ORAL
  Filled 2012-08-31 (×5): qty 1

## 2012-08-31 MED ORDER — HYDROMORPHONE HCL PF 1 MG/ML IJ SOLN: 0.2500 mg | INTRAMUSCULAR | Status: DC | PRN

## 2012-08-31 SURGICAL SUPPLY — 63 items
APL SKNCLS STERI-STRIP NONHPOA (GAUZE/BANDAGES/DRESSINGS) ×1
BANDAGE ESMARK 6X9 LF (GAUZE/BANDAGES/DRESSINGS) ×1 IMPLANT
BENZOIN TINCTURE PRP APPL 2/3 (GAUZE/BANDAGES/DRESSINGS) ×2 IMPLANT
BLADE SAGITTAL 25.0X1.19X90 (BLADE) ×2 IMPLANT
BLADE SAW SAG 90X13X1.27 (BLADE) ×2 IMPLANT
BNDG CMPR 9X6 STRL LF SNTH (GAUZE/BANDAGES/DRESSINGS) ×1
BNDG ESMARK 6X9 LF (GAUZE/BANDAGES/DRESSINGS) ×2
BOWL SMART MIX CTS (DISPOSABLE) ×2 IMPLANT
CEMENT HV SMART SET (Cement) ×4 IMPLANT
CLOTH BEACON ORANGE TIMEOUT ST (SAFETY) ×2 IMPLANT
CLSR STERI-STRIP ANTIMIC 1/2X4 (GAUZE/BANDAGES/DRESSINGS) ×2 IMPLANT
COVER SURGICAL LIGHT HANDLE (MISCELLANEOUS) ×2 IMPLANT
CUFF TOURNIQUET SINGLE 34IN LL (TOURNIQUET CUFF) ×2 IMPLANT
CUFF TOURNIQUET SINGLE 44IN (TOURNIQUET CUFF) IMPLANT
DRAPE EXTREMITY T 121X128X90 (DRAPE) ×2 IMPLANT
DRAPE U-SHAPE 47X51 STRL (DRAPES) ×2 IMPLANT
DRSG PAD ABDOMINAL 8X10 ST (GAUZE/BANDAGES/DRESSINGS) ×2 IMPLANT
DURAPREP 26ML APPLICATOR (WOUND CARE) ×2 IMPLANT
ELECT REM PT RETURN 9FT ADLT (ELECTROSURGICAL) ×2
ELECTRODE REM PT RTRN 9FT ADLT (ELECTROSURGICAL) ×1 IMPLANT
EVACUATOR 1/8 PVC DRAIN (DRAIN) ×2 IMPLANT
FACESHIELD LNG OPTICON STERILE (SAFETY) ×2 IMPLANT
GAUZE XEROFORM 5X9 LF (GAUZE/BANDAGES/DRESSINGS) ×2 IMPLANT
GLOVE BIOGEL PI IND STRL 8 (GLOVE) ×2 IMPLANT
GLOVE BIOGEL PI INDICATOR 8 (GLOVE) ×2
GLOVE ECLIPSE 6.5 STRL STRAW (GLOVE) ×1 IMPLANT
GLOVE ECLIPSE 7.5 STRL STRAW (GLOVE) ×5 IMPLANT
GOWN PREVENTION PLUS LG XLONG (DISPOSABLE) IMPLANT
GOWN STRL NON-REIN LRG LVL3 (GOWN DISPOSABLE) ×2 IMPLANT
GOWN STRL REIN XL XLG (GOWN DISPOSABLE) ×4 IMPLANT
HANDPIECE INTERPULSE COAX TIP (DISPOSABLE) ×2
HOOD PEEL AWAY FACE SHEILD DIS (HOOD) ×6 IMPLANT
IMMOBILIZER KNEE 20 (SOFTGOODS)
IMMOBILIZER KNEE 20 THIGH 36 (SOFTGOODS) IMPLANT
IMMOBILIZER KNEE 22 UNIV (SOFTGOODS) ×2 IMPLANT
IMMOBILIZER KNEE 24 THIGH 36 (MISCELLANEOUS) IMPLANT
IMMOBILIZER KNEE 24 UNIV (MISCELLANEOUS)
KIT BASIN OR (CUSTOM PROCEDURE TRAY) ×2 IMPLANT
KIT ROOM TURNOVER OR (KITS) ×2 IMPLANT
MANIFOLD NEPTUNE II (INSTRUMENTS) ×2 IMPLANT
NDL HYPO 25GX1X1/2 BEV (NEEDLE) IMPLANT
NEEDLE HYPO 25GX1X1/2 BEV (NEEDLE) IMPLANT
NS IRRIG 1000ML POUR BTL (IV SOLUTION) ×2 IMPLANT
PACK TOTAL JOINT (CUSTOM PROCEDURE TRAY) ×2 IMPLANT
PAD ARMBOARD 7.5X6 YLW CONV (MISCELLANEOUS) ×4 IMPLANT
PAD CAST 4YDX4 CTTN HI CHSV (CAST SUPPLIES) ×1 IMPLANT
PADDING CAST COTTON 4X4 STRL (CAST SUPPLIES) ×2
SET HNDPC FAN SPRY TIP SCT (DISPOSABLE) ×1 IMPLANT
SPONGE GAUZE 4X4 12PLY (GAUZE/BANDAGES/DRESSINGS) ×2 IMPLANT
STAPLER VISISTAT 35W (STAPLE) IMPLANT
STRIP CLOSURE SKIN 1/2X4 (GAUZE/BANDAGES/DRESSINGS) ×2 IMPLANT
SUCTION FRAZIER TIP 10 FR DISP (SUCTIONS) ×2 IMPLANT
SUT MON AB 3-0 SH 27 (SUTURE) ×2
SUT MON AB 3-0 SH27 (SUTURE) IMPLANT
SUT VIC AB 0 CTB1 27 (SUTURE) ×4 IMPLANT
SUT VIC AB 1 CT1 27 (SUTURE) ×4
SUT VIC AB 1 CT1 27XBRD ANBCTR (SUTURE) ×2 IMPLANT
SUT VIC AB 2-0 CTB1 (SUTURE) ×4 IMPLANT
SYR CONTROL 10ML LL (SYRINGE) IMPLANT
TOWEL OR 17X24 6PK STRL BLUE (TOWEL DISPOSABLE) ×2 IMPLANT
TOWEL OR 17X26 10 PK STRL BLUE (TOWEL DISPOSABLE) ×2 IMPLANT
TRAY FOLEY CATH 14FR (SET/KITS/TRAYS/PACK) ×2 IMPLANT
WATER STERILE IRR 1000ML POUR (IV SOLUTION) ×4 IMPLANT

## 2012-08-31 NOTE — Anesthesia Procedure Notes (Addendum)
Anesthesia Regional Block:  Femoral nerve block  Pre-Anesthetic Checklist: ,, timeout performed, Correct Patient, Correct Site, Correct Laterality, Correct Procedure, Correct Position, site marked, Risks and benefits discussed,  Surgical consent,  Pre-op evaluation,  At surgeon's request and post-op pain management  Laterality: Right and Lower  Prep: chloraprep       Needles:  Injection technique: Single-shot  Needle Type: Echogenic Needle     Needle Length: 9cm  Needle Gauge: 21    Additional Needles:  Procedures: ultrasound guided (picture in chart) Femoral nerve block Narrative:  Start time: 08/31/2012 7:02 AM End time: 08/31/2012 7:10 AM Injection made incrementally with aspirations every 5 mL.  Performed by: Personally  Anesthesiologist: Sheldon Silvan, MD  Additional Notes: Maraine 0.5% with EPI 1:200000  Femoral nerve block Procedure Name: LMA Insertion Date/Time: 08/31/2012 7:30 AM Performed by: Wray Kearns A Pre-anesthesia Checklist: Patient identified, Emergency Drugs available, Suction available, Patient being monitored and Timeout performed Patient Re-evaluated:Patient Re-evaluated prior to inductionOxygen Delivery Method: Circle system utilized Preoxygenation: Pre-oxygenation with 100% oxygen Intubation Type: IV induction Ventilation: Mask ventilation without difficulty LMA: LMA inserted LMA Size: 4.0 Tube type: Oral Tube size: 4.0 mm Number of attempts: 1 Tube secured with: Tape Dental Injury: Teeth and Oropharynx as per pre-operative assessment

## 2012-08-31 NOTE — Transfer of Care (Signed)
Immediate Anesthesia Transfer of Care Note  Patient: Jackie Lynch  Procedure(s) Performed: Procedure(s): TOTAL KNEE ARTHROPLASTY (Right)  Patient Location: PACU  Anesthesia Type:General  Level of Consciousness: sedated, patient cooperative and responds to stimulation  Airway & Oxygen Therapy: Patient Spontanous Breathing and Patient connected to face mask oxygen  Post-op Assessment: Report given to PACU RN, Post -op Vital signs reviewed and stable, Patient moving all extremities and Patient moving all extremities X 4  Post vital signs: Reviewed and stable  Complications: No apparent anesthesia complications

## 2012-08-31 NOTE — Progress Notes (Signed)
UR COMPLETED  

## 2012-08-31 NOTE — Progress Notes (Signed)
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 Bee Venom Swelling   Vital Signs: Temp: 98.6 F (37 C) (02/28 0915) Temp src: Oral (02/28 0545) BP: 135/69 mmHg (02/28 1011) Pulse Rate: 80 (02/28 1018)  Labs:  Recent Labs  08/31/12 0559  HGB 12.9  HCT 39.0  PLT 232  APTT 27  LABPROT 12.6  INR 0.95    The CrCl is unknown because both a height and weight (above a minimum accepted value) are required for this calculation.   Medical History: Past Medical History  Diagnosis Date  . PONV (postoperative nausea and vomiting)   . Hyperthyroidism   . Hypertension   . GERD (gastroesophageal reflux disease)   . Arthritis   . 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 Constipation   . Diverticulosis of colon   . Renal cyst, congenital, right   . Nocturia   . Hyperlipidemia   . Anemia   . 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   Noted patient had a L THA in May 2013 and INR was SUPRAtherapeutic with 2 doses of coumadin 5mg  during her hospital stay. Patient does not recall coumadin dose upon her d/c after her THA. No post op CBC drawn yet but was WNL prior to surgery.    Goal of Therapy:  Goal INR 1.5-2 Monitor platelets by anticoagulation protocol: Yes   Plan:  Coumadin 4mg  po x 1 dose today at 1800 Daily PT/INR  Thank you,  Brett Fairy, PharmD, BCPS 08/31/2012 11:13 AM

## 2012-08-31 NOTE — Anesthesia Postprocedure Evaluation (Signed)
  Anesthesia Post-op Note  Patient: Jackie Lynch  Procedure(s) Performed: Procedure(s): TOTAL KNEE ARTHROPLASTY (Right)  Patient Location: PACU  Anesthesia Type:GA combined with regional for post-op pain  Level of Consciousness: awake, alert  and oriented  Airway and Oxygen Therapy: Patient Spontanous Breathing and Patient connected to nasal cannula oxygen  Post-op Pain: mild  Post-op Assessment: Post-op Vital signs reviewed  Post-op Vital Signs: Reviewed  Complications: No apparent anesthesia complications

## 2012-08-31 NOTE — H&P (Signed)
TOTAL KNEE ADMISSION H&P  Patient is being admitted for right total knee arthroplasty.  Subjective:  Chief Complaint:right knee pain.  HPI: Jackie Lynch, 72 y.o. female, has a history of pain and functional disability in the right knee due to arthritis and has failed non-surgical conservative treatments for greater than 12 weeks to includeNSAID's and/or analgesics, corticosteriod injections, viscosupplementation injections, use of assistive devices and activity modification.  Onset of symptoms was gradual, starting 5 years ago with gradually worsening course since that time. The patient noted no past surgery on the right knee(s).  Patient currently rates pain in the right knee(s) at 7 out of 10 with activity. Patient has night pain, worsening of pain with activity and weight bearing, pain that interferes with activities of daily living, crepitus and joint swelling.  Patient has evidence of subchondral sclerosis and joint space narrowing by imaging studies. This patient has had failure of conservative care. There is no active infection.  Patient Active Problem List   Diagnosis Date Noted  . Hypokalemia 11/17/2011  . Renal insufficiency 11/17/2011  . Osteoarthritis of left hip 11/14/2011   Past Medical History  Diagnosis Date  . PONV (postoperative nausea and vomiting)   . Hyperthyroidism   . Hypertension   . GERD (gastroesophageal reflux disease)   . Arthritis   . 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 Constipation   . Diverticulosis of colon   . Renal cyst, congenital, right   . Nocturia   . Hyperlipidemia   . Anemia   . H/O osteopenia     Past Surgical History  Procedure Laterality Date  . Cholecystectomy    . Tubal ligation    . Knee arthroscopy      right and left  . Abdominal hysterectomy    . Total hip arthroplasty  11/14/2011    Procedure: TOTAL HIP ARTHROPLASTY;  Surgeon: Harvie Junior, MD;  Location: [facility] OR;  Service: Orthopedics;  Laterality: Left;   . Joint replacement      Prescriptions prior to admission  Medication Sig Dispense Refill  . acetaminophen (TYLENOL) 500 MG tablet Take 500-1,000 mg by mouth every 6 (six) hours as needed for pain.      Marland Kitchen amLODipine (NORVASC) 10 MG tablet Take 10 mg by mouth daily.      Marland Kitchen CALCIUM-VITAMIN D PO Take 1 tablet by mouth daily.      . cloNIDine (CATAPRES) 0.1 MG tablet Take 0.1 mg by mouth daily.      Marland Kitchen docusate sodium (COLACE) 100 MG capsule Take 100 mg by mouth daily.      . fenofibrate 160 MG tablet Take 160 mg by mouth daily.      . hydrochlorothiazide (HYDRODIURIL) 25 MG tablet Take 25 mg by mouth daily.      Marland Kitchen losartan (COZAAR) 100 MG tablet Take 100 mg by mouth daily.      . metoprolol (LOPRESSOR) 50 MG tablet Take 50 mg by mouth 2 (two) times daily.       Marland Kitchen omeprazole (PRILOSEC) 40 MG capsule Take 40 mg by mouth 2 (two) times daily.      . pravastatin (PRAVACHOL) 80 MG tablet Take 80 mg by mouth daily.      . Probiotic Product (PROBIOTIC FORMULA PO) Take 1 tablet by mouth daily.      . cloNIDine (CATAPRES) 0.3 MG tablet Take 0.3 mg by mouth 2 (two) times daily.      . hydrocortisone (ANUSOL-HC) 25 MG suppository Place  25 mg rectally daily as needed for hemorrhoids.      . methimazole (TAPAZOLE) 10 MG tablet Take 10 mg by mouth 3 (three) times a week.        Allergies  Allergen Reactions  . Bee Venom Swelling    History  Substance Use Topics  . Smoking status: Never Smoker   . Smokeless tobacco: Never Used  . Alcohol Use: No    Family History  Problem Relation Age of Onset  . Anesthesia problems Neg Hx      ROS  Objective:  Physical Exam  Vital signs in last 24 hours: Temp:  [97.7 F (36.5 C)] 97.7 F (36.5 C) (02/28 0545) Pulse Rate:  [67] 67 (02/28 0545) Resp:  [18] 18 (02/28 0545) BP: (131)/(80) 131/80 mmHg (02/28 0545) SpO2:  [100 %] 100 % (02/28 0545)  Labs:   Estimated body mass index is 32.89 kg/(m^2) as calculated from the following:   Height as of  08/20/12: 5' (1.524 m).   Weight as of 11/10/11: 76.386 kg (168 lb 6.4 oz).   Imaging Review Plain radiographs demonstrate severe degenerative joint disease of the right knee(s). The overall alignment ismild varus. The bone quality appears to be fair for age and reported activity level.  Assessment/Plan:  End stage arthritis, right knee   The patient history, physical examination, clinical judgment of the provider and imaging studies are consistent with end stage degenerative joint disease of the right knee(s) and total knee arthroplasty is deemed medically necessary. The treatment options including medical management, injection therapy arthroscopy and arthroplasty were discussed at length. The risks and benefits of total knee arthroplasty were presented and reviewed. The risks due to aseptic loosening, infection, stiffness, patella tracking problems, thromboembolic complications and other imponderables were discussed. The patient acknowledged the explanation, agreed to proceed with the plan and consent was signed. Patient is being admitted for inpatient treatment for surgery, pain control, PT, OT, prophylactic antibiotics, VTE prophylaxis, progressive ambulation and ADL's and discharge planning. The patient is planning to be discharged home with home health services

## 2012-08-31 NOTE — Preoperative (Signed)
Beta Blockers   Reason not to administer Beta Blockers:Pt took Metoprolol @0730hr  on 08/30/2012

## 2012-08-31 NOTE — Evaluation (Signed)
Physical Therapy Evaluation Patient Details Name: Jackie Lynch MRN: [id number] DOB: 27-Jun-1941 Today's Date: 08/31/2012 Time: 4332-9518 PT Time Calculation (min): 41 min  PT Assessment / Plan / Recommendation Clinical Impression  Pt is a 72 y/o female s/p RTKA.  Pt doing well with mobilty.  Acute PT to follow pt in preparation for d/c to home with HHPT.      PT Assessment  Patient needs continued PT services    Follow Up Recommendations  Home health PT    Does the patient have the potential to tolerate intense rehabilitation      Barriers to Discharge None      Equipment Recommendations  None recommended by PT    Recommendations for Other Services     Frequency 7X/week    Precautions / Restrictions Precautions Precautions: Knee Precaution Comments: educated pt on knee precautions Restrictions Weight Bearing Restrictions: Yes RLE Weight Bearing: Weight bearing as tolerated   Pertinent Vitals/Pain 4/10 pain in knee. Pt medicated by RN during session.       Mobility  Bed Mobility Bed Mobility: Supine to Sit Supine to Sit: 4: Min assist;HOB flat;With rails Details for Bed Mobility Assistance: Assist for RLE, VCs for technique. Transfers Transfers: Sit to Stand;Stand to Sit Sit to Stand: 4: Min assist;From bed;With upper extremity assist Stand to Sit: 4: Min assist;To chair/3-in-1;With upper extremity assist Details for Transfer Assistance: VCs for hand placement assist to steady pt and control descent to chair.  Ambulation/Gait Ambulation/Gait Assistance: 4: Min assist Ambulation Distance (Feet): 15 Feet Assistive device: Rolling walker Ambulation/Gait Assistance Details: VCs for gait sequencing,increased gait speed and increased step length.   Gait Pattern: Step-to pattern;Decreased stride length;Trunk flexed Stairs: No Wheelchair Mobility Wheelchair Mobility: No    Exercises Total Joint Exercises Ankle Circles/Pumps: 10 reps;Both   PT Diagnosis:  Difficulty walking;Generalized weakness;Acute pain  PT Problem List: Decreased strength;Decreased range of motion;Decreased mobility;Pain PT Treatment Interventions: Gait training;Stair training;DME instruction;Therapeutic activities;Therapeutic exercise;Patient/family education;Functional mobility training   PT Goals Acute Rehab PT Goals PT Goal Formulation: With patient/family Time For Goal Achievement: 09/07/12 Potential to Achieve Goals: Good Pt will go Supine/Side to Sit: with modified independence PT Goal: Supine/Side to Sit - Progress: Goal set today Pt will go Sit to Supine/Side: with modified independence PT Goal: Sit to Supine/Side - Progress: Goal set today Pt will Transfer Bed to Chair/Chair to Bed: with supervision PT Transfer Goal: Bed to Chair/Chair to Bed - Progress: Goal set today Pt will Ambulate: 51 - 150 feet;with supervision;with rolling walker PT Goal: Ambulate - Progress: Goal set today Pt will Go Up / Down Stairs: 1-2 stairs;with min assist;with rolling walker PT Goal: Up/Down Stairs - Progress: Goal set today Pt will Perform Home Exercise Program: with supervision, verbal cues required/provided PT Goal: Perform Home Exercise Program - Progress: Goal set today  Visit Information  Last PT Received On: 08/31/12    Subjective Data  Subjective: Agree to PT eval  Patient Stated Goal: Return to Work.   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 Communication: No difficulties  Cognition  Cognition Overall Cognitive Status: Appears within functional limits for tasks assessed/performed Arousal/Alertness: Awake/alert Orientation Level: Appears intact for tasks assessed Behavior During Session: [facility] for tasks performed    Extremity/Trunk Assessment Right Lower Extremity Assessment RLE ROM/Strength/Tone: Unable to fully assess;Due to pain;Due to precautions Left Lower Extremity Assessment LLE ROM/Strength/Tone: Within functional levels Trunk Assessment Trunk Assessment: Normal   Balance Balance Balance Assessed: No  End of Session PT - End of Session Equipment Utilized During Treatment: Gait belt;Right knee immobilizer Activity Tolerance: Patient tolerated treatment well Patient left: in chair;with call bell/phone within reach Nurse Communication: Mobility status CPM Right Knee CPM Right Knee: Off Additional Comments: CPM off at 1520  GP     Jose Corvin 08/31/2012, 5:21 PM Carla Whilden L. Morton Simson DPT [phone]

## 2012-08-31 NOTE — Brief Op Note (Signed)
08/31/2012  5:20 PM  PATIENT:  Jackie Lynch  72 y.o. female  PRE-OPERATIVE DIAGNOSIS:  ENDSTAGE DEGENERTIVE JOINT DISEASE  POST-OPERATIVE DIAGNOSIS:  ENDSTAGE DEGENERTIVE JOINT DISEASE  PROCEDURE:  Procedure(s): TOTAL KNEE ARTHROPLASTY (Right)  SURGEON:  Surgeon(s) and Role:    * Harvie Junior, MD - Primary  PHYSICIAN ASSISTANT:   ASSISTANTS: bethune   ANESTHESIA:   general  EBL:  Total I/O In: 1800 [P.O.:250; I.V.:1550] Out: 1450 [Urine:1050; Drains:300; Blood:100]  BLOOD ADMINISTERED:none  DRAINS: (1) Hemovact drain(s) in the r knee with  Suction Open   LOCAL MEDICATIONS USED:  MARCAINE     SPECIMEN:  No Specimen  DISPOSITION OF SPECIMEN:  N/A  COUNTS:  YES  TOURNIQUET:   Total Tourniquet Time Documented: Thigh (Right) - 49 minutes Total: Thigh (Right) - 49 minutes   DICTATION: .Other Dictation: Dictation Number [id number]  PLAN OF CARE: Admit to inpatient   PATIENT DISPOSITION:  PACU - hemodynamically stable.   Delay start of Pharmacological VTE agent (>24hrs) due to surgical blood loss or risk of bleeding: no

## 2012-08-31 NOTE — Progress Notes (Signed)
Orthopedic Tech Progress Note Patient Details:  Jackie Lynch [birth year] [id number]  CPM Right Knee CPM Right Knee: On Right Knee Flexion (Degrees): 60 Right Knee Extension (Degrees): 0 Additional Comments: TRAPEZE BAR   Shawnie Pons 08/31/2012, 11:58 AM

## 2012-08-31 NOTE — Anesthesia Preprocedure Evaluation (Addendum)
Anesthesia Evaluation  Patient identified by MRN, date of birth, ID band Patient awake    Reviewed: Allergy & Precautions, H&P , NPO status , Patient's Chart, lab work & pertinent test results, reviewed documented beta blocker date and time   History of Anesthesia Complications (+) PONV  Airway Mallampati: I TM Distance: >3 FB Neck ROM: Full    Dental  (+) Teeth Intact and Partial Upper   Pulmonary  breath sounds clear to auscultation        Cardiovascular hypertension, Pt. on medications and Pt. on home beta blockers Rhythm:Regular Rate:Normal     Neuro/Psych    GI/Hepatic   Endo/Other    Renal/GU      Musculoskeletal   Abdominal   Peds  Hematology   Anesthesia Other Findings   Reproductive/Obstetrics                          Anesthesia Physical Anesthesia Plan  ASA: II  Anesthesia Plan: General   Post-op Pain Management:    Induction: Intravenous  Airway Management Planned: LMA  Additional Equipment:   Intra-op Plan:   Post-operative Plan: Extubation in OR  Informed Consent: I have reviewed the patients History and Physical, chart, labs and discussed the procedure including the risks, benefits and alternatives for the proposed anesthesia with the patient or authorized representative who has indicated his/her understanding and acceptance.   Dental advisory given  Plan Discussed with: CRNA, Anesthesiologist and Surgeon  Anesthesia Plan Comments:         Anesthesia Quick Evaluation

## 2012-09-01 LAB — BASIC METABOLIC PANEL
CO2: 28 mEq/L (ref 19–32)
Calcium: 8.7 mg/dL (ref 8.4–10.5)
Creatinine, Ser: 1.11 mg/dL — ABNORMAL HIGH (ref 0.50–1.10)
GFR calc Af Amer: 57 mL/min — ABNORMAL LOW (ref >90)
GFR calc non Af Amer: 49 mL/min — ABNORMAL LOW (ref >90)
Sodium: 137 mEq/L (ref 135–145)

## 2012-09-01 LAB — CBC
MCH: 29.3 pg (ref 26.0–34.0)
MCHC: 33.3 g/dL (ref 30.0–36.0)
MCV: 87.8 fL (ref 78.0–100.0)
Platelets: 196 10*3/uL (ref 150–400)
RBC: 3.45 MIL/uL — ABNORMAL LOW (ref 3.87–5.11)
RDW: 13.7 % (ref 11.5–15.5)

## 2012-09-01 LAB — PROTIME-INR: Prothrombin Time: 15.4 seconds — ABNORMAL HIGH (ref 11.6–15.2)

## 2012-09-01 MED ORDER — WARFARIN SODIUM 2.5 MG PO TABS
2.5000 mg | ORAL_TABLET | Freq: Once | ORAL | Status: AC
  Administered 2012-09-01: 2.5 mg via ORAL
  Filled 2012-09-01: qty 1

## 2012-09-01 NOTE — Op Note (Signed)
NAMEFRANKIE, ZITO NO.:  [id number]  MEDICAL RECORD NO.:  [id number]  LOCATION:  5N12C                        FACILITY:  [facility]  PHYSICIAN:  Harvie Junior, M.D.   DATE OF BIRTH:  [birth year]  DATE OF PROCEDURE:  08/31/2012 DATE OF DISCHARGE:                              OPERATIVE REPORT   PREOPERATIVE DIAGNOSIS:  End-stage degenerative joint disease, right knee.  POSTOPERATIVE DIAGNOSIS:  End-stage degenerative joint disease, right knee.  PRINCIPAL PROCEDURE:  Right total knee replacement with a Sigma system, size 2 femur, size 2 tibia, 15-mm bridging bearing, and a 32-mm all polyethylene patella.  SURGEON:  Harvie Junior, M.D.  ASSISTANT:  Marshia Ly, P.A.  ANESTHESIA:  General.  BRIEF HISTORY:  Ms. Boivin is a 72 year old female with history of having significant complaints of right knee pain.  She has been treated conservatively for prolonged period of time.  After failure of all conservative care, she was taken to the operating room for right total knee replacement.  She had night pain and mild activity-related pain and had failed conservative care.  PROCEDURE:  The patient was taken to the operating room and after adequate anesthesia was obtained with general anesthetic, the patient was placed supine on the operating table.  The right leg was then prepped and draped in usual sterile fashion.  Following this, the leg was exsanguinated.  Blood pressure tourniquet was inflated to 350 mmHg. Following this, a midline incision was made, subcutaneous tissue down the level of the extensor mechanism and a medial parapatellar arthrotomy was undertaken.  Once this was completed, attention was turned to the removal of medial and lateral meniscus, retropatellar fat pad, and synovium on the anterior aspect of the femur, and anterior and posterior cruciate.  Following this, extramedullary guide was used, and the tibia was cut perpendicular to its long  axis, 2-mm below the low point on the tibia.  Following this, with a 5-degree valgus cut, the femur was cut perpendicular to the anatomic axis, a 5-degree valgus cut with an intramedullary guide.  Following this, the spacer block was put in place.  The knee was easily came out to full extension.  Following this, attention was turned towards measuring the femur and measured at 2. Anterior and posterior cuts were made, chamfers and box.  Attention was turned to the tibia, sized to a 2, drilled and keeled.  Following this, the trial components were placed, 12.5 poly was placed and felt pretty good, felt like it could have been a little bit of mid-range instability, but felt pretty good.  Attention was then turned to the patella, which cut down to a level of 13 mm, drilled for a 32 patella and this was drilled in place.  Once this was completed, attention was turned back up to the knee where it was put through a range of motion, excellent stability, and range of motion was achieved at this point. Following this, the trial components were removed.  The knee was copiously and thoroughly lavaged and suctioned dry.  The final components were then cemented into place, size 2 femur, size 2 tibia, a 12.5-mm bridging bearing trial was placed, and the patella  was cemented in place and held with a clamp.  Following this, the cement was allowed to harden.  Once the cement was completely hard, the tourniquet was let down.  All bleeding was controlled with electrocautery.  I trialed a 12.5 poly and felt pretty good.  There was no rotational issues.  I tried a 15 poly, could still good full extension and gave a little bit better flexion stability.  So, at this point, a 15-mm poly was opened and placed, and once that was done, a medium Hemovac drain was placed followed by closure of the medial parapatellar arthrotomy with a 1 Vicryl running suture, 0 and 2-0 Vicryl and 3-0 Monocryl subcuticular. Benzoin and  Steri-Strips were applied.  Sterile compressive dressing was applied.  The patient was taken to the recovery room, she was noted to be in satisfactory condition.  Estimated blood loss for the procedure was minimal.     Harvie Junior, M.D.     Ranae Plumber  D:  08/31/2012  T:  09/01/2012  Job:  [id number]

## 2012-09-01 NOTE — Progress Notes (Signed)
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 Bee Venom Swelling    Vital Signs: Temp: 97.6 F (36.4 C) (03/01 0600) BP: 134/59 mmHg (03/01 0600) Pulse Rate: 68 (03/01 0600)  Labs:  Recent Labs  08/31/12 0559 09/01/12 0740  HGB 12.9 10.1*  HCT 39.0 30.3*  PLT 232 196  APTT 27  --   LABPROT 12.6 15.4*  INR 0.95 1.24  CREATININE  --  1.11*    The CrCl is unknown because both a height and weight (above a minimum accepted value) are required for this calculation.  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   Noted patient had a L THA in May 2013 and INR was SUPRAtherapeutic with 2 doses of coumadin 5mg  during her hospital stay. Patient does not recall coumadin dose upon her d/c after her THA.  CBC consistent with post-op anemia. No bleeding noted. INR today 1.24. Will continue with low dose coumadin with patient's history of sensitivity to this medication.    Goal of Therapy:  INR 1.5-2  Monitor platelets by anticoagulation protocol: Yes   Plan:  Coumadin 2.5mg  po x 1 dose today  F/u PT/INR tomorrow  Thank you,  Brett Fairy, PharmD, BCPS 09/01/2012 10:28 AM

## 2012-09-01 NOTE — Progress Notes (Signed)
Physical Therapy Treatment Patient Details Name: Jackie Lynch MRN: [id number] DOB: July 27, 1940 Today's Date: 09/01/2012 Time: 2951-8841 PT Time Calculation (min): 29 min  PT Assessment / Plan / Recommendation Comments on Treatment Session  Pt continues to make excellent progress in mobiity.      Follow Up Recommendations  Home health PT     Does the patient have the potential to tolerate intense rehabilitation     Barriers to Discharge        Equipment Recommendations  None recommended by PT    Recommendations for Other Services    Frequency 7X/week   Plan Discharge plan remains appropriate;Frequency remains appropriate    Precautions / Restrictions Precautions Precautions: Knee Restrictions Weight Bearing Restrictions: Yes RLE Weight Bearing: Weight bearing as tolerated   Pertinent Vitals/Pain 3/10 pain in knee pt medicated prior to session.     Mobility  Bed Mobility Bed Mobility: Supine to Sit Supine to Sit: 5: Supervision;HOB flat Details for Bed Mobility Assistance: pt able to manage RLE independently.  Transfers Transfers: Sit to Stand;Stand to Sit Sit to Stand: 5: Supervision Stand to Sit: 5: Supervision Details for Transfer Assistance: Vcs for technique Ambulation/Gait Ambulation/Gait Assistance: 5: Supervision Ambulation Distance (Feet): 90 Feet Assistive device: Rolling walker Gait Pattern: Step-to pattern;Decreased stride length;Trunk flexed Stairs: No    Exercises Total Joint Exercises Ankle Circles/Pumps: 10 reps;Both   PT Diagnosis:    PT Problem List:   PT Treatment Interventions:     PT Goals Acute Rehab PT Goals PT Goal Formulation: With patient/family Time For Goal Achievement: 09/07/12 Potential to Achieve Goals: Good Pt will go Supine/Side to Sit: with modified independence PT Goal: Supine/Side to Sit - Progress: Progressing toward goal Pt will go Sit to Supine/Side: with modified independence PT Goal: Sit to Supine/Side - Progress:  Progressing toward goal Pt will Transfer Bed to Chair/Chair to Bed: with supervision PT Transfer Goal: Bed to Chair/Chair to Bed - Progress: Progressing toward goal Pt will Ambulate: 51 - 150 feet;with supervision;with rolling walker PT Goal: Ambulate - Progress: Progressing toward goal Pt will Go Up / Down Stairs: 1-2 stairs;with min assist;with rolling walker Pt will Perform Home Exercise Program: with supervision, verbal cues required/provided PT Goal: Perform Home Exercise Program - Progress: Progressing toward goal  Visit Information  Last PT Received On: 09/01/12 Assistance Needed: +1    Subjective Data  Subjective: I feel pretty good Patient Stated Goal: Return to Work.   Cognition  Cognition Overall Cognitive Status: Appears within functional limits for tasks assessed/performed Arousal/Alertness: Awake/alert Orientation Level: Appears intact for tasks assessed Behavior During Session: [facility] for tasks performed    Balance     End of Session PT - End of Session Equipment Utilized During Treatment: Gait belt;Right knee immobilizer Activity Tolerance: Patient tolerated treatment well Patient left: in chair;with call bell/phone within reach Nurse Communication: Mobility status CPM Right Knee CPM Right Knee: Off   GP     Daila Elbert 09/01/2012, 1:22 PM Porchia Sinkler L. Shalayah Beagley DPT [phone]

## 2012-09-01 NOTE — Progress Notes (Signed)
PATIENT ID: Jackie Lynch  MRN: [id number]  DOB/AGE:  Nov 06, 1940 / 72 y.o.  1 Day Post-Op Procedure(s) (LRB): TOTAL KNEE ARTHROPLASTY (Right)    PROGRESS NOTE Subjective: Patient is alert, oriented, no Nausea, no Vomiting, yes passing gas, no Bowel Movement. Taking PO well. Denies SOB, Chest or Calf Pain. Using Incentive Spirometer, PAS in place. Patient reports pain as moderate  .    Objective: Vital signs in last 24 hours: Filed Vitals:   08/31/12 1430 08/31/12 2129 09/01/12 0145 09/01/12 0600  BP: 108/72 136/63 123/64 134/59  Pulse: 68 81 77 68  Temp: 97.5 F (36.4 C) 97.9 F (36.6 C) 97.6 F (36.4 C) 97.6 F (36.4 C)  TempSrc:      Resp: 18 18 16 18   SpO2: 100% 96% 97% 98%      Intake/Output from previous day: I/O last 3 completed shifts: In: 2428.8 [P.O.:250; I.V.:2128.8; IV Piggyback:50] Out: 2650 [Urine:2250; Drains:300; Blood:100]   Intake/Output this shift:     LABORATORY DATA:  Recent Labs  08/31/12 0559 09/01/12 0740  WBC 7.0 12.6*  HGB 12.9 10.1*  HCT 39.0 30.3*  PLT 232 196  INR 0.95 1.24    Examination: Neurologically intact ABD soft Neurovascular intact Sensation intact distally Intact pulses distally Dorsiflexion/Plantar flexion intact Incision: dressing C/D/I} Drain pulled - plasma separation noted  Assessment:   1 Day Post-Op Procedure(s) (LRB): TOTAL KNEE ARTHROPLASTY (Right) ADDITIONAL DIAGNOSIS:  none  Plan: PT/OT WBAT, CPM 5/hrs day until ROM 0-90 degrees, then D/C CPM DVT Prophylaxis:  SCDx72hrs, Coumadin DISCHARGE PLAN: Home DISCHARGE NEEDS: HHPT, HHRN, CPM, Walker and 3-in-1 comode seat     Kimmberly Wisser M. 09/01/2012, 8:44 AM

## 2012-09-01 NOTE — Progress Notes (Signed)
Physical Therapy Treatment Patient Details Name: Jackie Lynch MRN: [id number] DOB: [birth year] Today's Date: 09/01/2012 Time: 4098-1191 PT Time Calculation (min): 30 min  PT Assessment / Plan / Recommendation Comments on Treatment Session  Instructed pt to enter bed using step stool as pt state her bed at home is too high for her to get into.  Will work on stairs next session.  Pt should be safe to d/c home tomorrow.     Follow Up Recommendations  Home health PT     Does the patient have the potential to tolerate intense rehabilitation     Barriers to Discharge        Equipment Recommendations  None recommended by PT    Recommendations for Other Services    Frequency 7X/week   Plan Discharge plan remains appropriate;Frequency remains appropriate    Precautions / Restrictions Precautions Precautions: Knee Restrictions Weight Bearing Restrictions: Yes RLE Weight Bearing: Weight bearing as tolerated   Pertinent Vitals/Pain 6/10 pain in knee pt medicated prior to session.    Mobility  Bed Mobility Bed Mobility: Supine to Sit;Sit to Supine Supine to Sit: 5: Supervision;HOB flat Sit to Supine: 4: Min assist Details for Bed Mobility Assistance: assist for RLE Transfers Transfers: Sit to Stand;Stand to Sit Sit to Stand: 5: Supervision Stand to Sit: 5: Supervision Details for Transfer Assistance: Vcs for technique Ambulation/Gait Ambulation/Gait Assistance: 5: Supervision Ambulation Distance (Feet): 40 Feet Assistive device: Rolling walker Gait Pattern: Step-to pattern;Decreased stride length;Trunk flexed    Exercises     PT Diagnosis:    PT Problem List:   PT Treatment Interventions:     PT Goals Acute Rehab PT Goals PT Goal Formulation: With patient/family Time For Goal Achievement: 09/07/12 Potential to Achieve Goals: Good Pt will go Supine/Side to Sit: with modified independence PT Goal: Supine/Side to Sit - Progress: Progressing toward goal Pt will go Sit to  Supine/Side: with modified independence PT Goal: Sit to Supine/Side - Progress: Progressing toward goal Pt will Transfer Bed to Chair/Chair to Bed: with supervision PT Transfer Goal: Bed to Chair/Chair to Bed - Progress: Met Pt will Ambulate: 51 - 150 feet;with supervision;with rolling walker PT Goal: Ambulate - Progress: Progressing toward goal Pt will Go Up / Down Stairs: 1-2 stairs;with min assist;with rolling walker Pt will Perform Home Exercise Program: with supervision, verbal cues required/provided PT Goal: Perform Home Exercise Program - Progress: Progressing toward goal  Visit Information  Last PT Received On: 09/01/12    Subjective Data  Subjective: I have had a rough day of pain.    Cognition  Cognition Overall Cognitive Status: Appears within functional limits for tasks assessed/performed Arousal/Alertness: Awake/alert Orientation Level: Appears intact for tasks assessed Behavior During Session: [facility] for tasks performed    Balance     End of Session PT - End of Session Equipment Utilized During Treatment: Gait belt;Right knee immobilizer Activity Tolerance: Patient tolerated treatment well Patient left: with call bell/phone within reach;in bed Nurse Communication: Mobility status   GP     Jackie Lynch 09/01/2012, 6:56 PM Jackie Champagne L. Jackie Lynch DPT [phone]

## 2012-09-02 LAB — CBC
MCH: 29.6 pg (ref 26.0–34.0)
MCHC: 33.5 g/dL (ref 30.0–36.0)
Platelets: 162 10*3/uL (ref 150–400)
RDW: 13.9 % (ref 11.5–15.5)

## 2012-09-02 LAB — PROTIME-INR
INR: 2.34 — ABNORMAL HIGH (ref 0.00–1.49)
Prothrombin Time: 24.6 seconds — ABNORMAL HIGH (ref 11.6–15.2)

## 2012-09-02 MED ORDER — ONDANSETRON HCL 4 MG PO TABS: 4.0000 mg | ORAL_TABLET | Freq: Four times a day (QID) | ORAL | Status: DC | PRN

## 2012-09-02 NOTE — Progress Notes (Signed)
Physical Therapy Treatment Patient Details Name: Jackie Lynch MRN: [id number] DOB: March 01, 1941 Today's Date: 09/02/2012 Time: 1478-2956 PT Time Calculation (min): 34 min  PT Assessment / Plan / Recommendation Comments on Treatment Session  Pt very pleasant & willing to participate in therapy.  Making steady progress.  Performed 1 step 2x's to mimic entering/exiting home.      Follow Up Recommendations  Home health PT     Does the patient have the potential to tolerate intense rehabilitation     Barriers to Discharge        Equipment Recommendations  None recommended by PT    Recommendations for Other Services    Frequency 7X/week   Plan Discharge plan remains appropriate;Frequency remains appropriate    Precautions / Restrictions Precautions Precautions: Knee Restrictions Weight Bearing Restrictions: Yes RLE Weight Bearing: Weight bearing as tolerated       Mobility  Bed Mobility Bed Mobility: Not assessed Transfers Transfers: Sit to Stand;Stand to Sit Sit to Stand: 5: Supervision;With upper extremity assist;With armrests;From chair/3-in-1;From toilet Stand to Sit: 6: Modified independent (Device/Increase time);With upper extremity assist;With armrests;To chair/3-in-1;To toilet Ambulation/Gait Ambulation/Gait Assistance: 5: Supervision Ambulation Distance (Feet): 60 Feet Assistive device: Rolling walker Gait Pattern: Step-to pattern;Decreased stance time - right;Decreased step length - left;Decreased step length - right;Decreased hip/knee flexion - right Stairs: Yes Stairs Assistance: 4: Min guard Stairs Assistance Details (indicate cue type and reason): cues for technique.  Negotiated 1 step using backwards technique.   Stair Management Technique: No rails;Backwards;With walker Number of Stairs: 1 (2x's) Wheelchair Mobility Wheelchair Mobility: No    Exercises Total Joint Exercises Ankle Circles/Pumps: AROM;Both;10 reps Quad Sets: AROM;Both;10 reps Straight Leg  Raises: Right;10 reps;AAROM    PT Goals Acute Rehab PT Goals Time For Goal Achievement: 09/07/12 Potential to Achieve Goals: Good Pt will go Supine/Side to Sit: with modified independence Pt will go Sit to Supine/Side: with modified independence Pt will Transfer Bed to Chair/Chair to Bed: with supervision Pt will Ambulate: 51 - 150 feet;with supervision;with rolling walker PT Goal: Ambulate - Progress: Progressing toward goal Pt will Go Up / Down Stairs: 1-2 stairs;with min assist;with rolling walker PT Goal: Up/Down Stairs - Progress: Progressing toward goal Pt will Perform Home Exercise Program: with supervision, verbal cues required/provided PT Goal: Perform Home Exercise Program - Progress: Progressing toward goal  Visit Information  Last PT Received On: 09/02/12 Assistance Needed: +1    Subjective Data      Cognition  Cognition Overall Cognitive Status: Appears within functional limits for tasks assessed/performed Arousal/Alertness: Awake/alert Orientation Level: Appears intact for tasks assessed Behavior During Session: [facility] for tasks performed    Balance     End of Session PT - End of Session Equipment Utilized During Treatment: Gait belt;Right knee immobilizer Patient left: with call bell/phone within reach;in bed Nurse Communication: Mobility status CPM Right Knee CPM Right Knee: [address]     [place], [place] [phone] 09/02/2012

## 2012-09-02 NOTE — Plan of Care (Signed)
Problem: Consults Goal: Diagnosis- Total Joint Replacement Outcome: Progressing Hemiarthroplasty     

## 2012-09-02 NOTE — Progress Notes (Signed)
PATIENT ID: Jackie Lynch  MRN: [id number]  DOB/AGE:  Sep 17, 1940 / 72 y.o.  2 Days Post-Op Procedure(s) (LRB): TOTAL KNEE ARTHROPLASTY (Right)    PROGRESS NOTE Subjective: Patient is alert, oriented, no Nausea, no Vomiting, yes passing gas, no Bowel Movement. Taking PO well. Denies SOB, Chest or Calf Pain. Using Incentive Spirometer, PAS in place. Ambulating well with PT. Patient reports pain as moderate  .    Objective: Vital signs in last 24 hours: Filed Vitals:   09/01/12 1326 09/01/12 2259 09/02/12 0457 09/02/12 0653  BP: 163/76 156/77  159/76  Pulse: 77 75  70  Temp: 98.3 F (36.8 C) 98.9 F (37.2 C)  98.9 F (37.2 C)  TempSrc:      Resp: 18 18  18   Height:   5' (1.524 m)   Weight:   75.751 kg (167 lb)   SpO2: 97% 98%  99%      Intake/Output from previous day: I/O last 3 completed shifts: In: 1628.8 [P.O.:1000; I.V.:578.8; IV Piggyback:50] Out: 1200 [Urine:1200]   Intake/Output this shift:     LABORATORY DATA:  Recent Labs  09/01/12 0740 09/02/12 0729  WBC 12.6* 8.1  HGB 10.1* 9.5*  HCT 30.3* 28.4*  PLT 196 162  NA 137  --   K 3.0*  --   CL 99  --   CO2 28  --   BUN 29*  --   CREATININE 1.11*  --   GLUCOSE 152*  --   INR 1.24 2.34*  CALCIUM 8.7  --     Examination: Neurologically intact ABD soft Neurovascular intact Sensation intact distally Intact pulses distally Dorsiflexion/Plantar flexion intact Incision: scant drainage}  Assessment:   2 Days Post-Op Procedure(s) (LRB): TOTAL KNEE ARTHROPLASTY (Right) ADDITIONAL DIAGNOSIS:  none  Plan: PT/OT WBAT, CPM 5/hrs day until ROM 0-90 degrees, then D/C CPM DVT Prophylaxis:  SCDx72hrs, ASA 325 mg BID x 2 weeks DISCHARGE PLAN: Home today DISCHARGE NEEDS: HHPT, HHRN, CPM, Walker and 3-in-1 comode seat     WILLIAMS,JENNIFER M. 09/02/2012, 9:26 AM

## 2012-09-02 NOTE — Progress Notes (Signed)
Dressing changed to R knee as ordered.  Incision dry to R knee.  No drainage present.  No redness. Minimal swelling.  Dry dressing reapplied to knee.  Ted hose in place.  Knee immobilizer in place.  Discharge instructions reviewed with patient.  Hard copies of Zofran, Coumadin and Oxycodone given to patient.  Patient verbalized understanding of all instructions.  Alert and oriented x4.  Medicated for pain prior to discharge as patient requested.  Pain rated on 1/10 scale as a 6 to R knee.  All belongings packed by family members.    Kelli Hope M

## 2012-09-02 NOTE — Discharge Summary (Signed)
Patient ID: Jackie Lynch MRN: [id number] DOB/AGE: [birth year] 72 y.o.  Admit date: 08/31/2012 Discharge date: 09/02/2012  Admission Diagnoses:  Principal Problem:   Osteoarthritis of right knee   Discharge Diagnoses:  Same  Past Medical History  Diagnosis Date  . PONV (postoperative nausea and vomiting)   . Hyperthyroidism   . Hypertension   . GERD (gastroesophageal reflux disease)   . Arthritis   . 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 Constipation   . Diverticulosis of colon   . Renal cyst, congenital, right   . Nocturia   . Hyperlipidemia   . Anemia   . H/O osteopenia     Surgeries: Procedure(s): TOTAL KNEE ARTHROPLASTY on 08/31/2012   Consultants:    Discharged Condition: Improved  Hospital Course: DEMIRA GWYNNE is an 72 y.o. female who was admitted 08/31/2012 for operative treatment ofOsteoarthritis of right knee. Patient has severe unremitting pain that affects sleep, daily activities, and work/hobbies. After pre-op clearance the patient was taken to the operating room on 08/31/2012 and underwent  Procedure(s): TOTAL KNEE ARTHROPLASTY.    Patient was given perioperative antibiotics: Anti-infectives   Start     Dose/Rate Route Frequency Ordered Stop   08/31/12 1300  ceFAZolin (ANCEF) IVPB 2 g/50 mL premix     2 g 100 mL/hr over 30 Minutes Intravenous Every 6 hours 08/31/12 1045 08/31/12 2125   08/31/12 0600  ceFAZolin (ANCEF) IVPB 2 g/50 mL premix     2 g 100 mL/hr over 30 Minutes Intravenous On call to O.R. 08/30/12 1423 08/31/12 0715       Patient was given sequential compression devices, early ambulation, and chemoprophylaxis to prevent DVT.  Patient benefited maximally from hospital stay and there were no complications.    Recent vital signs: Patient Vitals for the past 24 hrs:  BP Temp Pulse Resp SpO2 Height Weight  09/02/12 0653 159/76 mmHg 98.9 F (37.2 C) 70 18 99 % - -  09/02/12 0457 - - - - - 5' (1.524 m) 75.751 kg (167 lb)   09/01/12 2259 156/77 mmHg 98.9 F (37.2 C) 75 18 98 % - -  09/01/12 1326 163/76 mmHg 98.3 F (36.8 C) 77 18 97 % - -  09/01/12 1105 - - 76 - - - -  09/01/12 1102 140/70 mmHg - - - - - -     Recent laboratory studies:  Recent Labs  09/01/12 0740 09/02/12 0729  WBC 12.6* 8.1  HGB 10.1* 9.5*  HCT 30.3* 28.4*  PLT 196 162  NA 137  --   K 3.0*  --   CL 99  --   CO2 28  --   BUN 29*  --   CREATININE 1.11*  --   GLUCOSE 152*  --   INR 1.24 2.34*  CALCIUM 8.7  --      Discharge Medications:     Medication List    STOP taking these medications       acetaminophen 500 MG tablet  Commonly known as:  TYLENOL      TAKE these medications       amLODipine 10 MG tablet  Commonly known as:  NORVASC  Take 10 mg by mouth daily.     CALCIUM-VITAMIN D PO  Take 1 tablet by mouth daily.     cloNIDine 0.1 MG tablet  Commonly known as:  CATAPRES  Take 0.1 mg by mouth daily.     docusate sodium 100 MG capsule  Commonly known  as:  COLACE  Take 100 mg by mouth daily.     fenofibrate 160 MG tablet  Take 160 mg by mouth daily.     hydrochlorothiazide 25 MG tablet  Commonly known as:  HYDRODIURIL  Take 25 mg by mouth daily.     hydrocortisone 25 MG suppository  Commonly known as:  ANUSOL-HC  Place 25 mg rectally daily as needed for hemorrhoids.     losartan 100 MG tablet  Commonly known as:  COZAAR  Take 100 mg by mouth daily.     methimazole 10 MG tablet  Commonly known as:  TAPAZOLE  Take 10 mg by mouth 3 (three) times a week.     metoprolol 50 MG tablet  Commonly known as:  LOPRESSOR  Take 50 mg by mouth 2 (two) times daily.     omeprazole 40 MG capsule  Commonly known as:  PRILOSEC  Take 40 mg by mouth 2 (two) times daily.     ondansetron 4 MG tablet  Commonly known as:  ZOFRAN  Take 1 tablet (4 mg total) by mouth every 6 (six) hours as needed for nausea.     oxyCODONE-acetaminophen 5-325 MG per tablet  Commonly known as:  PERCOCET/ROXICET  Take 1-2  tablets by mouth every 6 (six) hours as needed for pain.     pravastatin 80 MG tablet  Commonly known as:  PRAVACHOL  Take 80 mg by mouth daily.     PROBIOTIC FORMULA PO  Take 1 tablet by mouth daily.     warfarin 2.5 MG tablet  Commonly known as:  COUMADIN  One daily unless otherwise directed. Shoot for INR of 1.5-2.0. Treat x 1 month post op.        Diagnostic Studies: Dg Chest 2 View  08/20/2012  *RADIOLOGY REPORT*  Clinical Data: Preop for knee replacement  CHEST - 2 VIEW  Comparison: Chest x-ray of 11/10/2011  Findings: No active infiltrate or effusion is seen.  Mediastinal contours appear normal.  The heart is mildly enlarged and stable. No acute skeletal abnormality is noted.  IMPRESSION: Stable chest x-ray.  No active lung disease.   Original Report Authenticated By: Dwyane Dee, M.D.     Disposition: 06-Home-Health Care Svc      Discharge Orders   Future Orders Complete By Expires     Call MD for:  redness, tenderness, or signs of infection (pain, swelling, redness, odor or green/yellow discharge around incision site)  As directed     Call MD for:  severe uncontrolled pain  As directed     Call MD for:  temperature >100.4  As directed     Change dressing (specify)  As directed     Comments:      Dressing change as needed.    Discharge instructions  As directed     Comments:      F/U with Dr. Luiz Blare as scheduled.    Driving Restrictions  As directed     Comments:      No driving for 2 weeks.    Increase activity slowly  As directed     May shower / Bathe  As directed     Walker   As directed        Follow-up Information   Follow up with GRAVES,JOHN L, MD. Schedule an appointment as soon as possible for a visit in 2 weeks.   Contact information:   [address] [place] [address] [phone]  SignedHazle Nordmann. 09/02/2012, 9:29 AM

## 2012-09-03 ENCOUNTER — Encounter (HOSPITAL_COMMUNITY): Payer: Self-pay | Admitting: Orthopedic Surgery

## 2012-09-03 NOTE — Progress Notes (Signed)
Patient was preoperative set up with [facility] for [facility]. NCM did follow up with [facility] and faxed facesheet, dc summary and orders for [facility]. They are will schedule to see pt today or in am. NCM spoke to pt and states she did receive her CPM machine, RW and 3n1 from [facility]. Isidoro Donning RN CCM Case Mgmt phone [phone]

## 2012-09-05 DIAGNOSIS — Z7901 Long term (current) use of anticoagulants: Secondary | ICD-10-CM | POA: Diagnosis not present

## 2012-09-05 DIAGNOSIS — R262 Difficulty in walking, not elsewhere classified: Secondary | ICD-10-CM | POA: Diagnosis not present

## 2012-09-05 DIAGNOSIS — E039 Hypothyroidism, unspecified: Secondary | ICD-10-CM | POA: Diagnosis not present

## 2012-09-05 DIAGNOSIS — Z471 Aftercare following joint replacement surgery: Secondary | ICD-10-CM | POA: Diagnosis not present

## 2012-09-05 DIAGNOSIS — R269 Unspecified abnormalities of gait and mobility: Secondary | ICD-10-CM | POA: Diagnosis not present

## 2012-09-05 DIAGNOSIS — I10 Essential (primary) hypertension: Secondary | ICD-10-CM | POA: Diagnosis not present

## 2012-09-05 DIAGNOSIS — E785 Hyperlipidemia, unspecified: Secondary | ICD-10-CM | POA: Diagnosis not present

## 2012-09-05 DIAGNOSIS — Z96659 Presence of unspecified artificial knee joint: Secondary | ICD-10-CM | POA: Diagnosis not present

## 2012-09-05 DIAGNOSIS — Z5181 Encounter for therapeutic drug level monitoring: Secondary | ICD-10-CM | POA: Diagnosis not present

## 2012-09-05 DIAGNOSIS — M1990 Unspecified osteoarthritis, unspecified site: Secondary | ICD-10-CM | POA: Diagnosis not present

## 2012-09-10 DIAGNOSIS — I10 Essential (primary) hypertension: Secondary | ICD-10-CM | POA: Diagnosis not present

## 2012-09-10 DIAGNOSIS — R269 Unspecified abnormalities of gait and mobility: Secondary | ICD-10-CM | POA: Diagnosis not present

## 2012-09-10 DIAGNOSIS — M1990 Unspecified osteoarthritis, unspecified site: Secondary | ICD-10-CM | POA: Diagnosis not present

## 2012-09-10 DIAGNOSIS — Z5181 Encounter for therapeutic drug level monitoring: Secondary | ICD-10-CM | POA: Diagnosis not present

## 2012-09-10 DIAGNOSIS — R262 Difficulty in walking, not elsewhere classified: Secondary | ICD-10-CM | POA: Diagnosis not present

## 2012-09-10 DIAGNOSIS — Z471 Aftercare following joint replacement surgery: Secondary | ICD-10-CM | POA: Diagnosis not present

## 2012-09-12 DIAGNOSIS — Z5181 Encounter for therapeutic drug level monitoring: Secondary | ICD-10-CM | POA: Diagnosis not present

## 2012-09-12 DIAGNOSIS — M1990 Unspecified osteoarthritis, unspecified site: Secondary | ICD-10-CM | POA: Diagnosis not present

## 2012-09-12 DIAGNOSIS — I10 Essential (primary) hypertension: Secondary | ICD-10-CM | POA: Diagnosis not present

## 2012-09-12 DIAGNOSIS — R269 Unspecified abnormalities of gait and mobility: Secondary | ICD-10-CM | POA: Diagnosis not present

## 2012-09-12 DIAGNOSIS — R262 Difficulty in walking, not elsewhere classified: Secondary | ICD-10-CM | POA: Diagnosis not present

## 2012-09-12 DIAGNOSIS — Z471 Aftercare following joint replacement surgery: Secondary | ICD-10-CM | POA: Diagnosis not present

## 2012-09-13 DIAGNOSIS — M1710 Unilateral primary osteoarthritis, unspecified knee: Secondary | ICD-10-CM | POA: Diagnosis not present

## 2012-09-14 DIAGNOSIS — R269 Unspecified abnormalities of gait and mobility: Secondary | ICD-10-CM | POA: Diagnosis not present

## 2012-09-14 DIAGNOSIS — I10 Essential (primary) hypertension: Secondary | ICD-10-CM | POA: Diagnosis not present

## 2012-09-14 DIAGNOSIS — M1990 Unspecified osteoarthritis, unspecified site: Secondary | ICD-10-CM | POA: Diagnosis not present

## 2012-09-14 DIAGNOSIS — R262 Difficulty in walking, not elsewhere classified: Secondary | ICD-10-CM | POA: Diagnosis not present

## 2012-09-14 DIAGNOSIS — Z5181 Encounter for therapeutic drug level monitoring: Secondary | ICD-10-CM | POA: Diagnosis not present

## 2012-09-14 DIAGNOSIS — Z471 Aftercare following joint replacement surgery: Secondary | ICD-10-CM | POA: Diagnosis not present

## 2012-09-17 DIAGNOSIS — I10 Essential (primary) hypertension: Secondary | ICD-10-CM | POA: Diagnosis not present

## 2012-09-17 DIAGNOSIS — M1990 Unspecified osteoarthritis, unspecified site: Secondary | ICD-10-CM | POA: Diagnosis not present

## 2012-09-17 DIAGNOSIS — R269 Unspecified abnormalities of gait and mobility: Secondary | ICD-10-CM | POA: Diagnosis not present

## 2012-09-17 DIAGNOSIS — Z471 Aftercare following joint replacement surgery: Secondary | ICD-10-CM | POA: Diagnosis not present

## 2012-09-17 DIAGNOSIS — R262 Difficulty in walking, not elsewhere classified: Secondary | ICD-10-CM | POA: Diagnosis not present

## 2012-09-17 DIAGNOSIS — Z5181 Encounter for therapeutic drug level monitoring: Secondary | ICD-10-CM | POA: Diagnosis not present

## 2012-09-18 DIAGNOSIS — M1990 Unspecified osteoarthritis, unspecified site: Secondary | ICD-10-CM | POA: Diagnosis not present

## 2012-09-18 DIAGNOSIS — R262 Difficulty in walking, not elsewhere classified: Secondary | ICD-10-CM | POA: Diagnosis not present

## 2012-09-18 DIAGNOSIS — Z471 Aftercare following joint replacement surgery: Secondary | ICD-10-CM | POA: Diagnosis not present

## 2012-09-18 DIAGNOSIS — Z5181 Encounter for therapeutic drug level monitoring: Secondary | ICD-10-CM | POA: Diagnosis not present

## 2012-09-18 DIAGNOSIS — R269 Unspecified abnormalities of gait and mobility: Secondary | ICD-10-CM | POA: Diagnosis not present

## 2012-09-18 DIAGNOSIS — I10 Essential (primary) hypertension: Secondary | ICD-10-CM | POA: Diagnosis not present

## 2012-09-19 DIAGNOSIS — Z5181 Encounter for therapeutic drug level monitoring: Secondary | ICD-10-CM | POA: Diagnosis not present

## 2012-09-19 DIAGNOSIS — R262 Difficulty in walking, not elsewhere classified: Secondary | ICD-10-CM | POA: Diagnosis not present

## 2012-09-19 DIAGNOSIS — I10 Essential (primary) hypertension: Secondary | ICD-10-CM | POA: Diagnosis not present

## 2012-09-19 DIAGNOSIS — M1990 Unspecified osteoarthritis, unspecified site: Secondary | ICD-10-CM | POA: Diagnosis not present

## 2012-09-19 DIAGNOSIS — Z471 Aftercare following joint replacement surgery: Secondary | ICD-10-CM | POA: Diagnosis not present

## 2012-09-19 DIAGNOSIS — R269 Unspecified abnormalities of gait and mobility: Secondary | ICD-10-CM | POA: Diagnosis not present

## 2012-09-20 DIAGNOSIS — Z471 Aftercare following joint replacement surgery: Secondary | ICD-10-CM | POA: Diagnosis not present

## 2012-09-20 DIAGNOSIS — M1990 Unspecified osteoarthritis, unspecified site: Secondary | ICD-10-CM | POA: Diagnosis not present

## 2012-09-20 DIAGNOSIS — I10 Essential (primary) hypertension: Secondary | ICD-10-CM | POA: Diagnosis not present

## 2012-09-20 DIAGNOSIS — R269 Unspecified abnormalities of gait and mobility: Secondary | ICD-10-CM | POA: Diagnosis not present

## 2012-09-20 DIAGNOSIS — Z5181 Encounter for therapeutic drug level monitoring: Secondary | ICD-10-CM | POA: Diagnosis not present

## 2012-09-20 DIAGNOSIS — R262 Difficulty in walking, not elsewhere classified: Secondary | ICD-10-CM | POA: Diagnosis not present

## 2012-09-21 DIAGNOSIS — M1990 Unspecified osteoarthritis, unspecified site: Secondary | ICD-10-CM | POA: Diagnosis not present

## 2012-09-21 DIAGNOSIS — Z471 Aftercare following joint replacement surgery: Secondary | ICD-10-CM | POA: Diagnosis not present

## 2012-09-21 DIAGNOSIS — I10 Essential (primary) hypertension: Secondary | ICD-10-CM | POA: Diagnosis not present

## 2012-09-21 DIAGNOSIS — Z5181 Encounter for therapeutic drug level monitoring: Secondary | ICD-10-CM | POA: Diagnosis not present

## 2012-09-21 DIAGNOSIS — R262 Difficulty in walking, not elsewhere classified: Secondary | ICD-10-CM | POA: Diagnosis not present

## 2012-09-21 DIAGNOSIS — R269 Unspecified abnormalities of gait and mobility: Secondary | ICD-10-CM | POA: Diagnosis not present

## 2012-09-24 DIAGNOSIS — M1990 Unspecified osteoarthritis, unspecified site: Secondary | ICD-10-CM | POA: Diagnosis not present

## 2012-09-24 DIAGNOSIS — I10 Essential (primary) hypertension: Secondary | ICD-10-CM | POA: Diagnosis not present

## 2012-09-24 DIAGNOSIS — Z471 Aftercare following joint replacement surgery: Secondary | ICD-10-CM | POA: Diagnosis not present

## 2012-09-24 DIAGNOSIS — R269 Unspecified abnormalities of gait and mobility: Secondary | ICD-10-CM | POA: Diagnosis not present

## 2012-09-24 DIAGNOSIS — Z5181 Encounter for therapeutic drug level monitoring: Secondary | ICD-10-CM | POA: Diagnosis not present

## 2012-09-24 DIAGNOSIS — R262 Difficulty in walking, not elsewhere classified: Secondary | ICD-10-CM | POA: Diagnosis not present

## 2012-09-26 DIAGNOSIS — R262 Difficulty in walking, not elsewhere classified: Secondary | ICD-10-CM | POA: Diagnosis not present

## 2012-09-26 DIAGNOSIS — Z471 Aftercare following joint replacement surgery: Secondary | ICD-10-CM | POA: Diagnosis not present

## 2012-09-26 DIAGNOSIS — Z5181 Encounter for therapeutic drug level monitoring: Secondary | ICD-10-CM | POA: Diagnosis not present

## 2012-09-26 DIAGNOSIS — M1990 Unspecified osteoarthritis, unspecified site: Secondary | ICD-10-CM | POA: Diagnosis not present

## 2012-09-26 DIAGNOSIS — R269 Unspecified abnormalities of gait and mobility: Secondary | ICD-10-CM | POA: Diagnosis not present

## 2012-09-26 DIAGNOSIS — I10 Essential (primary) hypertension: Secondary | ICD-10-CM | POA: Diagnosis not present

## 2012-09-27 DIAGNOSIS — M1710 Unilateral primary osteoarthritis, unspecified knee: Secondary | ICD-10-CM | POA: Diagnosis not present

## 2012-09-28 DIAGNOSIS — Z471 Aftercare following joint replacement surgery: Secondary | ICD-10-CM | POA: Diagnosis not present

## 2012-09-28 DIAGNOSIS — Z5181 Encounter for therapeutic drug level monitoring: Secondary | ICD-10-CM | POA: Diagnosis not present

## 2012-09-28 DIAGNOSIS — I10 Essential (primary) hypertension: Secondary | ICD-10-CM | POA: Diagnosis not present

## 2012-09-28 DIAGNOSIS — R262 Difficulty in walking, not elsewhere classified: Secondary | ICD-10-CM | POA: Diagnosis not present

## 2012-09-28 DIAGNOSIS — M1990 Unspecified osteoarthritis, unspecified site: Secondary | ICD-10-CM | POA: Diagnosis not present

## 2012-09-28 DIAGNOSIS — R269 Unspecified abnormalities of gait and mobility: Secondary | ICD-10-CM | POA: Diagnosis not present

## 2012-10-01 DIAGNOSIS — M1710 Unilateral primary osteoarthritis, unspecified knee: Secondary | ICD-10-CM | POA: Diagnosis not present

## 2012-10-01 DIAGNOSIS — Z96659 Presence of unspecified artificial knee joint: Secondary | ICD-10-CM | POA: Diagnosis not present

## 2012-10-08 DIAGNOSIS — Z96659 Presence of unspecified artificial knee joint: Secondary | ICD-10-CM | POA: Diagnosis not present

## 2012-10-08 DIAGNOSIS — M1710 Unilateral primary osteoarthritis, unspecified knee: Secondary | ICD-10-CM | POA: Diagnosis not present

## 2012-10-11 DIAGNOSIS — Z96659 Presence of unspecified artificial knee joint: Secondary | ICD-10-CM | POA: Diagnosis not present

## 2012-10-11 DIAGNOSIS — M1710 Unilateral primary osteoarthritis, unspecified knee: Secondary | ICD-10-CM | POA: Diagnosis not present

## 2012-10-15 DIAGNOSIS — M1710 Unilateral primary osteoarthritis, unspecified knee: Secondary | ICD-10-CM | POA: Diagnosis not present

## 2012-10-15 DIAGNOSIS — Z96659 Presence of unspecified artificial knee joint: Secondary | ICD-10-CM | POA: Diagnosis not present

## 2012-10-17 DIAGNOSIS — M1710 Unilateral primary osteoarthritis, unspecified knee: Secondary | ICD-10-CM | POA: Diagnosis not present

## 2012-10-17 DIAGNOSIS — Z96659 Presence of unspecified artificial knee joint: Secondary | ICD-10-CM | POA: Diagnosis not present

## 2012-10-23 DIAGNOSIS — Z96659 Presence of unspecified artificial knee joint: Secondary | ICD-10-CM | POA: Diagnosis not present

## 2012-10-23 DIAGNOSIS — M1710 Unilateral primary osteoarthritis, unspecified knee: Secondary | ICD-10-CM | POA: Diagnosis not present

## 2012-10-25 ENCOUNTER — Ambulatory Visit: Payer: Medicare Other | Admitting: Family Medicine

## 2012-10-25 DIAGNOSIS — M1710 Unilateral primary osteoarthritis, unspecified knee: Secondary | ICD-10-CM | POA: Diagnosis not present

## 2012-10-25 DIAGNOSIS — Z96659 Presence of unspecified artificial knee joint: Secondary | ICD-10-CM | POA: Diagnosis not present

## 2012-10-29 DIAGNOSIS — Z96659 Presence of unspecified artificial knee joint: Secondary | ICD-10-CM | POA: Diagnosis not present

## 2012-10-29 DIAGNOSIS — M1710 Unilateral primary osteoarthritis, unspecified knee: Secondary | ICD-10-CM | POA: Diagnosis not present

## 2012-10-31 DIAGNOSIS — Z96659 Presence of unspecified artificial knee joint: Secondary | ICD-10-CM | POA: Diagnosis not present

## 2012-10-31 DIAGNOSIS — M1710 Unilateral primary osteoarthritis, unspecified knee: Secondary | ICD-10-CM | POA: Diagnosis not present

## 2012-11-01 ENCOUNTER — Encounter: Payer: Self-pay | Admitting: Family Medicine

## 2012-11-01 ENCOUNTER — Ambulatory Visit
Admission: RE | Admit: 2012-11-01 | Discharge: 2012-11-01 | Disposition: A | Discharge status: Home / Self Care | Payer: Medicare Other | Source: Ambulatory Visit | Attending: Family Medicine | Admitting: Family Medicine

## 2012-11-01 ENCOUNTER — Ambulatory Visit (INDEPENDENT_AMBULATORY_CARE_PROVIDER_SITE_OTHER): Payer: Medicare Other | Admitting: Family Medicine

## 2012-11-01 VITALS — BP 130/78 | HR 66 | Temp 98.0°F | Resp 16 | Wt 158.0 lb

## 2012-11-01 DIAGNOSIS — R059 Cough, unspecified: Secondary | ICD-10-CM | POA: Diagnosis not present

## 2012-11-01 DIAGNOSIS — J45909 Unspecified asthma, uncomplicated: Secondary | ICD-10-CM | POA: Diagnosis not present

## 2012-11-01 DIAGNOSIS — R05 Cough: Secondary | ICD-10-CM | POA: Diagnosis not present

## 2012-11-01 MED ORDER — PREDNISONE 20 MG PO TABS: ORAL_TABLET | ORAL | Status: DC

## 2012-11-01 MED ORDER — ALBUTEROL SULFATE HFA 108 (90 BASE) MCG/ACT IN AERS: 2.0000 | INHALATION_SPRAY | Freq: Four times a day (QID) | RESPIRATORY_TRACT | Status: DC | PRN

## 2012-11-01 NOTE — Progress Notes (Signed)
Subjective:    Patient ID: Jackie Lynch, female    DOB: 1941-03-27, 72 y.o.   MRN: [id number]  HPI Patient reports 2 weeks of worsening cough. She is reporting increasing shortness of breath. She denies any fever. The cough is nonproductive. She reports tightness in her chest with inspiration. She denies hemoptysis. She denies pleurisy. She has no history of asthma. She does not smoke. However she has a long exposure to secondhand smoke through her husband Past Medical History  Diagnosis Date  . PONV (postoperative nausea and vomiting)   . Hyperthyroidism   . Hypertension   . GERD (gastroesophageal reflux disease)   . Arthritis   . 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 Constipation   . Diverticulosis of colon   . Renal cyst, congenital, right   . Nocturia   . Hyperlipidemia   . Anemia   . H/O osteopenia    Current Outpatient Prescriptions on File Prior to Visit  Medication Sig Dispense Refill  . amLODipine (NORVASC) 10 MG tablet Take 10 mg by mouth daily.      Marland Kitchen CALCIUM-VITAMIN D PO Take 1 tablet by mouth daily.      . cloNIDine (CATAPRES) 0.1 MG tablet Take 0.1 mg by mouth daily.      Marland Kitchen docusate sodium (COLACE) 100 MG capsule Take 100 mg by mouth daily.      . fenofibrate 160 MG tablet Take 160 mg by mouth daily.      . hydrochlorothiazide (HYDRODIURIL) 25 MG tablet Take 25 mg by mouth daily.      . hydrocortisone (ANUSOL-HC) 25 MG suppository Place 25 mg rectally daily as needed for hemorrhoids.      Marland Kitchen losartan (COZAAR) 100 MG tablet Take 100 mg by mouth daily.      . methimazole (TAPAZOLE) 10 MG tablet Take 10 mg by mouth 3 (three) times a week.       . metoprolol (LOPRESSOR) 50 MG tablet Take 50 mg by mouth 2 (two) times daily.       Marland Kitchen omeprazole (PRILOSEC) 40 MG capsule Take 40 mg by mouth 2 (two) times daily.      . ondansetron (ZOFRAN) 4 MG tablet Take 1 tablet (4 mg total) by mouth every 6 (six) hours as needed for nausea.  40 tablet  0  .  oxyCODONE-acetaminophen (PERCOCET/ROXICET) 5-325 MG per tablet Take 1-2 tablets by mouth every 6 (six) hours as needed for pain.  60 tablet  0  . pravastatin (PRAVACHOL) 80 MG tablet Take 80 mg by mouth daily.      . Probiotic Product (PROBIOTIC FORMULA PO) Take 1 tablet by mouth daily.       No current facility-administered medications on file prior to visit.   Allergies  Allergen Reactions  . Bee Venom Swelling      Review of Systems  All other systems reviewed and are negative.       Objective:   Physical Exam  Vitals reviewed. Constitutional: She appears well-developed and well-nourished.  Cardiovascular: Normal rate, regular rhythm, normal heart sounds and intact distal pulses.   No murmur heard. Pulmonary/Chest: Effort normal. No respiratory distress. She has decreased breath sounds in the right upper field, the right lower field, the left upper field and the left lower field. She has wheezes.  Abdominal: Soft. Bowel sounds are normal.          Assessment & Plan:  1. Asthmatic bronchitis Begin prednisone 60 mg by mouth daily for  5 days. Also again albuterol 2 puffs inhaled every 6 hours when necessary wheezing. Advised patient to go get a chest x-ray to rule out subclinical infection. Recheck in one week, sooner if worse. Advised her to avoid secondhand tobacco smoke. - DG Chest 2 View; Future - predniSONE (DELTASONE) 20 MG tablet; 3 tabs per day for 5 days  Dispense: 15 tablet; Refill: 0 - albuterol (PROVENTIL HFA;VENTOLIN HFA) 108 (90 BASE) MCG/ACT inhaler; Inhale 2 puffs into the lungs every 6 (six) hours as needed for wheezing.  Dispense: 1 Inhaler; Refill: 0

## 2012-11-01 NOTE — Progress Notes (Signed)
Patient aware.

## 2012-11-12 DIAGNOSIS — E0520 Thyrotoxicosis with toxic multinodular goiter without thyrotoxic crisis or storm: Secondary | ICD-10-CM | POA: Diagnosis not present

## 2012-11-20 DIAGNOSIS — E0520 Thyrotoxicosis with toxic multinodular goiter without thyrotoxic crisis or storm: Secondary | ICD-10-CM | POA: Diagnosis not present

## 2012-11-20 DIAGNOSIS — I10 Essential (primary) hypertension: Secondary | ICD-10-CM | POA: Diagnosis not present

## 2012-12-17 DIAGNOSIS — M25569 Pain in unspecified knee: Secondary | ICD-10-CM | POA: Diagnosis not present

## 2012-12-17 DIAGNOSIS — M19079 Primary osteoarthritis, unspecified ankle and foot: Secondary | ICD-10-CM | POA: Diagnosis not present

## 2012-12-20 ENCOUNTER — Telehealth: Payer: Self-pay | Admitting: Family Medicine

## 2012-12-20 NOTE — Telephone Encounter (Signed)
Patient aware and appt made 

## 2012-12-20 NOTE — Telephone Encounter (Signed)
Not otc, be glad to see her to see if ear infection or otitis externa

## 2012-12-21 ENCOUNTER — Ambulatory Visit (INDEPENDENT_AMBULATORY_CARE_PROVIDER_SITE_OTHER): Payer: Medicare Other | Admitting: Family Medicine

## 2012-12-21 ENCOUNTER — Encounter: Payer: Self-pay | Admitting: Family Medicine

## 2012-12-21 VITALS — BP 110/74 | HR 62 | Temp 97.8°F | Resp 18 | Wt 156.0 lb

## 2012-12-21 DIAGNOSIS — H9202 Otalgia, left ear: Secondary | ICD-10-CM

## 2012-12-21 DIAGNOSIS — E785 Hyperlipidemia, unspecified: Secondary | ICD-10-CM

## 2012-12-21 DIAGNOSIS — H9209 Otalgia, unspecified ear: Secondary | ICD-10-CM | POA: Diagnosis not present

## 2012-12-21 DIAGNOSIS — E0590 Thyrotoxicosis, unspecified without thyrotoxic crisis or storm: Secondary | ICD-10-CM

## 2012-12-21 LAB — COMPLETE METABOLIC PANEL WITH GFR
ALT: 11 U/L (ref 0–35)
AST: 17 U/L (ref 0–37)
Albumin: 4.1 g/dL (ref 3.5–5.2)
Alkaline Phosphatase: 33 U/L — ABNORMAL LOW (ref 39–117)
BUN: 15 mg/dL (ref 6–23)
Potassium: 3.5 mEq/L (ref 3.5–5.3)
Sodium: 137 mEq/L (ref 135–145)
Total Protein: 6.6 g/dL (ref 6.0–8.3)

## 2012-12-21 LAB — LIPID PANEL
Cholesterol: 179 mg/dL (ref 0–200)
Total CHOL/HDL Ratio: 3.6 Ratio
VLDL: 21 mg/dL (ref 0–40)

## 2012-12-21 MED ORDER — MELOXICAM 15 MG PO TABS: 15.0000 mg | ORAL_TABLET | Freq: Every day | ORAL | Status: DC

## 2012-12-21 MED ORDER — DIAZEPAM 5 MG PO TABS: 5.0000 mg | ORAL_TABLET | Freq: Every evening | ORAL | Status: DC | PRN

## 2012-12-21 NOTE — Progress Notes (Signed)
Subjective:    Patient ID: Jackie Lynch, female    DOB: 09/12/1940, 72 y.o.   MRN: [id number]  HPI  Patient complains of left ear pain for one week. 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 No one was ever really sure what is the source of her pain. However it had improved up until about a week ago.  He reports a throbbing sensation just anterior to her left ear that radiates down into her left jaw near the angle of the mandible. She does report some pain in her jaw chewing. She for severe pain. She denies sore throat. She denies a dilatated. She denies any fevers. There is no lymphadenopathy in the neck. She has not recently had a cold are gone swimming. Past Medical History  Diagnosis Date  . PONV (postoperative nausea and vomiting)   . Hyperthyroidism   . Hypertension   . GERD (gastroesophageal reflux disease)   . Arthritis   . 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 Constipation   . Diverticulosis of colon   . Renal cyst, congenital, right   . Nocturia   . Hyperlipidemia   . Anemia   . H/O osteopenia    Current Outpatient Prescriptions on File Prior to Visit  Medication Sig Dispense Refill  . albuterol (PROVENTIL HFA;VENTOLIN HFA) 108 (90 BASE) MCG/ACT inhaler Inhale 2 puffs into the lungs every 6 (six) hours as needed for wheezing.  1 Inhaler  0  . amLODipine (NORVASC) 10 MG tablet Take 10 mg by mouth daily.      Marland Kitchen CALCIUM-VITAMIN D PO Take 1 tablet by mouth daily.      . cloNIDine (CATAPRES) 0.1 MG tablet Take 0.1 mg by mouth daily.      Marland Kitchen docusate sodium (COLACE) 100 MG capsule Take 100 mg by mouth daily.      . fenofibrate 160 MG tablet Take 160 mg by mouth daily.      . hydrochlorothiazide (HYDRODIURIL) 25 MG tablet Take 25 mg by mouth daily.      Marland Kitchen losartan (COZAAR) 100 MG tablet Take 100 mg by mouth daily.      . methimazole (TAPAZOLE) 10 MG tablet Take 10 mg by mouth 3 (three) times a week.       . metoprolol  (LOPRESSOR) 50 MG tablet Take 50 mg by mouth 2 (two) times daily.       Marland Kitchen omeprazole (PRILOSEC) 40 MG capsule Take 40 mg by mouth 2 (two) times daily.      . ondansetron (ZOFRAN) 4 MG tablet Take 1 tablet (4 mg total) by mouth every 6 (six) hours as needed for nausea.  40 tablet  0  . oxyCODONE-acetaminophen (PERCOCET/ROXICET) 5-325 MG per tablet Take 1-2 tablets by mouth every 6 (six) hours as needed for pain.  60 tablet  0  . pravastatin (PRAVACHOL) 80 MG tablet Take 80 mg by mouth daily.      . Probiotic Product (PROBIOTIC FORMULA PO) Take 1 tablet by mouth daily.       No current facility-administered medications on file prior to visit.   Allergies  Allergen Reactions  . Bee Venom Swelling   History   Social History  . Marital Status: Married    Spouse Name: N/A    Number of Children: N/A  . Years of Education: N/A   Occupational History  . Not on file.   Social History Main Topics  . Smoking status: Never  Smoker   . Smokeless tobacco: Never Used  . Alcohol Use: No  . Drug Use: No  . Sexually Active: No   Other Topics Concern  . Not on file   Social History Narrative  . No narrative on file     Review of Systems  All other systems reviewed and are negative.       Objective:   Physical Exam  Vitals reviewed. Constitutional: She appears well-developed and well-nourished.  HENT:  Head: Normocephalic and atraumatic.  Right Ear: Hearing, tympanic membrane, external ear and ear canal normal.  Left Ear: Hearing, tympanic membrane, external ear and ear canal normal.  Nose: Nose normal.  Mouth/Throat: Oropharynx is clear and moist. No oropharyngeal exudate.  Eyes: Conjunctivae and EOM are normal. Pupils are equal, round, and reactive to light. Right eye exhibits no discharge. Left eye exhibits no discharge. No scleral icterus.  Neck: Normal range of motion. Neck supple. No JVD present. No thyromegaly present.  Lymphadenopathy:    She has no cervical adenopathy.           Assessment & Plan:  1. HLD (hyperlipidemia) She is due for fasting lab work - COMPLETE METABOLIC PANEL WITH GFR - Lipid panel  2. Hyperthyroidism Did recheck her TSH to make sure she is on appropriate dose of Tapazole. - TSH  3. Otalgia of left ear Differential diagnosis includes TMJ versus trigeminal neuralgia. I began by treating TMJ with Valium 5 mg by mouth each bedtime and Mobic 15 mg by mouth daily. I've asked the patient use this for 2 weeks and return for reevaluation. She is to return immediately if symptoms change or worsen.

## 2013-01-14 DIAGNOSIS — Z1231 Encounter for screening mammogram for malignant neoplasm of breast: Secondary | ICD-10-CM | POA: Diagnosis not present

## 2013-02-06 ENCOUNTER — Encounter: Payer: Self-pay | Admitting: Family Medicine

## 2013-02-27 ENCOUNTER — Other Ambulatory Visit: Payer: Self-pay | Admitting: Family Medicine

## 2013-03-19 DIAGNOSIS — M25569 Pain in unspecified knee: Secondary | ICD-10-CM | POA: Diagnosis not present

## 2013-03-26 ENCOUNTER — Ambulatory Visit (INDEPENDENT_AMBULATORY_CARE_PROVIDER_SITE_OTHER): Payer: Medicare Other | Admitting: Family Medicine

## 2013-03-26 DIAGNOSIS — Z23 Encounter for immunization: Secondary | ICD-10-CM | POA: Diagnosis not present

## 2013-04-04 DIAGNOSIS — H2510 Age-related nuclear cataract, unspecified eye: Secondary | ICD-10-CM | POA: Diagnosis not present

## 2013-04-04 DIAGNOSIS — H04129 Dry eye syndrome of unspecified lacrimal gland: Secondary | ICD-10-CM | POA: Diagnosis not present

## 2013-05-01 DIAGNOSIS — L739 Follicular disorder, unspecified: Secondary | ICD-10-CM | POA: Diagnosis not present

## 2013-05-01 DIAGNOSIS — L821 Other seborrheic keratosis: Secondary | ICD-10-CM | POA: Diagnosis not present

## 2013-05-01 DIAGNOSIS — D239 Other benign neoplasm of skin, unspecified: Secondary | ICD-10-CM | POA: Diagnosis not present

## 2013-05-21 ENCOUNTER — Other Ambulatory Visit: Payer: Self-pay | Admitting: Family Medicine

## 2013-05-21 MED ORDER — OMEPRAZOLE 40 MG PO CPDR: 40.0000 mg | DELAYED_RELEASE_CAPSULE | Freq: Two times a day (BID) | ORAL | Status: DC

## 2013-05-21 NOTE — Telephone Encounter (Signed)
Rx Refilled  

## 2013-07-16 ENCOUNTER — Telehealth: Payer: Self-pay | Admitting: Family Medicine

## 2013-07-16 DIAGNOSIS — K625 Hemorrhage of anus and rectum: Secondary | ICD-10-CM | POA: Diagnosis not present

## 2013-07-16 DIAGNOSIS — K5900 Constipation, unspecified: Secondary | ICD-10-CM | POA: Diagnosis not present

## 2013-07-16 DIAGNOSIS — K219 Gastro-esophageal reflux disease without esophagitis: Secondary | ICD-10-CM | POA: Diagnosis not present

## 2013-07-16 DIAGNOSIS — K5730 Diverticulosis of large intestine without perforation or abscess without bleeding: Secondary | ICD-10-CM | POA: Diagnosis not present

## 2013-07-16 NOTE — Telephone Encounter (Signed)
Call back number is [phone] PT is wanting to know if [facility] could be called for arthritis  Pharmacy [facility] on [address]

## 2013-07-18 MED ORDER — TRAMADOL HCL 50 MG PO TABS
50.0000 mg | ORAL_TABLET | Freq: Four times a day (QID) | ORAL | Status: DC | PRN
Start: 2013-07-18 — End: 2015-05-12

## 2013-07-18 NOTE — Telephone Encounter (Signed)
Med called out and pt aware 

## 2013-07-18 NOTE — Telephone Encounter (Signed)
Okay with tramadol 50 mg poq 6 hrs prn pain 30 tabs.

## 2013-09-12 DIAGNOSIS — M6530 Trigger finger, unspecified finger: Secondary | ICD-10-CM | POA: Diagnosis not present

## 2013-09-19 DIAGNOSIS — M6530 Trigger finger, unspecified finger: Secondary | ICD-10-CM | POA: Diagnosis not present

## 2013-10-16 DIAGNOSIS — M6530 Trigger finger, unspecified finger: Secondary | ICD-10-CM | POA: Diagnosis not present

## 2013-10-29 DIAGNOSIS — M6530 Trigger finger, unspecified finger: Secondary | ICD-10-CM | POA: Diagnosis not present

## 2013-11-18 DIAGNOSIS — E0520 Thyrotoxicosis with toxic multinodular goiter without thyrotoxic crisis or storm: Secondary | ICD-10-CM | POA: Diagnosis not present

## 2013-11-20 DIAGNOSIS — E0520 Thyrotoxicosis with toxic multinodular goiter without thyrotoxic crisis or storm: Secondary | ICD-10-CM | POA: Diagnosis not present

## 2013-11-20 DIAGNOSIS — I10 Essential (primary) hypertension: Secondary | ICD-10-CM | POA: Diagnosis not present

## 2013-12-24 ENCOUNTER — Other Ambulatory Visit: Payer: Self-pay | Admitting: Family Medicine

## 2013-12-24 ENCOUNTER — Encounter: Payer: Self-pay | Admitting: Family Medicine

## 2013-12-24 NOTE — Telephone Encounter (Signed)
Medication refill for one time only.  Patient needs to be seen.  Letter sent for patient to call and schedule.  Pt has not been seen in 1 year

## 2014-01-10 ENCOUNTER — Encounter: Payer: Self-pay | Admitting: Family Medicine

## 2014-01-10 ENCOUNTER — Ambulatory Visit (INDEPENDENT_AMBULATORY_CARE_PROVIDER_SITE_OTHER): Payer: Medicare Other | Admitting: Family Medicine

## 2014-01-10 VITALS — BP 132/76 | HR 60 | Temp 98.8°F | Resp 16 | Ht 60.0 in | Wt 172.0 lb

## 2014-01-10 DIAGNOSIS — R82998 Other abnormal findings in urine: Secondary | ICD-10-CM

## 2014-01-10 DIAGNOSIS — I10 Essential (primary) hypertension: Secondary | ICD-10-CM | POA: Diagnosis not present

## 2014-01-10 DIAGNOSIS — E785 Hyperlipidemia, unspecified: Secondary | ICD-10-CM | POA: Diagnosis not present

## 2014-01-10 LAB — URINALYSIS, ROUTINE W REFLEX MICROSCOPIC
BILIRUBIN URINE: NEGATIVE
GLUCOSE, UA: NEGATIVE mg/dL
Hgb urine dipstick: NEGATIVE
Ketones, ur: NEGATIVE mg/dL
LEUKOCYTES UA: NEGATIVE
Nitrite: NEGATIVE
Protein, ur: NEGATIVE mg/dL
SPECIFIC GRAVITY, URINE: 1.015 (ref 1.005–1.030)
Urobilinogen, UA: 1 mg/dL (ref 0.0–1.0)
pH: 7 (ref 5.0–8.0)

## 2014-01-10 MED ORDER — DIAZEPAM 5 MG PO TABS: 5.0000 mg | ORAL_TABLET | Freq: Three times a day (TID) | ORAL | Status: DC | PRN

## 2014-01-10 NOTE — Progress Notes (Signed)
Subjective:    Patient ID: Jackie Lynch, female    DOB: 05/16/1941, 73 y.o.   MRN: [id number]  HPI Patient has a history of hypertension and hyperlipidemia. She is overdue for fasting lab work to check her cholesterol. Her goal LDL cholesterol is less than 130. She denies any myalgias or right upper quadrant pain. She denies any chest pain or shortness of breath or dyspnea on exertion. Her blood pressure is well-controlled today at 132/76. Her thyroid is monitored by her endocrinologist. Past Medical History  Diagnosis Date  . PONV (postoperative nausea and vomiting)   . Hyperthyroidism   . Hypertension   . GERD (gastroesophageal reflux disease)   . Arthritis   . 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 Constipation   . Diverticulosis of colon   . Renal cyst, congenital, right   . Nocturia   . Hyperlipidemia   . Anemia   . H/O osteopenia    Current Outpatient Prescriptions on File Prior to Visit  Medication Sig Dispense Refill  . albuterol (PROVENTIL HFA;VENTOLIN HFA) 108 (90 BASE) MCG/ACT inhaler Inhale 2 puffs into the lungs every 6 (six) hours as needed for wheezing.  1 Inhaler  0  . amLODipine (NORVASC) 10 MG tablet Take 1 tablet by mouth  every day  90 tablet  3  . CALCIUM-VITAMIN D PO Take 1 tablet by mouth daily.      . cloNIDine (CATAPRES) 0.1 MG tablet Take 1 tablet by mouth  twice a day  60 tablet  0  . docusate sodium (COLACE) 100 MG capsule Take 100 mg by mouth daily.      . fenofibrate 160 MG tablet Take 1 tablet by mouth  every day  90 tablet  3  . hydrochlorothiazide (HYDRODIURIL) 25 MG tablet Take 1 tablet by mouth  every day  90 tablet  3  . losartan (COZAAR) 100 MG tablet Take 1 tablet by mouth  every day  90 tablet  3  . meloxicam (MOBIC) 15 MG tablet TAKE ONE TABLET BY MOUTH ONCE DAILY  30 tablet  3  . methimazole (TAPAZOLE) 10 MG tablet Take 10 mg by mouth 3 (three) times a week.       . metoprolol (LOPRESSOR) 50 MG tablet Take 1 tablet by  mouth  twice a day  180 tablet  3  . omeprazole (PRILOSEC) 40 MG capsule Take 1 capsule (40 mg total) by mouth 2 (two) times daily.  180 capsule  4  . ondansetron (ZOFRAN) 4 MG tablet Take 1 tablet (4 mg total) by mouth every 6 (six) hours as needed for nausea.  40 tablet  0  . oxyCODONE-acetaminophen (PERCOCET/ROXICET) 5-325 MG per tablet Take 1-2 tablets by mouth every 6 (six) hours as needed for pain.  60 tablet  0  . pravastatin (PRAVACHOL) 80 MG tablet Take 1 tablet by mouth  every day  90 tablet  3  . Probiotic Product (PROBIOTIC FORMULA PO) Take 1 tablet by mouth daily.      . traMADol (ULTRAM) 50 MG tablet Take 1 tablet (50 mg total) by mouth every 6 (six) hours as needed.  30 tablet  0   No current facility-administered medications on file prior to visit.   Past Surgical History  Procedure Laterality Date  . Cholecystectomy    . Tubal ligation    . Knee arthroscopy      right and left  . Abdominal hysterectomy    . Total hip arthroplasty  11/14/2011    Procedure: TOTAL HIP ARTHROPLASTY;  Surgeon: Alta Corning, MD;  Location: [facility];  Service: Orthopedics;  Laterality: Left;  . Joint replacement    . Total knee arthroplasty Right 08/31/2012    Procedure: TOTAL KNEE ARTHROPLASTY;  Surgeon: Alta Corning, MD;  Location: [facility];  Service: Orthopedics;  Laterality: Right;   Allergies  Allergen Reactions  . Bee Venom Swelling   History   Social History  . Marital Status: Married    Spouse Name: N/A    Number of Children: N/A  . Years of Education: N/A   Occupational History  . Not on file.   Social History Main Topics  . Smoking status: Never Smoker   . Smokeless tobacco: Never Used  . Alcohol Use: No  . Drug Use: No  . Sexual Activity: No   Other Topics Concern  . Not on file   Social History Narrative  . No narrative on file      Review of Systems  All other systems reviewed and are negative.      Objective:   Physical Exam  Vitals reviewed. Neck: Neck  supple. No JVD present. No thyromegaly present.  Cardiovascular: Normal rate, regular rhythm and normal heart sounds.  Exam reveals no gallop.   No murmur heard. Pulmonary/Chest: Effort normal and breath sounds normal. No respiratory distress. She has no wheezes. She has no rales.  Abdominal: Soft. Bowel sounds are normal. She exhibits no distension. There is no tenderness. There is no rebound and no guarding.  Lymphadenopathy:    She has no cervical adenopathy.          Assessment & Plan:  1. Dark urine Urinalysis is normal. I have recommended that she increase her water consumption due to possible dehydration - Urinalysis, Routine w reflex microscopic  2. HLD (hyperlipidemia) Check CMP and fasting lipid panel.  Goal LDL cholesterol is less than 130. - COMPLETE METABOLIC PANEL WITH GFR - Lipid panel - CBC with Differential  3. Essential hypertension Blood pressure is excellent. Continue current medications at their present dosages.

## 2014-01-16 ENCOUNTER — Other Ambulatory Visit: Payer: Medicare Other

## 2014-01-16 DIAGNOSIS — E785 Hyperlipidemia, unspecified: Secondary | ICD-10-CM | POA: Diagnosis not present

## 2014-01-16 DIAGNOSIS — Z803 Family history of malignant neoplasm of breast: Secondary | ICD-10-CM | POA: Diagnosis not present

## 2014-01-16 DIAGNOSIS — Z1231 Encounter for screening mammogram for malignant neoplasm of breast: Secondary | ICD-10-CM | POA: Diagnosis not present

## 2014-01-16 LAB — COMPLETE METABOLIC PANEL WITH GFR
ALK PHOS: 31 U/L — AB (ref 39–117)
ALT: 17 U/L (ref 0–35)
AST: 21 U/L (ref 0–37)
Albumin: 4.2 g/dL (ref 3.5–5.2)
BUN: 21 mg/dL (ref 6–23)
CO2: 27 meq/L (ref 19–32)
Calcium: 9.7 mg/dL (ref 8.4–10.5)
Chloride: 103 mEq/L (ref 96–112)
Creat: 0.99 mg/dL (ref 0.50–1.10)
GFR, EST AFRICAN AMERICAN: 66 mL/min
GFR, Est Non African American: 58 mL/min — ABNORMAL LOW
Glucose, Bld: 108 mg/dL — ABNORMAL HIGH (ref 70–99)
Potassium: 4.4 mEq/L (ref 3.5–5.3)
SODIUM: 140 meq/L (ref 135–145)
TOTAL PROTEIN: 6.6 g/dL (ref 6.0–8.3)
Total Bilirubin: 0.6 mg/dL (ref 0.2–1.2)

## 2014-01-16 LAB — LIPID PANEL
CHOL/HDL RATIO: 3.1 ratio
Cholesterol: 160 mg/dL (ref 0–200)
HDL: 52 mg/dL (ref >39)
LDL CALC: 91 mg/dL (ref 0–99)
Triglycerides: 87 mg/dL (ref <150)
VLDL: 17 mg/dL (ref 0–40)

## 2014-01-20 ENCOUNTER — Encounter: Payer: Self-pay | Admitting: Family Medicine

## 2014-02-17 ENCOUNTER — Encounter: Payer: Self-pay | Admitting: Family Medicine

## 2014-02-23 IMAGING — CR DG CHEST 2V
2 series · 2 of 2 positions shown · non-contrast
Comparison: None.

CLINICAL DATA: Preop for left hip replacement

CHEST - 2 VIEW

[view not recorded (1 of 2)]
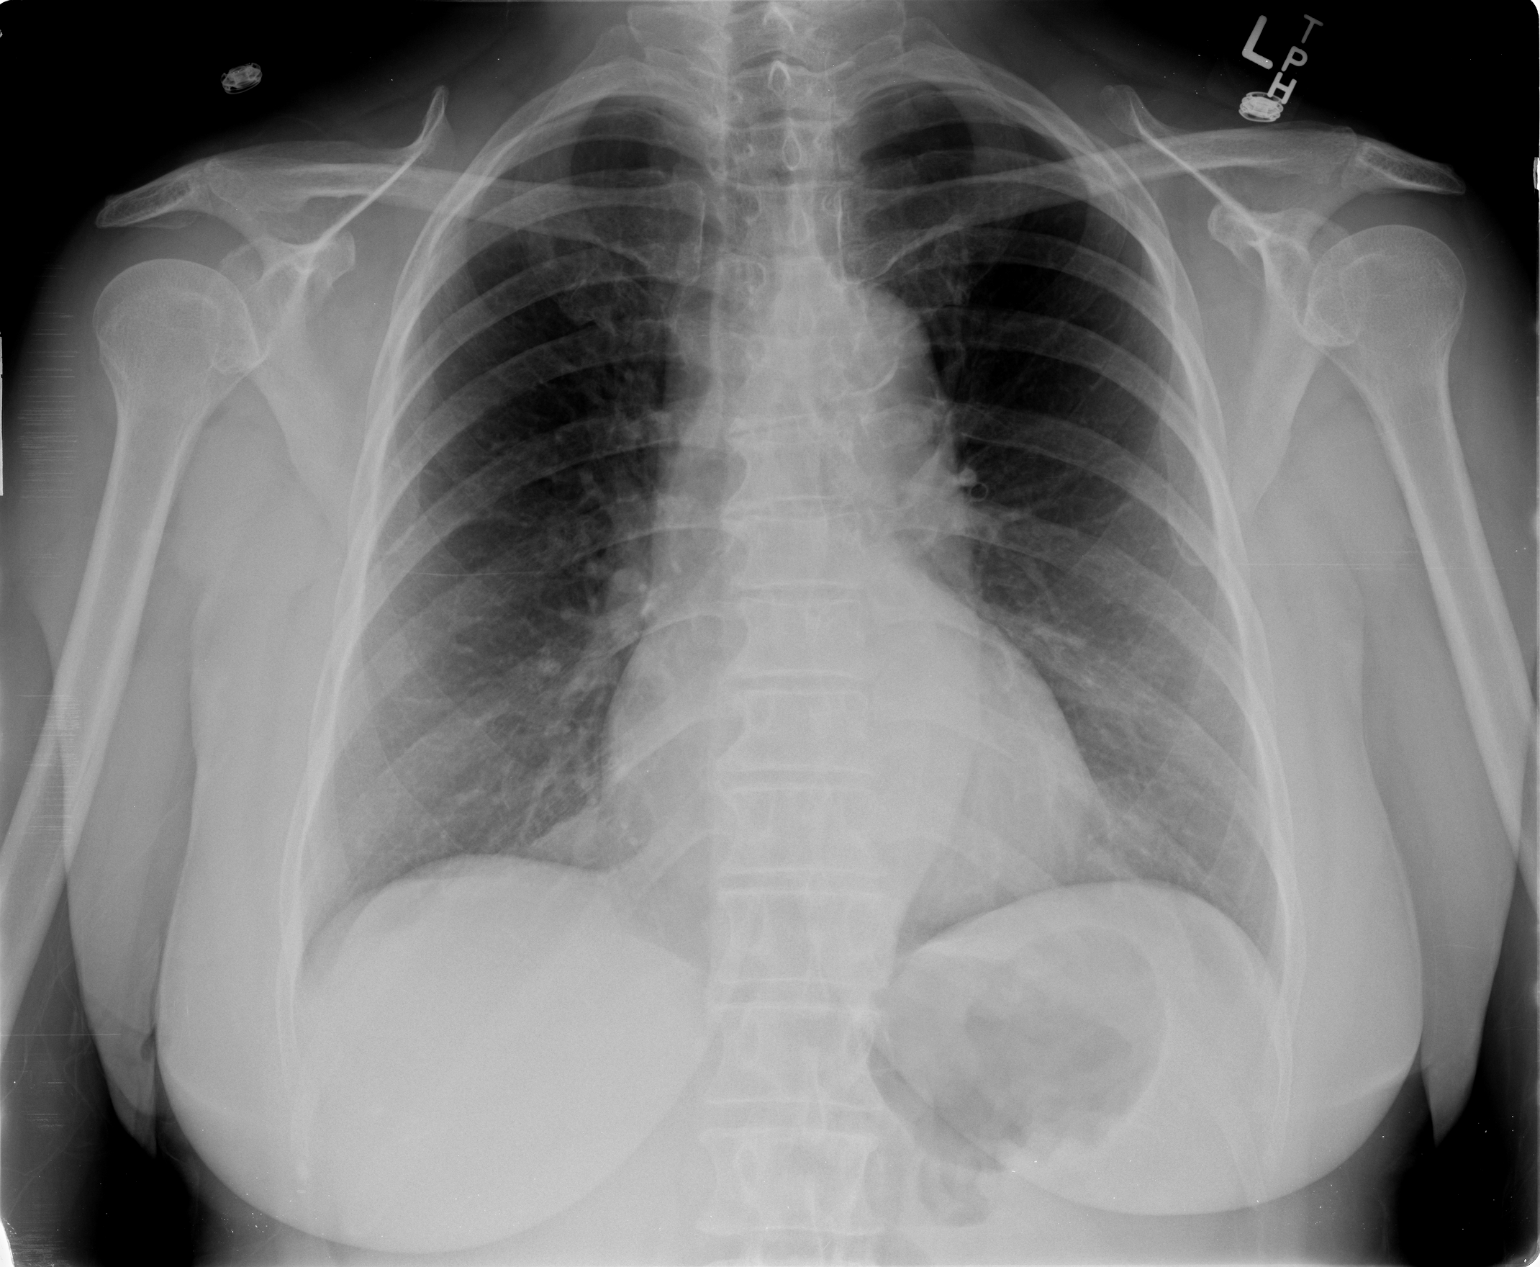

[view not recorded (2 of 2)]
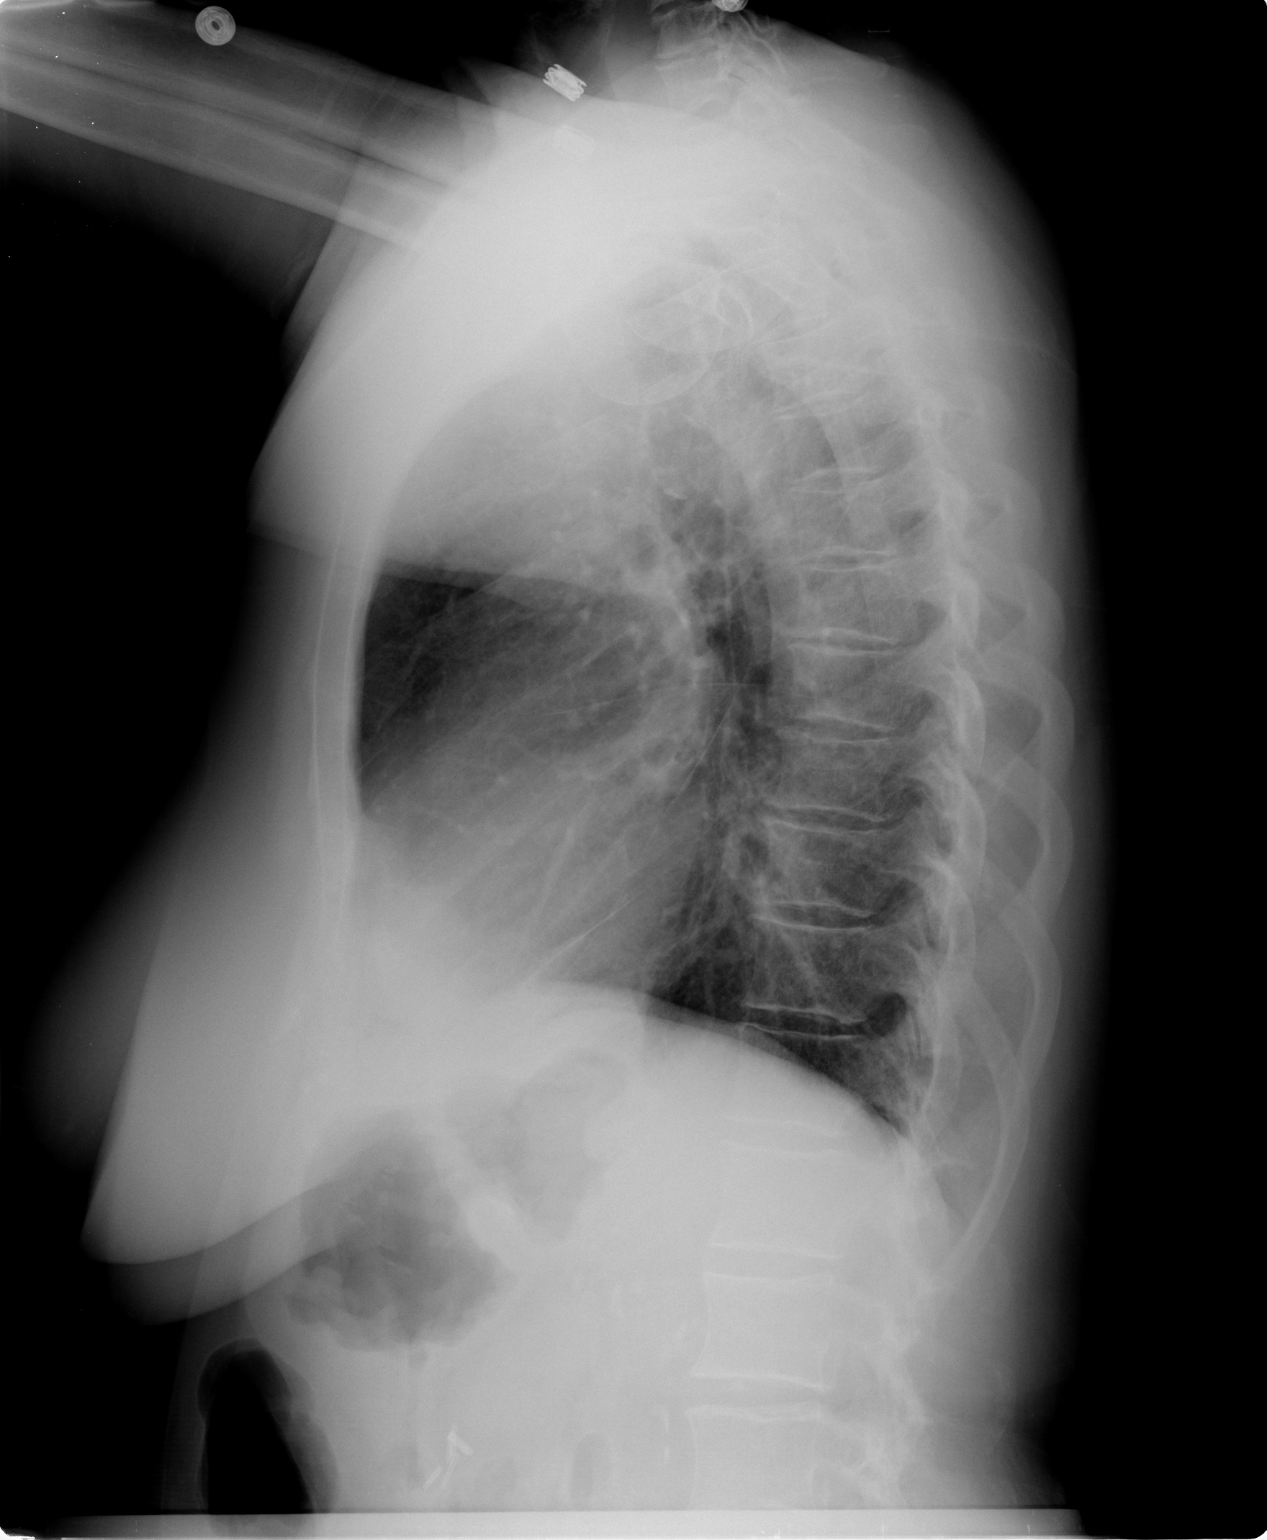

[2 of 2 positions shown; findings below may reference images not displayed]

FINDINGS: Cardiomediastinal silhouette is unremarkable.  Bony
thorax is unremarkable.  No acute infiltrate or pleural effusion.
No pulmonary edema.
IMPRESSION: No active disease.

## 2014-02-27 IMAGING — CR DG HIP 1V PORT*L*
1 series · 1 of 1 positions shown · non-contrast
Comparison: None.

CLINICAL DATA: Postop total left hip.

PORTABLE LEFT HIP - 1 VIEW

[cross table lat hip]
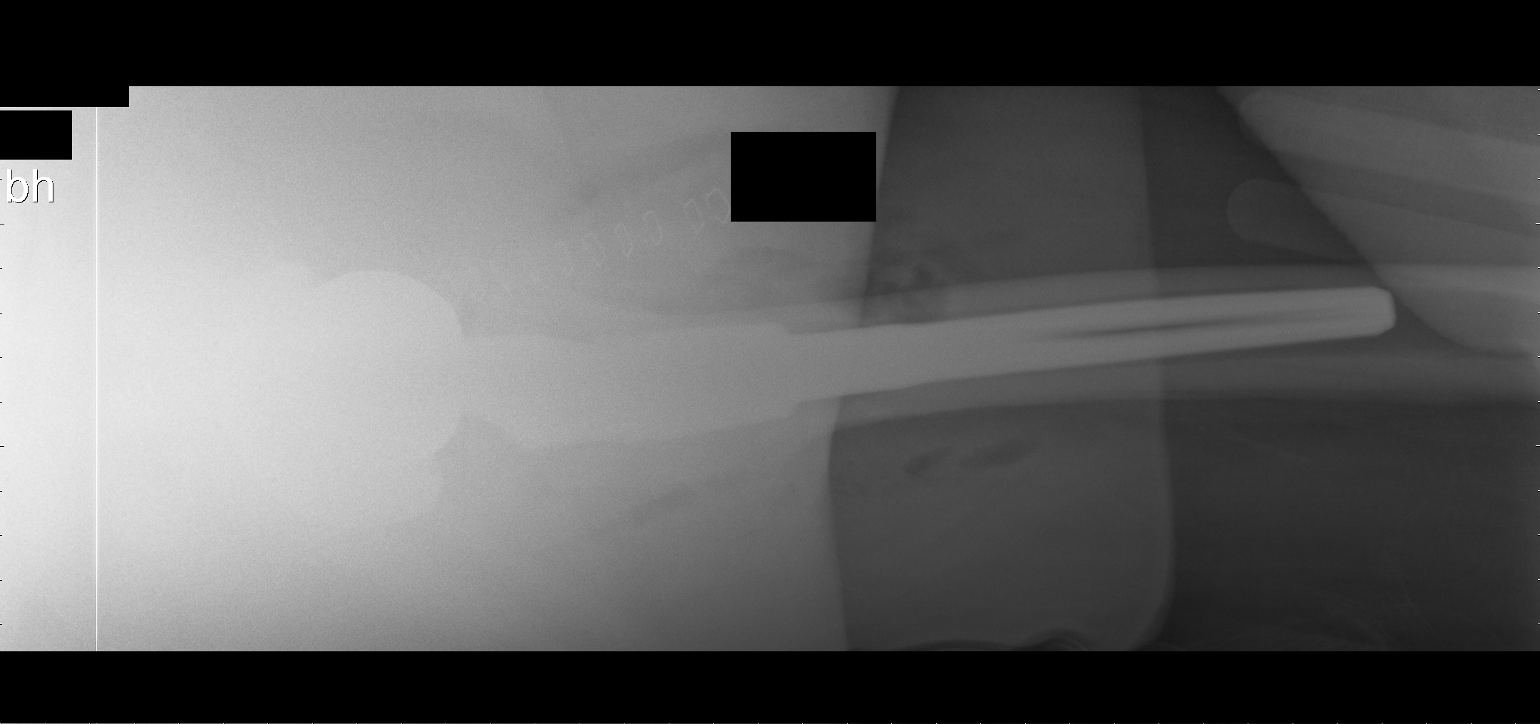

[1 of 1 positions shown; findings below may reference images not displayed]

FINDINGS: Cross-table lateral view demonstrates changes of left hip
replacement.  Normal alignment.  Soft tissue gas noted.
IMPRESSION: Left hip replacement.  No complicating feature.

## 2014-02-27 IMAGING — CR DG PORTABLE PELVIS
1 series · 1 of 1 positions shown · non-contrast
Comparison: 09/21/2010.

CLINICAL DATA: Postop left total hip.

PORTABLE PELVIS

[AP]
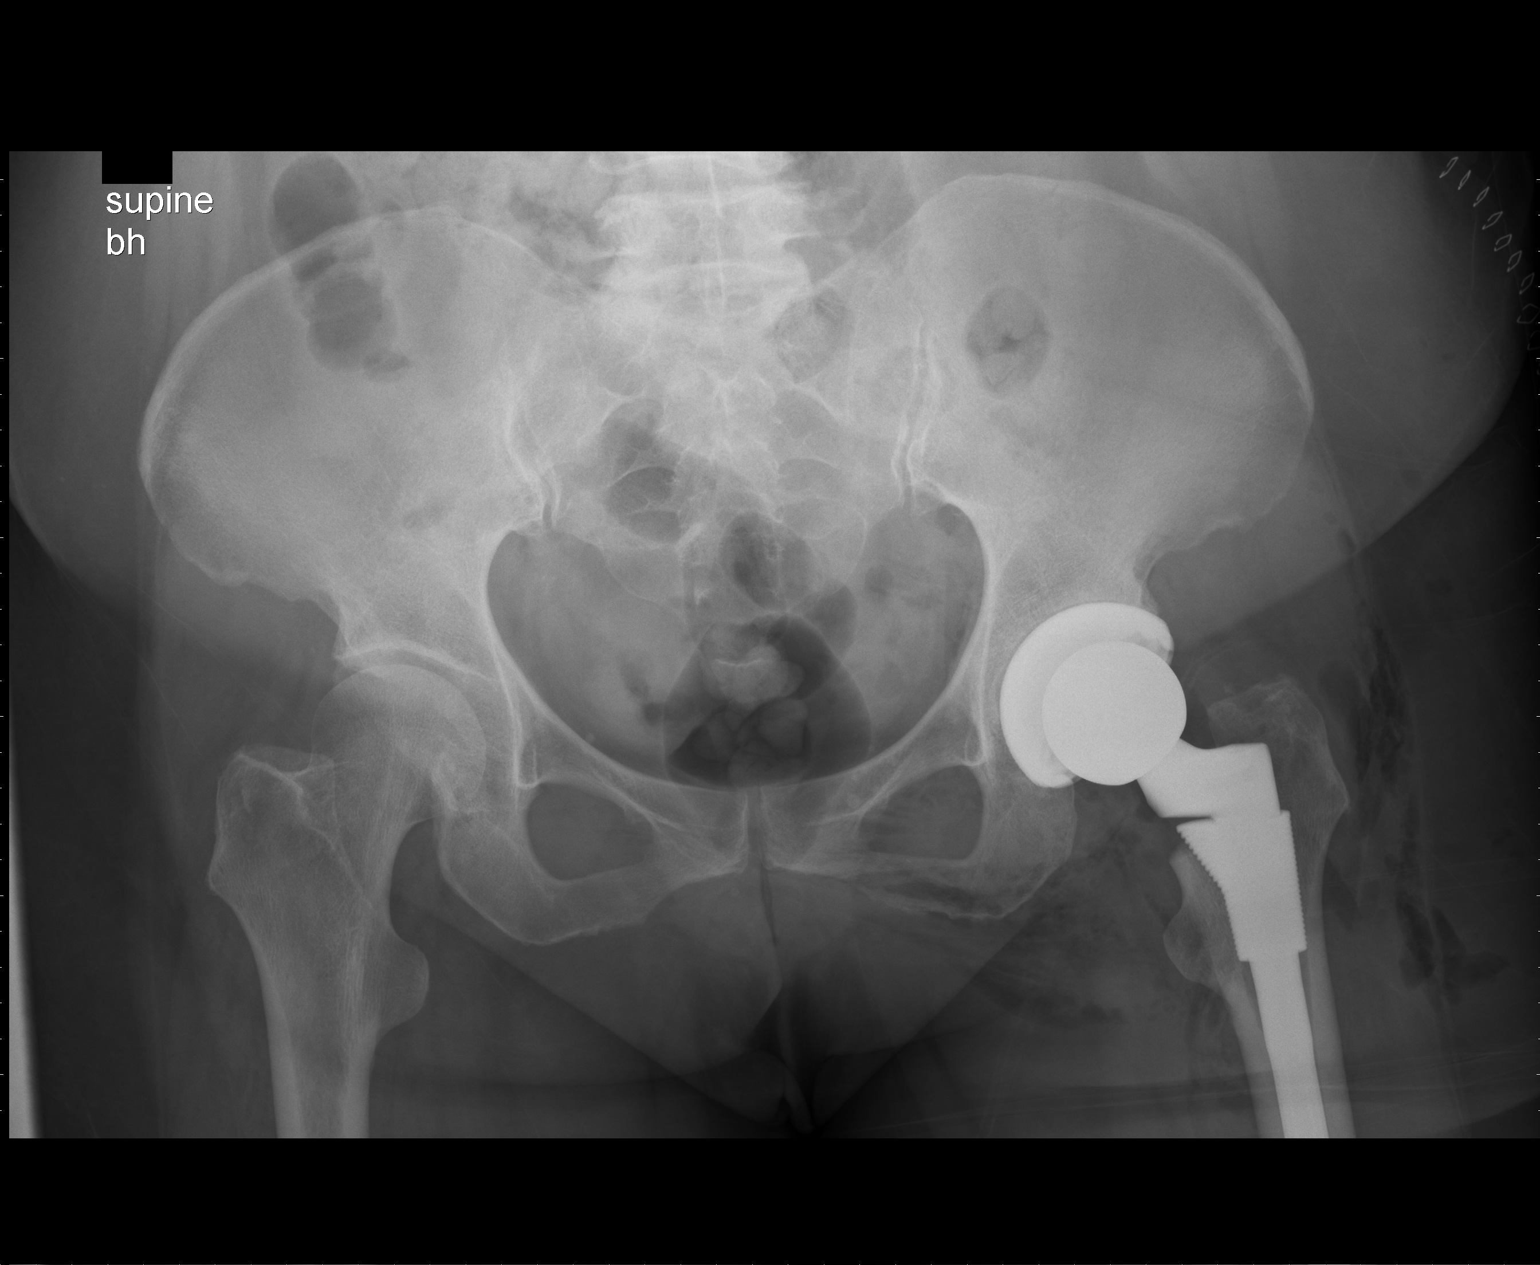

[1 of 1 positions shown; findings below may reference images not displayed]

FINDINGS: Changes of left hip replacement.  No hardware or bony
complicating feature.  Soft tissue gas noted.  No acute bony
abnormality.
IMPRESSION: Left hip replacement.  No complicating feature.

## 2014-03-18 ENCOUNTER — Other Ambulatory Visit: Payer: Self-pay | Admitting: Family Medicine

## 2014-03-24 ENCOUNTER — Encounter: Payer: Self-pay | Admitting: Family Medicine

## 2014-03-24 ENCOUNTER — Ambulatory Visit (INDEPENDENT_AMBULATORY_CARE_PROVIDER_SITE_OTHER): Payer: Medicare Other | Admitting: Family Medicine

## 2014-03-24 VITALS — BP 162/90 | HR 64 | Temp 97.9°F | Resp 18 | Wt 172.0 lb

## 2014-03-24 DIAGNOSIS — H60399 Other infective otitis externa, unspecified ear: Secondary | ICD-10-CM

## 2014-03-24 DIAGNOSIS — Z23 Encounter for immunization: Secondary | ICD-10-CM

## 2014-03-24 DIAGNOSIS — H60391 Other infective otitis externa, right ear: Secondary | ICD-10-CM

## 2014-03-24 MED ORDER — NEOMYCIN-POLYMYXIN-HC 3.5-10000-1 OT SOLN: 4.0000 [drp] | Freq: Four times a day (QID) | OTIC | Status: DC

## 2014-03-24 MED ORDER — RANITIDINE HCL 150 MG PO TABS: 150.0000 mg | ORAL_TABLET | Freq: Two times a day (BID) | ORAL | Status: DC

## 2014-03-24 NOTE — Addendum Note (Signed)
Addended by: Olena Mater on: 03/24/2014 03:43 PM   Modules accepted: Orders

## 2014-03-24 NOTE — Progress Notes (Signed)
Subjective:    Patient ID: Jackie Lynch, female    DOB: 03-17-1941, 73 y.o.   MRN: [id number]  HPI  Patient presents with one day of severe pain in her right ear. She used some of her granddaughters ear drops last night and the pain improved. It hurts her mid on the year. It also hurts for me to examine her ear. However on examination her tympanic membrane is not erythematous nor swollen. There is no effusion. However the external auditory canal is swollen and tender..  her blood pressure is also elevated today. Patient states her blood pressures typically well controlled at home at 135/80 Past Medical History  Diagnosis Date  . PONV (postoperative nausea and vomiting)   . Hyperthyroidism   . Hypertension   . GERD (gastroesophageal reflux disease)   . Arthritis   . 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 Constipation   . Diverticulosis of colon   . Renal cyst, congenital, right   . Nocturia   . Hyperlipidemia   . Anemia   . H/O osteopenia    Past Surgical History  Procedure Laterality Date  . Cholecystectomy    . Tubal ligation    . Knee arthroscopy      right and left  . Abdominal hysterectomy    . Total hip arthroplasty  11/14/2011    Procedure: TOTAL HIP ARTHROPLASTY;  Surgeon: Alta Corning, MD;  Location: [facility];  Service: Orthopedics;  Laterality: Left;  . Joint replacement    . Total knee arthroplasty Right 08/31/2012    Procedure: TOTAL KNEE ARTHROPLASTY;  Surgeon: Alta Corning, MD;  Location: [facility];  Service: Orthopedics;  Laterality: Right;   Current Outpatient Prescriptions on File Prior to Visit  Medication Sig Dispense Refill  . albuterol (PROVENTIL HFA;VENTOLIN HFA) 108 (90 BASE) MCG/ACT inhaler Inhale 2 puffs into the lungs every 6 (six) hours as needed for wheezing.  1 Inhaler  0  . amLODipine (NORVASC) 10 MG tablet Take 1 tablet by mouth  every day  90 tablet  3  . CALCIUM-VITAMIN D PO Take 1 tablet by mouth daily.      . cloNIDine  (CATAPRES) 0.1 MG tablet Take 1 tablet by mouth  twice a day  180 tablet  3  . diazepam (VALIUM) 5 MG tablet Take 1 tablet (5 mg total) by mouth every 8 (eight) hours as needed for anxiety.  30 tablet  1  . docusate sodium (COLACE) 100 MG capsule Take 100 mg by mouth daily.      . fenofibrate 160 MG tablet Take 1 tablet by mouth  every day  90 tablet  3  . hydrochlorothiazide (HYDRODIURIL) 25 MG tablet Take 1 tablet by mouth  every day  90 tablet  3  . losartan (COZAAR) 100 MG tablet Take 1 tablet by mouth  every day  90 tablet  3  . meloxicam (MOBIC) 15 MG tablet TAKE ONE TABLET BY MOUTH ONCE DAILY  30 tablet  3  . methimazole (TAPAZOLE) 10 MG tablet Take 10 mg by mouth 3 (three) times a week.       . metoprolol (LOPRESSOR) 50 MG tablet Take 1 tablet by mouth  twice a day  180 tablet  3  . omeprazole (PRILOSEC) 40 MG capsule Take 1 capsule (40 mg total) by mouth 2 (two) times daily.  180 capsule  4  . ondansetron (ZOFRAN) 4 MG tablet Take 1 tablet (4 mg total) by mouth every  6 (six) hours as needed for nausea.  40 tablet  0  . pravastatin (PRAVACHOL) 80 MG tablet Take 1 tablet by mouth  every day  90 tablet  3  . Probiotic Product (PROBIOTIC FORMULA PO) Take 1 tablet by mouth daily.      . traMADol (ULTRAM) 50 MG tablet Take 1 tablet (50 mg total) by mouth every 6 (six) hours as needed.  30 tablet  0  . oxyCODONE-acetaminophen (PERCOCET/ROXICET) 5-325 MG per tablet Take 1-2 tablets by mouth every 6 (six) hours as needed for pain.  60 tablet  0   No current facility-administered medications on file prior to visit.   Allergies  Allergen Reactions  . Bee Venom Swelling   History   Social History  . Marital Status: Married    Spouse Name: N/A    Number of Children: N/A  . Years of Education: N/A   Occupational History  . Not on file.   Social History Main Topics  . Smoking status: Never Smoker   . Smokeless tobacco: Never Used  . Alcohol Use: No  . Drug Use: No  . Sexual Activity:  No   Other Topics Concern  . Not on file   Social History Narrative  . No narrative on file     Review of Systems  All other systems reviewed and are negative.      Objective:   Physical Exam  Vitals reviewed. HENT:  Right Ear: External ear normal.  Left Ear: External ear normal.  Nose: Nose normal.  Mouth/Throat: Oropharynx is clear and moist. No oropharyngeal exudate.  Cardiovascular: Normal rate and regular rhythm.   Pulmonary/Chest: Effort normal and breath sounds normal.   both tympanic membranes are normal in appearance however the right external auditory canal is erythematous swollen and tender        Assessment & Plan:  Otitis, externa, infective, right - Plan: neomycin-polymyxin-hydrocortisone (CORTISPORIN) otic solution   Chief the patient with Cortisporin HC otic 4 drops 4 times a day for 7-10 days. Have also asked the patient check her blood pressure daily for the next 2-3 days and then with values to me. If greater than 140/90, heart increase her clonidine.

## 2014-04-11 ENCOUNTER — Telehealth: Payer: Self-pay | Admitting: *Deleted

## 2014-04-11 MED ORDER — CLONIDINE HCL 0.1 MG PO TABS: ORAL_TABLET | ORAL | Status: DC

## 2014-04-11 NOTE — Telephone Encounter (Signed)
Received BP log. Readings as follows:  03/26/2014 141/70- morning 130/75- night 03/27/2014    164/85- night 03/28/2014 131/73- morning 03/29/2014 145/76- morning 148/78- night 04/10/2014 117/71- morning  MD made aware and new orders obtained to increase Clonidine 0.1mg  to TID.   Call in (1) week with BP readings.   Return in (1) month to F/U.  Call placed to patient and patient made aware. Verbalized understanding. Appointment scheduled.

## 2014-05-13 ENCOUNTER — Ambulatory Visit (INDEPENDENT_AMBULATORY_CARE_PROVIDER_SITE_OTHER): Payer: Medicare Other | Admitting: Family Medicine

## 2014-05-13 ENCOUNTER — Encounter: Payer: Self-pay | Admitting: Family Medicine

## 2014-05-13 VITALS — BP 152/86 | HR 72 | Temp 98.2°F | Resp 18 | Ht 60.0 in | Wt 172.0 lb

## 2014-05-13 DIAGNOSIS — Z23 Encounter for immunization: Secondary | ICD-10-CM | POA: Diagnosis not present

## 2014-05-13 DIAGNOSIS — I10 Essential (primary) hypertension: Secondary | ICD-10-CM

## 2014-05-13 NOTE — Progress Notes (Signed)
Subjective:    Patient ID: Jackie Lynch, female    DOB: [birth year], 73 y.o.   MRN: [id number]  HPI Please see the patient's last office visit.  Since that time I having clonidine to 0.1 mg by mouth 3 times a day. She has been checking her blood pressure every day at home both in the morning and at night. On the higher dose of clonidine, her blood pressure has been ranging 109-129/50-80.Marland Kitchen She denies any chest pain shortness of breath or dyspnea on exertion. Her blood pressure slightly elevated today but she does have an element of whitecoat hypertension. Her home blood pressures have been excellent and she has verified that her cuff is accurate. Past Medical History  Diagnosis Date  . PONV (postoperative nausea and vomiting)   . Hyperthyroidism   . Hypertension   . GERD (gastroesophageal reflux disease)   . Arthritis   . 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 Constipation   . Diverticulosis of colon   . Renal cyst, congenital, right   . Nocturia   . Hyperlipidemia   . Anemia   . H/O osteopenia    Past Surgical History  Procedure Laterality Date  . Cholecystectomy    . Tubal ligation    . Knee arthroscopy      right and left  . Abdominal hysterectomy    . Total hip arthroplasty  11/14/2011    Procedure: TOTAL HIP ARTHROPLASTY;  Surgeon: Alta Corning, MD;  Location: [facility];  Service: Orthopedics;  Laterality: Left;  . Joint replacement    . Total knee arthroplasty Right 08/31/2012    Procedure: TOTAL KNEE ARTHROPLASTY;  Surgeon: Alta Corning, MD;  Location: [facility];  Service: Orthopedics;  Laterality: Right;   Current Outpatient Prescriptions on File Prior to Visit  Medication Sig Dispense Refill  . albuterol (PROVENTIL HFA;VENTOLIN HFA) 108 (90 BASE) MCG/ACT inhaler Inhale 2 puffs into the lungs every 6 (six) hours as needed for wheezing. 1 Inhaler 0  . amLODipine (NORVASC) 10 MG tablet Take 1 tablet by mouth  every day 90 tablet 3  . CALCIUM-VITAMIN D PO Take  1 tablet by mouth daily.    . cloNIDine (CATAPRES) 0.1 MG tablet Take 1 tablet by mouth 3x a day- elevated blood pressure 270 tablet 3  . diazepam (VALIUM) 5 MG tablet Take 1 tablet (5 mg total) by mouth every 8 (eight) hours as needed for anxiety. 30 tablet 1  . docusate sodium (COLACE) 100 MG capsule Take 100 mg by mouth daily.    . fenofibrate 160 MG tablet Take 1 tablet by mouth  every day 90 tablet 3  . hydrochlorothiazide (HYDRODIURIL) 25 MG tablet Take 1 tablet by mouth  every day 90 tablet 3  . losartan (COZAAR) 100 MG tablet Take 1 tablet by mouth  every day 90 tablet 3  . meloxicam (MOBIC) 15 MG tablet TAKE ONE TABLET BY MOUTH ONCE DAILY 30 tablet 3  . methimazole (TAPAZOLE) 10 MG tablet Take 10 mg by mouth 3 (three) times a week.     . metoprolol (LOPRESSOR) 50 MG tablet Take 1 tablet by mouth  twice a day 180 tablet 3  . neomycin-polymyxin-hydrocortisone (CORTISPORIN) otic solution Place 4 drops into the right ear 4 (four) times daily. 10 mL 0  . omeprazole (PRILOSEC) 40 MG capsule Take 1 capsule (40 mg total) by mouth 2 (two) times daily. 180 capsule 4  . ondansetron (ZOFRAN) 4 MG tablet Take 1 tablet (  4 mg total) by mouth every 6 (six) hours as needed for nausea. 40 tablet 0  . oxyCODONE-acetaminophen (PERCOCET/ROXICET) 5-325 MG per tablet Take 1-2 tablets by mouth every 6 (six) hours as needed for pain. 60 tablet 0  . pravastatin (PRAVACHOL) 80 MG tablet Take 1 tablet by mouth  every day 90 tablet 3  . Probiotic Product (PROBIOTIC FORMULA PO) Take 1 tablet by mouth daily.    . ranitidine (ZANTAC) 150 MG tablet Take 1 tablet (150 mg total) by mouth 2 (two) times daily. 180 tablet 3  . traMADol (ULTRAM) 50 MG tablet Take 1 tablet (50 mg total) by mouth every 6 (six) hours as needed. 30 tablet 0   No current facility-administered medications on file prior to visit.   Allergies  Allergen Reactions  . Bee Venom Swelling   History   Social History  . Marital Status: Married     Spouse Name: N/A    Number of Children: N/A  . Years of Education: N/A   Occupational History  . Not on file.   Social History Main Topics  . Smoking status: Never Smoker   . Smokeless tobacco: Never Used  . Alcohol Use: No  . Drug Use: No  . Sexual Activity: No   Other Topics Concern  . Not on file   Social History Narrative      Review of Systems  All other systems reviewed and are negative.      Objective:   Physical Exam  Cardiovascular: Normal rate, regular rhythm and normal heart sounds.   Pulmonary/Chest: Effort normal and breath sounds normal. No respiratory distress. She has no wheezes. She has no rales.  Musculoskeletal: She exhibits no edema.  Vitals reviewed.         Assessment & Plan:  Essential hypertension  Need for prophylactic vaccination against Streptococcus pneumoniae (pneumococcus) - Plan: Pneumococcal polysaccharide vaccine 23-valent greater than or equal to 2yo subcutaneous/IM  Continue clonidine 0.1 mg by mouth 3 times a day. Patient also received Pneumovax today in the office.

## 2014-05-21 DIAGNOSIS — L720 Epidermal cyst: Secondary | ICD-10-CM | POA: Diagnosis not present

## 2014-05-21 DIAGNOSIS — L814 Other melanin hyperpigmentation: Secondary | ICD-10-CM | POA: Diagnosis not present

## 2014-05-22 DIAGNOSIS — H04123 Dry eye syndrome of bilateral lacrimal glands: Secondary | ICD-10-CM | POA: Diagnosis not present

## 2014-05-22 DIAGNOSIS — H2513 Age-related nuclear cataract, bilateral: Secondary | ICD-10-CM | POA: Diagnosis not present

## 2014-06-03 DIAGNOSIS — M720 Palmar fascial fibromatosis [Dupuytren]: Secondary | ICD-10-CM | POA: Diagnosis not present

## 2014-06-03 DIAGNOSIS — M65332 Trigger finger, left middle finger: Secondary | ICD-10-CM | POA: Diagnosis not present

## 2014-06-03 DIAGNOSIS — M65342 Trigger finger, left ring finger: Secondary | ICD-10-CM | POA: Diagnosis not present

## 2014-06-18 DIAGNOSIS — M65332 Trigger finger, left middle finger: Secondary | ICD-10-CM | POA: Diagnosis not present

## 2014-06-18 DIAGNOSIS — M65342 Trigger finger, left ring finger: Secondary | ICD-10-CM | POA: Diagnosis not present

## 2014-06-18 DIAGNOSIS — M65312 Trigger thumb, left thumb: Secondary | ICD-10-CM | POA: Diagnosis not present

## 2014-07-08 DIAGNOSIS — Z9889 Other specified postprocedural states: Secondary | ICD-10-CM | POA: Diagnosis not present

## 2014-07-10 ENCOUNTER — Other Ambulatory Visit: Payer: Self-pay | Admitting: Family Medicine

## 2014-07-15 ENCOUNTER — Other Ambulatory Visit: Payer: Self-pay | Admitting: Family Medicine

## 2014-07-15 NOTE — Telephone Encounter (Signed)
Medication refilled per protocol. 

## 2014-07-22 DIAGNOSIS — Z9889 Other specified postprocedural states: Secondary | ICD-10-CM | POA: Diagnosis not present

## 2014-08-26 DIAGNOSIS — L02512 Cutaneous abscess of left hand: Secondary | ICD-10-CM | POA: Diagnosis not present

## 2014-09-02 DIAGNOSIS — Z9889 Other specified postprocedural states: Secondary | ICD-10-CM | POA: Diagnosis not present

## 2014-09-16 DIAGNOSIS — L02512 Cutaneous abscess of left hand: Secondary | ICD-10-CM | POA: Diagnosis not present

## 2014-09-30 DIAGNOSIS — Z9889 Other specified postprocedural states: Secondary | ICD-10-CM | POA: Diagnosis not present

## 2014-11-20 DIAGNOSIS — E0520 Thyrotoxicosis with toxic multinodular goiter without thyrotoxic crisis or storm: Secondary | ICD-10-CM | POA: Diagnosis not present

## 2014-11-25 DIAGNOSIS — E0590 Thyrotoxicosis, unspecified without thyrotoxic crisis or storm: Secondary | ICD-10-CM | POA: Diagnosis not present

## 2014-11-25 DIAGNOSIS — I10 Essential (primary) hypertension: Secondary | ICD-10-CM | POA: Diagnosis not present

## 2014-11-27 ENCOUNTER — Encounter: Payer: Self-pay | Admitting: Family Medicine

## 2014-11-27 ENCOUNTER — Ambulatory Visit (INDEPENDENT_AMBULATORY_CARE_PROVIDER_SITE_OTHER): Payer: Medicare Other | Admitting: Family Medicine

## 2014-11-27 VITALS — BP 118/70 | HR 72 | Temp 98.4°F | Resp 18 | Ht 60.0 in | Wt 175.0 lb

## 2014-11-27 DIAGNOSIS — R739 Hyperglycemia, unspecified: Secondary | ICD-10-CM

## 2014-11-27 DIAGNOSIS — R7309 Other abnormal glucose: Secondary | ICD-10-CM | POA: Diagnosis not present

## 2014-11-27 LAB — COMPLETE METABOLIC PANEL WITH GFR
ALK PHOS: 31 U/L — AB (ref 39–117)
ALT: 16 U/L (ref 0–35)
AST: 22 U/L (ref 0–37)
Albumin: 4.1 g/dL (ref 3.5–5.2)
BUN: 23 mg/dL (ref 6–23)
CALCIUM: 9.6 mg/dL (ref 8.4–10.5)
CHLORIDE: 104 meq/L (ref 96–112)
CO2: 24 mEq/L (ref 19–32)
CREATININE: 0.9 mg/dL (ref 0.50–1.10)
GFR, EST AFRICAN AMERICAN: 74 mL/min
GFR, Est Non African American: 64 mL/min
GLUCOSE: 117 mg/dL — AB (ref 70–99)
POTASSIUM: 3.8 meq/L (ref 3.5–5.3)
Sodium: 141 mEq/L (ref 135–145)
Total Bilirubin: 0.5 mg/dL (ref 0.2–1.2)
Total Protein: 6.9 g/dL (ref 6.0–8.3)

## 2014-11-27 LAB — HEMOGLOBIN A1C
Hgb A1c MFr Bld: 6.5 % — ABNORMAL HIGH (ref <5.7)
Mean Plasma Glucose: 140 mg/dL — ABNORMAL HIGH (ref <117)

## 2014-11-27 MED ORDER — DIAZEPAM 5 MG PO TABS: 5.0000 mg | ORAL_TABLET | Freq: Three times a day (TID) | ORAL | Status: DC | PRN

## 2014-11-27 NOTE — Progress Notes (Signed)
Subjective:    Patient ID: [name], female    DOB: [birth year], 74 y.o.   MRN: [id number]  HPI Patient has gained 3 pounds since last saw her last. She also has polyuria and polydipsia. She is concerned that she may have developed diabetes. She denies any blurred vision. She denies any neuropathy in the feet. Foot exam is normal today with normal pulses and normal sensation with no evidence of calluses or sores. She has checked her blood sugar randomly at home and found to be elevated at 128 although she is unsure if this was fasting or not. She is also requesting a refill on her Valium. She takes this sparingly as needed for anxiety. I gave her 30 tablets last July and she is taking only 20 of them in the last calendar year. I will gladly refill this today given the fact she uses this sparingly for anxiety.  Past Medical History  Diagnosis Date  . PONV (postoperative nausea and vomiting)   . Hyperthyroidism   . Hypertension   . GERD (gastroesophageal reflux disease)   . Arthritis   . 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 Constipation   . Diverticulosis of colon   . Renal cyst, congenital, right   . Nocturia   . Hyperlipidemia   . Anemia   . H/O osteopenia    Past Surgical History  Procedure Laterality Date  . Cholecystectomy    . Tubal ligation    . Knee arthroscopy      right and left  . Abdominal hysterectomy    . Total hip arthroplasty  11/14/2011    Procedure: TOTAL HIP ARTHROPLASTY;  Surgeon: Alta Corning, MD;  Location: [place];  Service: Orthopedics;  Laterality: Left;  . Joint replacement    . Total knee arthroplasty Right 08/31/2012    Procedure: TOTAL KNEE ARTHROPLASTY;  Surgeon: Alta Corning, MD;  Location: [place];  Service: Orthopedics;  Laterality: Right;   Current Outpatient Prescriptions on File Prior to Visit  Medication Sig Dispense Refill  . albuterol (PROVENTIL HFA;VENTOLIN HFA) 108 (90 BASE) MCG/ACT inhaler Inhale 2 puffs into the  lungs every 6 (six) hours as needed for wheezing. 1 Inhaler 0  . amLODipine (NORVASC) 10 MG tablet Take 1 tablet by mouth  every day 90 tablet 2  . CALCIUM-VITAMIN D PO Take 1 tablet by mouth daily.    . cloNIDine (CATAPRES) 0.1 MG tablet Take 1 tablet by mouth 3x a day- elevated blood pressure 270 tablet 3  . docusate sodium (COLACE) 100 MG capsule Take 100 mg by mouth daily.    . fenofibrate 160 MG tablet Take 1 tablet by mouth  every day 90 tablet 2  . hydrochlorothiazide (HYDRODIURIL) 25 MG tablet Take 1 tablet by mouth  every day 90 tablet 2  . losartan (COZAAR) 100 MG tablet Take 1 tablet by mouth  every day 90 tablet 2  . meloxicam (MOBIC) 15 MG tablet TAKE ONE TABLET BY MOUTH ONCE DAILY 30 tablet 3  . methimazole (TAPAZOLE) 10 MG tablet Take 10 mg by mouth 3 (three) times a week.     . metoprolol (LOPRESSOR) 50 MG tablet Take 1 tablet by mouth  twice a day 180 tablet 2  . neomycin-polymyxin-hydrocortisone (CORTISPORIN) otic solution Place 4 drops into the right ear 4 (four) times daily. 10 mL 0  . omeprazole (PRILOSEC) 40 MG capsule Take 1 capsule (40 mg total) by mouth 2 (two) times daily. 180 capsule  4  . ondansetron (ZOFRAN) 4 MG tablet Take 1 tablet (4 mg total) by mouth every 6 (six) hours as needed for nausea. 40 tablet 0  . oxyCODONE-acetaminophen (PERCOCET/ROXICET) 5-325 MG per tablet Take 1-2 tablets by mouth every 6 (six) hours as needed for pain. 60 tablet 0  . pravastatin (PRAVACHOL) 80 MG tablet Take 1 tablet by mouth  every day 90 tablet 1  . Probiotic Product (PROBIOTIC FORMULA PO) Take 1 tablet by mouth daily.    . ranitidine (ZANTAC) 150 MG tablet Take 1 tablet (150 mg total) by mouth 2 (two) times daily. 180 tablet 3  . traMADol (ULTRAM) 50 MG tablet Take 1 tablet (50 mg total) by mouth every 6 (six) hours as needed. 30 tablet 0   No current facility-administered medications on file prior to visit.   Allergies  Allergen Reactions  . Bee Venom Swelling   History    Social History  . Marital Status: Married    Spouse Name: N/A  . Number of Children: N/A  . Years of Education: N/A   Occupational History  . Not on file.   Social History Main Topics  . Smoking status: Never Smoker   . Smokeless tobacco: Never Used  . Alcohol Use: No  . Drug Use: No  . Sexual Activity: No   Other Topics Concern  . Not on file   Social History Narrative      Review of Systems  All other systems reviewed and are negative.      Objective:   Physical Exam  Constitutional: She is oriented to person, place, and time. She appears well-developed and well-nourished. No distress.  Neck: Neck supple. No JVD present. No thyromegaly present.  Cardiovascular: Normal rate, regular rhythm and normal heart sounds.   No murmur heard. Pulmonary/Chest: Effort normal and breath sounds normal. No respiratory distress. She has no wheezes. She has no rales.  Abdominal: Soft. Bowel sounds are normal. She exhibits no distension. There is no tenderness. There is no rebound and no guarding.  Musculoskeletal: She exhibits no edema.  Lymphadenopathy:    She has no cervical adenopathy.  Neurological: She is alert and oriented to person, place, and time. She has normal reflexes. She displays normal reflexes. No cranial nerve deficit. She exhibits normal muscle tone. Coordination normal.  Skin: She is not diaphoretic.  Vitals reviewed.         Assessment & Plan:  Hyperglycemia - Plan: COMPLETE METABOLIC PANEL WITH GFR, Hemoglobin A1c  To fully evaluate the patient I will check a CMP as well as a hemoglobin A1c. If the patient's hemoglobin A1c is greater than 6, she will qualify for prediabetes. If it is greater than 6.5 she will qualify is diabetic. We will determine if there is any treatment necessary based upon her lab values. I will refill her Valium that she uses sparingly for anxiety

## 2014-11-28 ENCOUNTER — Encounter: Payer: Self-pay | Admitting: Family Medicine

## 2014-12-04 ENCOUNTER — Telehealth: Payer: Self-pay | Admitting: Family Medicine

## 2014-12-04 IMAGING — CR DG CHEST 2V
2 series · 2 of 2 positions shown · non-contrast
Comparison: Chest x-ray of 11/10/2011

CLINICAL DATA: Preop for knee replacement

CHEST - 2 VIEW

[view not recorded (1 of 2)]
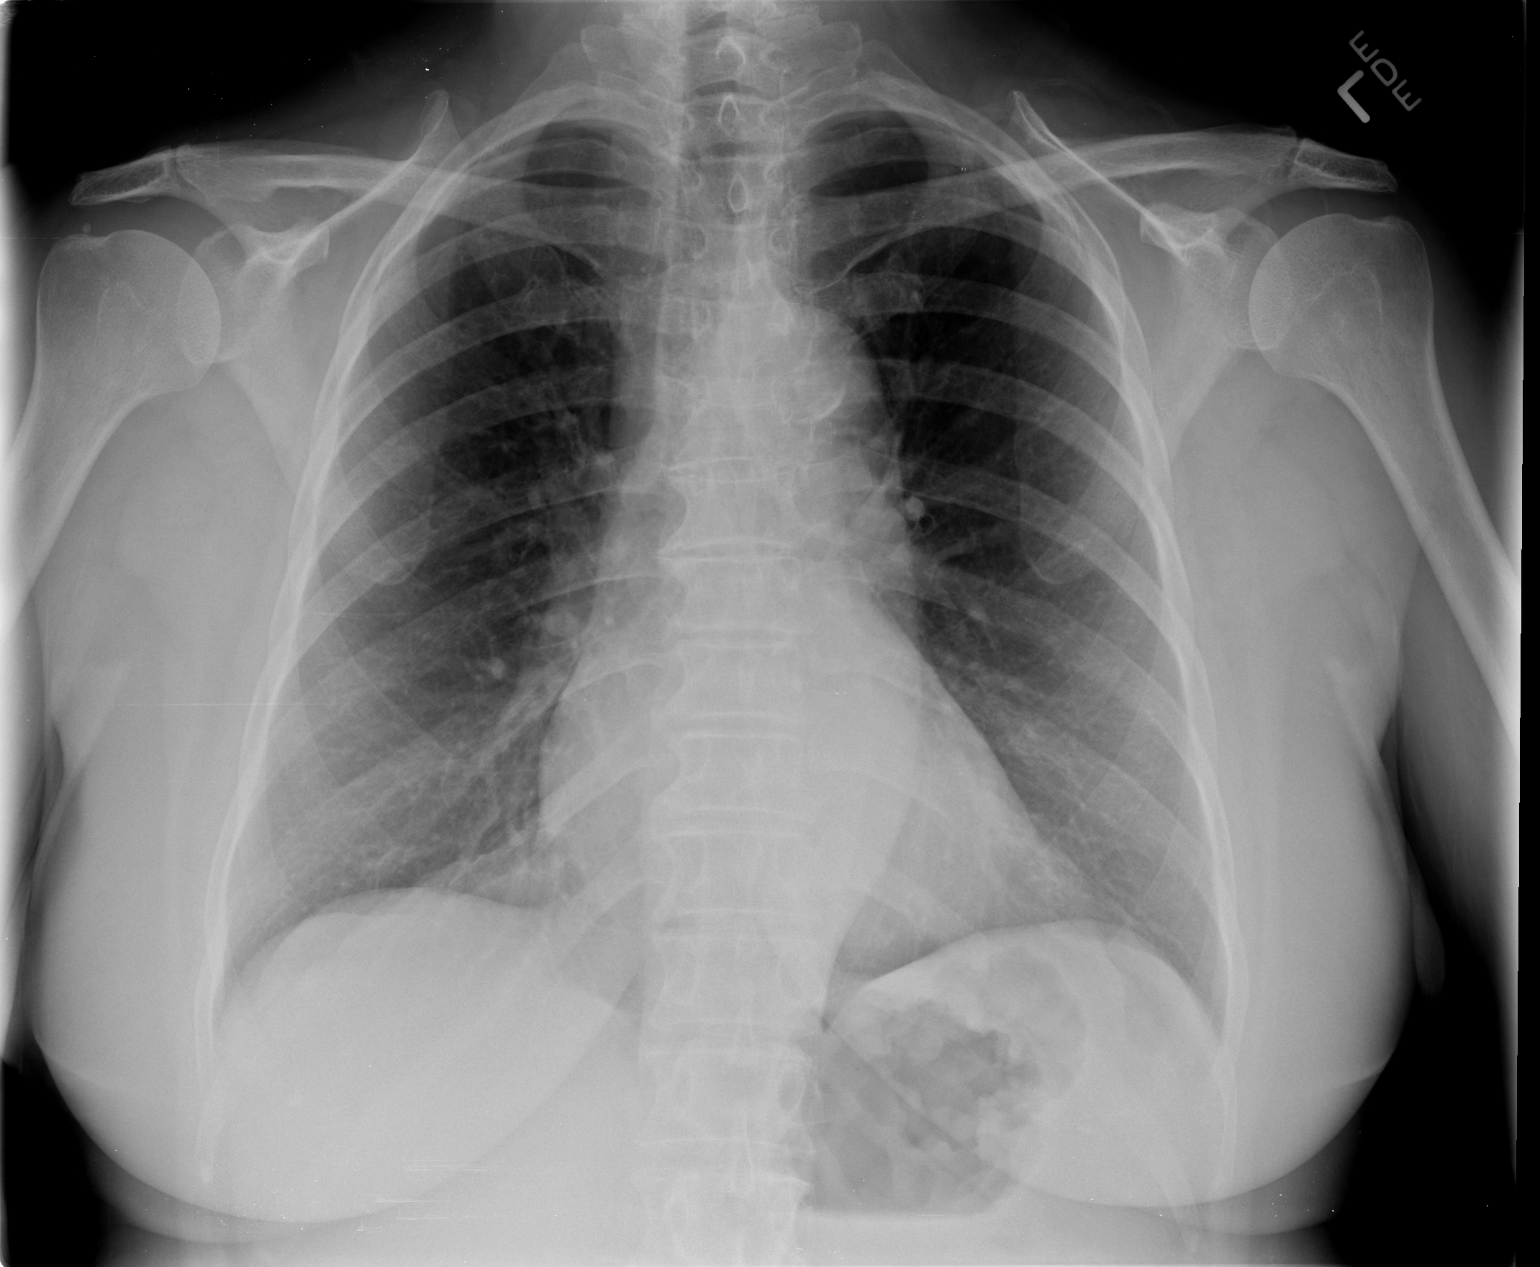

[view not recorded (2 of 2)]
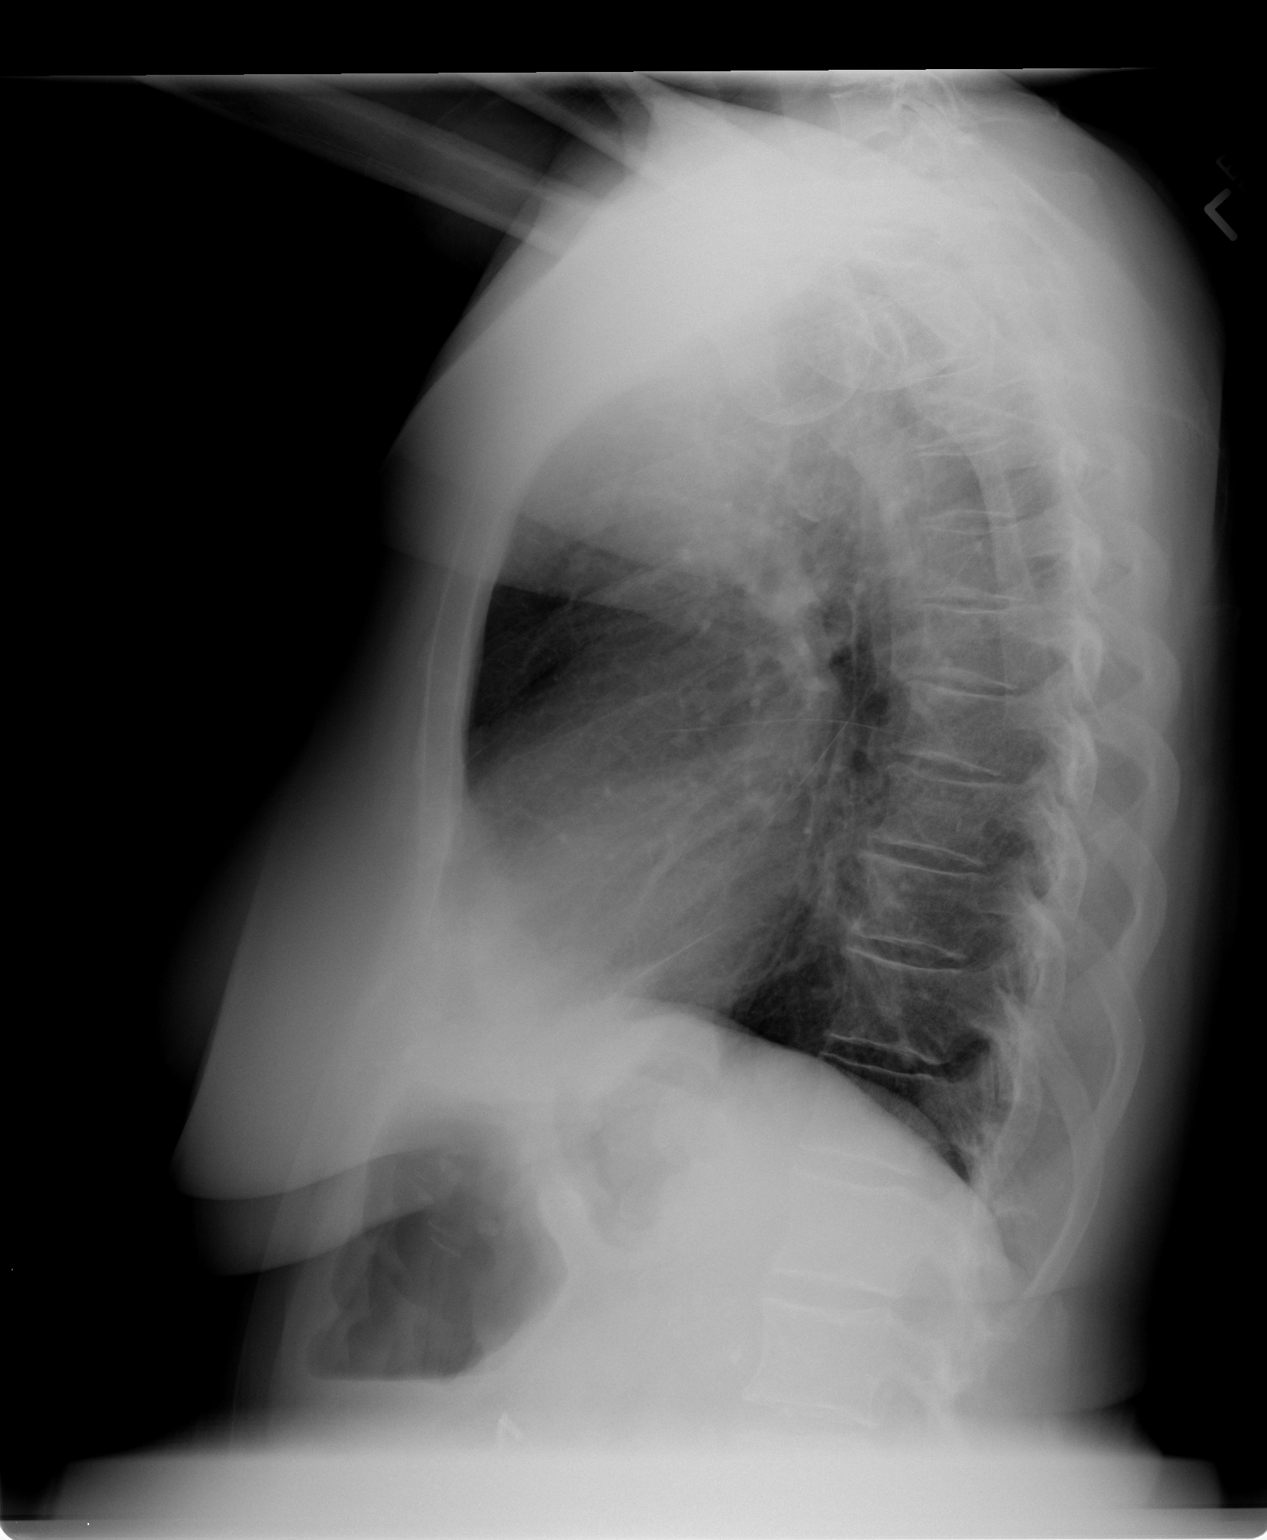

[2 of 2 positions shown; findings below may reference images not displayed]

FINDINGS: No active infiltrate or effusion is seen.  Mediastinal
contours appear normal.  The heart is mildly enlarged and stable.
No acute skeletal abnormality is noted.
IMPRESSION: Stable chest x-ray.  No active lung disease.

## 2014-12-04 NOTE — Telephone Encounter (Signed)
Patient aware of results.

## 2014-12-04 NOTE — Telephone Encounter (Signed)
Patient calling for test results [phone]

## 2015-01-20 DIAGNOSIS — Z1231 Encounter for screening mammogram for malignant neoplasm of breast: Secondary | ICD-10-CM | POA: Diagnosis not present

## 2015-01-30 ENCOUNTER — Encounter: Payer: Self-pay | Admitting: Family Medicine

## 2015-02-03 ENCOUNTER — Other Ambulatory Visit: Payer: Self-pay | Admitting: Family Medicine

## 2015-02-03 NOTE — Telephone Encounter (Signed)
Refill appropriate and filled per protocol. 

## 2015-05-05 ENCOUNTER — Ambulatory Visit (INDEPENDENT_AMBULATORY_CARE_PROVIDER_SITE_OTHER): Payer: Medicare Other | Admitting: Family Medicine

## 2015-05-05 ENCOUNTER — Other Ambulatory Visit: Payer: Medicare Other

## 2015-05-05 DIAGNOSIS — E785 Hyperlipidemia, unspecified: Secondary | ICD-10-CM | POA: Diagnosis not present

## 2015-05-05 DIAGNOSIS — Z23 Encounter for immunization: Secondary | ICD-10-CM | POA: Diagnosis not present

## 2015-05-05 DIAGNOSIS — R739 Hyperglycemia, unspecified: Secondary | ICD-10-CM

## 2015-05-05 DIAGNOSIS — I10 Essential (primary) hypertension: Secondary | ICD-10-CM | POA: Diagnosis not present

## 2015-05-05 DIAGNOSIS — Z79899 Other long term (current) drug therapy: Secondary | ICD-10-CM

## 2015-05-05 DIAGNOSIS — E039 Hypothyroidism, unspecified: Secondary | ICD-10-CM

## 2015-05-05 DIAGNOSIS — R7309 Other abnormal glucose: Secondary | ICD-10-CM | POA: Diagnosis not present

## 2015-05-05 LAB — LIPID PANEL
CHOLESTEROL: 155 mg/dL (ref 125–200)
HDL: 45 mg/dL — AB (ref >=46)
LDL Cholesterol: 97 mg/dL (ref <130)
TRIGLYCERIDES: 67 mg/dL (ref <150)
Total CHOL/HDL Ratio: 3.4 Ratio (ref <=5.0)
VLDL: 13 mg/dL (ref <30)

## 2015-05-05 LAB — COMPLETE METABOLIC PANEL WITH GFR
ALT: 13 U/L (ref 6–29)
AST: 21 U/L (ref 10–35)
Albumin: 4.1 g/dL (ref 3.6–5.1)
Alkaline Phosphatase: 35 U/L (ref 33–130)
BUN: 24 mg/dL (ref 7–25)
CO2: 29 mmol/L (ref 20–31)
CREATININE: 0.96 mg/dL — AB (ref 0.60–0.93)
Calcium: 9.5 mg/dL (ref 8.6–10.4)
Chloride: 103 mmol/L (ref 98–110)
GFR, EST NON AFRICAN AMERICAN: 59 mL/min — AB (ref >=60)
GFR, Est African American: 68 mL/min (ref >=60)
GLUCOSE: 116 mg/dL — AB (ref 70–99)
Potassium: 3.8 mmol/L (ref 3.5–5.3)
SODIUM: 140 mmol/L (ref 135–146)
TOTAL PROTEIN: 6.8 g/dL (ref 6.1–8.1)
Total Bilirubin: 0.4 mg/dL (ref 0.2–1.2)

## 2015-05-05 LAB — CBC WITH DIFFERENTIAL/PLATELET
Basophils Absolute: 0.1 10*3/uL (ref 0.0–0.1)
Basophils Relative: 1 % (ref 0–1)
Eosinophils Absolute: 0.3 10*3/uL (ref 0.0–0.7)
Eosinophils Relative: 5 % (ref 0–5)
HEMATOCRIT: 37.1 % (ref 36.0–46.0)
HEMOGLOBIN: 12.4 g/dL (ref 12.0–15.0)
LYMPHS ABS: 1.8 10*3/uL (ref 0.7–4.0)
LYMPHS PCT: 29 % (ref 12–46)
MCH: 29.5 pg (ref 26.0–34.0)
MCHC: 33.4 g/dL (ref 30.0–36.0)
MCV: 88.1 fL (ref 78.0–100.0)
MONO ABS: 0.7 10*3/uL (ref 0.1–1.0)
MONOS PCT: 12 % (ref 3–12)
MPV: 12.6 fL — ABNORMAL HIGH (ref 8.6–12.4)
NEUTROS ABS: 3.3 10*3/uL (ref 1.7–7.7)
Neutrophils Relative %: 53 % (ref 43–77)
Platelets: 202 10*3/uL (ref 150–400)
RBC: 4.21 MIL/uL (ref 3.87–5.11)
RDW: 14.2 % (ref 11.5–15.5)
WBC: 6.2 10*3/uL (ref 4.0–10.5)

## 2015-05-05 LAB — TSH: TSH: 0.731 u[IU]/mL (ref 0.350–4.500)

## 2015-05-05 LAB — HEMOGLOBIN A1C
Hgb A1c MFr Bld: 6.7 % — ABNORMAL HIGH (ref <5.7)
MEAN PLASMA GLUCOSE: 146 mg/dL — AB (ref <117)

## 2015-05-08 ENCOUNTER — Other Ambulatory Visit: Payer: Self-pay | Admitting: Family Medicine

## 2015-05-08 NOTE — Telephone Encounter (Signed)
Medication refilled per protocol. 

## 2015-05-12 ENCOUNTER — Ambulatory Visit (INDEPENDENT_AMBULATORY_CARE_PROVIDER_SITE_OTHER): Payer: Medicare Other | Admitting: Family Medicine

## 2015-05-12 ENCOUNTER — Encounter: Payer: Self-pay | Admitting: Family Medicine

## 2015-05-12 VITALS — BP 142/92 | HR 72 | Temp 97.7°F | Resp 18 | Wt 170.0 lb

## 2015-05-12 DIAGNOSIS — E785 Hyperlipidemia, unspecified: Secondary | ICD-10-CM

## 2015-05-12 DIAGNOSIS — I10 Essential (primary) hypertension: Secondary | ICD-10-CM | POA: Diagnosis not present

## 2015-05-12 DIAGNOSIS — S60551A Superficial foreign body of right hand, initial encounter: Secondary | ICD-10-CM

## 2015-05-12 DIAGNOSIS — E0590 Thyrotoxicosis, unspecified without thyrotoxic crisis or storm: Secondary | ICD-10-CM | POA: Diagnosis not present

## 2015-05-12 DIAGNOSIS — E119 Type 2 diabetes mellitus without complications: Secondary | ICD-10-CM

## 2015-05-12 NOTE — Progress Notes (Signed)
Subjective:    Patient ID: Jackie Lynch, female    DOB: [birth year], 74 y.o.   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   2 sutures were left  Apparently clipped on the surface of the skin leaving residual suture underneath the skin. The patient has 2 areas of festering irritation now on the palm of her right hand. I anesthetized both areas with 0.1% lidocaine without epinephrine. I made a very small 3 mm incision with a scalpel in both areas.   In each area, I could visualize a dark black end of the suture.  In each case, I grasped the end of the suture with a pair of forceps and removed with gentle traction. Each wound was then covered with a Band-Aid and Neosporin. She is here today to discuss her recent lab work. Her hemoglobin A1c has risen from 6.5-6.7. However she admits that she was drinking Pepsi on a daily basis and eating a high carbohydrate diet. Her blood pressure today is borderline at 142/92. She is not checking her blood pressure at home and she denies any chest pain shortness of breath or dyspnea on exertion. She denies any myalgias or right upper quadrant pain on her statin medication coupled with fenofibrate. Her TSH is therapeutic on her current dose of methimazole. Lab on 05/05/2015  Component Date Value Ref Range Status  . Sodium 05/05/2015 140  135 - 146 mmol/L Final  . Potassium 05/05/2015 3.8  3.5 - 5.3 mmol/L Final  . Chloride 05/05/2015 103  98 - 110 mmol/L Final  . CO2 05/05/2015 29  20 - 31 mmol/L Final  . Glucose, Bld 05/05/2015 116* 70 - 99 mg/dL Final  . BUN 05/05/2015 24  7 - 25 mg/dL Final  . Creat 05/05/2015 0.96* 0.60 - 0.93 mg/dL Final  . Total Bilirubin 05/05/2015 0.4  0.2 - 1.2 mg/dL Final  . Alkaline Phosphatase 05/05/2015 35  33 - 130 U/L Final  . AST 05/05/2015 21  10 - 35 U/L Final  . ALT 05/05/2015 13  6 - 29 U/L Final  . Total Protein 05/05/2015 6.8  6.1 - 8.1 g/dL Final  . Albumin  05/05/2015 4.1  3.6 - 5.1 g/dL Final  . Calcium 05/05/2015 9.5  8.6 - 10.4 mg/dL Final  . GFR, Est African American 05/05/2015 68  >=60 mL/min Final  . GFR, Est Non African American 05/05/2015 59* >=60 mL/min Final   Comment:   The estimated GFR is a calculation valid for adults (>=70 years old) that uses the CKD-EPI algorithm to adjust for age and sex. It is   not to be used for children, pregnant women, hospitalized patients,    patients on dialysis, or with rapidly changing kidney function. According to the NKDEP, eGFR >89 is normal, 60-89 shows mild impairment, 30-59 shows moderate impairment, 15-29 shows severe impairment and <15 is ESRD.     Marland Kitchen Cholesterol 05/05/2015 155  125 - 200 mg/dL Final  . Triglycerides 05/05/2015 67  <150 mg/dL Final  . HDL 05/05/2015 45* >=46 mg/dL Final  . Total CHOL/HDL Ratio 05/05/2015 3.4  <=5.0 Ratio Final  . VLDL 05/05/2015 13  <30 mg/dL Final  . LDL Cholesterol 05/05/2015 97  <130 mg/dL Final   Comment:   Total Cholesterol/HDL Ratio:CHD Risk                        Coronary Heart Disease  Risk Table                                        Men       Women          1/2 Average Risk              3.4        3.3              Average Risk              5.0        4.4           2X Average Risk              9.6        7.1           3X Average Risk             23.4       11.0 Use the calculated Patient Ratio above and the CHD Risk table  to determine the patient's CHD Risk.   . WBC 05/05/2015 6.2  4.0 - 10.5 K/uL Final  . RBC 05/05/2015 4.21  3.87 - 5.11 MIL/uL Final  . Hemoglobin 05/05/2015 12.4  12.0 - 15.0 g/dL Final  . HCT 05/05/2015 37.1  36.0 - 46.0 % Final  . MCV 05/05/2015 88.1  78.0 - 100.0 fL Final  . MCH 05/05/2015 29.5  26.0 - 34.0 pg Final  . MCHC 05/05/2015 33.4  30.0 - 36.0 g/dL Final  . RDW 05/05/2015 14.2  11.5 - 15.5 % Final  . Platelets 05/05/2015 202  150 - 400 K/uL Final  . MPV 05/05/2015 12.6* 8.6 - 12.4 fL Final  . Neutrophils  Relative % 05/05/2015 53  43 - 77 % Final  . Neutro Abs 05/05/2015 3.3  1.7 - 7.7 K/uL Final  . Lymphocytes Relative 05/05/2015 29  12 - 46 % Final  . Lymphs Abs 05/05/2015 1.8  0.7 - 4.0 K/uL Final  . Monocytes Relative 05/05/2015 12  3 - 12 % Final  . Monocytes Absolute 05/05/2015 0.7  0.1 - 1.0 K/uL Final  . Eosinophils Relative 05/05/2015 5  0 - 5 % Final  . Eosinophils Absolute 05/05/2015 0.3  0.0 - 0.7 K/uL Final  . Basophils Relative 05/05/2015 1  0 - 1 % Final  . Basophils Absolute 05/05/2015 0.1  0.0 - 0.1 K/uL Final  . Smear Review 05/05/2015 Criteria for review not met   Final  . Hgb A1c MFr Bld 05/05/2015 6.7* <5.7 % Final   Comment:                                                                        According to the ADA Clinical Practice Recommendations for 2011, when HbA1c is used as a screening test:     >=6.5%   Diagnostic of Diabetes Mellitus            (if abnormal result is confirmed)   5.7-6.4%   Increased risk of developing Diabetes Mellitus   References:Diagnosis and Classification of Diabetes Mellitus,Diabetes [id number],9211,94(RDEYC  1):S62-S69 and Standards of Medical Care in         Diabetes - 2011,Diabetes DJTT,0177,93 (Suppl 1):S11-S61.     . Mean Plasma Glucose 05/05/2015 146* <117 mg/dL Final  . TSH 05/05/2015 0.731  0.350 - 4.500 uIU/mL Final   Past Medical History  Diagnosis Date  . PONV (postoperative nausea and vomiting)   . Hyperthyroidism   . Hypertension   . GERD (gastroesophageal reflux disease)   . Arthritis   . 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 Constipation   . Diverticulosis of colon   . Renal cyst, congenital, right   . Nocturia   . Hyperlipidemia   . Anemia   . H/O osteopenia    Past Surgical History  Procedure Laterality Date  . Cholecystectomy    . Tubal ligation    . Knee arthroscopy      right and left  . Abdominal hysterectomy    . Total hip arthroplasty  11/14/2011    Procedure: TOTAL HIP  ARTHROPLASTY;  Surgeon: Alta Corning, MD;  Location: [place];  Service: Orthopedics;  Laterality: Left;  . Joint replacement    . Total knee arthroplasty Right 08/31/2012    Procedure: TOTAL KNEE ARTHROPLASTY;  Surgeon: Alta Corning, MD;  Location: [place];  Service: Orthopedics;  Laterality: Right;   Current Outpatient Prescriptions on File Prior to Visit  Medication Sig Dispense Refill  . albuterol (PROVENTIL HFA;VENTOLIN HFA) 108 (90 BASE) MCG/ACT inhaler Inhale 2 puffs into the lungs every 6 (six) hours as needed for wheezing. 1 Inhaler 0  . amLODipine (NORVASC) 10 MG tablet Take 1 tablet by mouth  every day 90 tablet 1  . CALCIUM-VITAMIN D PO Take 1 tablet by mouth daily.    . cloNIDine (CATAPRES) 0.1 MG tablet Take 1 tablet by mouth 3  times a day for elevated  blood pressure 270 tablet 1  . diazepam (VALIUM) 5 MG tablet Take 1 tablet (5 mg total) by mouth every 8 (eight) hours as needed for anxiety. 30 tablet 1  . docusate sodium (COLACE) 100 MG capsule Take 100 mg by mouth daily.    . fenofibrate 160 MG tablet Take 1 tablet by mouth  every day 90 tablet 1  . hydrochlorothiazide (HYDRODIURIL) 25 MG tablet Take 1 tablet by mouth  every day 90 tablet 1  . losartan (COZAAR) 100 MG tablet Take 1 tablet by mouth  every day 90 tablet 1  . meloxicam (MOBIC) 15 MG tablet TAKE ONE TABLET BY MOUTH ONCE DAILY 30 tablet 3  . methimazole (TAPAZOLE) 10 MG tablet Take 10 mg by mouth 3 (three) times a week.     . metoprolol (LOPRESSOR) 50 MG tablet Take 1 tablet by mouth  twice a day 180 tablet 1  . omeprazole (PRILOSEC) 40 MG capsule Take 1 capsule (40 mg total) by mouth 2 (two) times daily. 180 capsule 4  . ondansetron (ZOFRAN) 4 MG tablet Take 1 tablet (4 mg total) by mouth every 6 (six) hours as needed for nausea. 40 tablet 0  . pravastatin (PRAVACHOL) 80 MG tablet Take 1 tablet by mouth  every day 90 tablet 1  . Probiotic Product (PROBIOTIC FORMULA PO) Take 1 tablet by mouth daily.    . ranitidine  (ZANTAC) 150 MG tablet Take 1 tablet (150 mg total) by mouth 2 (two) times daily. 180 tablet 3   No current facility-administered medications on file prior to visit.   Allergies  Allergen Reactions  .  Bee Venom Swelling   Social History   Social History  . Marital Status: Married    Spouse Name: N/A  . Number of Children: N/A  . Years of Education: N/A   Occupational History  . Not on file.   Social History Main Topics  . Smoking status: Never Smoker   . Smokeless tobacco: Never Used  . Alcohol Use: No  . Drug Use: No  . Sexual Activity: No   Other Topics Concern  . Not on file   Social History Narrative      Review of Systems  All other systems reviewed and are negative.      Objective:   Physical Exam  Constitutional: She appears well-developed and well-nourished.  Neck: No thyromegaly present.  Cardiovascular: Normal rate, regular rhythm, normal heart sounds and intact distal pulses.  Exam reveals no gallop and no friction rub.   No murmur heard. Pulmonary/Chest: Effort normal and breath sounds normal. No respiratory distress. She has no wheezes. She has no rales.  Abdominal: Soft. Bowel sounds are normal. . Musculoskeletal: She exhibits no edema.  Lymphadenopathy:    She has no cervical adenopathy.  Vitals reviewed.         Assessment & Plan:  HLD (hyperlipidemia)  Essential hypertension  Controlled type 2 diabetes mellitus without complication, without long-term current use of insulin ([place])  Foreign body in hand, right, initial encounter  Hyperthyroidism   Patient's LDL cholesterol is excellent and well below the goal of 100. Her triglycerides are excellent. Her diabetes test is slightly elevated. I have recommended discontinuation of her high carbohydrate diet. I recommended that she stop drinking sodas. I recommended that she decrease her consumption of bread, rice, potatoes, pasta and recheck in 3 months. Her blood pressures borderline. I've  asked the patient to check her blood pressure daily at home for the next 2 weeks and report the values to me. If necessary we may need to increase the dose of clonidine 20.2 mg 3 times a day. Patient's thyroid medication is appropriately dosed as her TSH is adequately suppressed. I'll make no changes in the dose of methimazole.   Residual suture material was removed from the 2 areas on the palm of her right hand as described in history of present illness. Each of these areas was bandaged with a Band-Aid. Wound care was discussed.

## 2015-06-05 ENCOUNTER — Ambulatory Visit: Payer: Medicare Other | Admitting: Family Medicine

## 2015-06-05 VITALS — BP 118/76 | Wt 162.0 lb

## 2015-06-05 DIAGNOSIS — I10 Essential (primary) hypertension: Secondary | ICD-10-CM

## 2015-06-22 DIAGNOSIS — H2513 Age-related nuclear cataract, bilateral: Secondary | ICD-10-CM | POA: Diagnosis not present

## 2015-06-22 DIAGNOSIS — H25013 Cortical age-related cataract, bilateral: Secondary | ICD-10-CM | POA: Diagnosis not present

## 2015-06-22 DIAGNOSIS — H25011 Cortical age-related cataract, right eye: Secondary | ICD-10-CM | POA: Diagnosis not present

## 2015-06-22 DIAGNOSIS — H2511 Age-related nuclear cataract, right eye: Secondary | ICD-10-CM | POA: Diagnosis not present

## 2015-07-21 DIAGNOSIS — D2272 Melanocytic nevi of left lower limb, including hip: Secondary | ICD-10-CM | POA: Diagnosis not present

## 2015-07-21 DIAGNOSIS — L821 Other seborrheic keratosis: Secondary | ICD-10-CM | POA: Diagnosis not present

## 2015-07-21 DIAGNOSIS — L720 Epidermal cyst: Secondary | ICD-10-CM | POA: Diagnosis not present

## 2015-07-21 DIAGNOSIS — D2262 Melanocytic nevi of left upper limb, including shoulder: Secondary | ICD-10-CM | POA: Diagnosis not present

## 2015-07-21 DIAGNOSIS — L820 Inflamed seborrheic keratosis: Secondary | ICD-10-CM | POA: Diagnosis not present

## 2015-07-21 DIAGNOSIS — L570 Actinic keratosis: Secondary | ICD-10-CM | POA: Diagnosis not present

## 2015-07-22 DIAGNOSIS — H2512 Age-related nuclear cataract, left eye: Secondary | ICD-10-CM | POA: Diagnosis not present

## 2015-07-22 DIAGNOSIS — H25012 Cortical age-related cataract, left eye: Secondary | ICD-10-CM | POA: Diagnosis not present

## 2015-07-22 DIAGNOSIS — H25011 Cortical age-related cataract, right eye: Secondary | ICD-10-CM | POA: Diagnosis not present

## 2015-07-22 DIAGNOSIS — H2511 Age-related nuclear cataract, right eye: Secondary | ICD-10-CM | POA: Diagnosis not present

## 2015-07-28 ENCOUNTER — Other Ambulatory Visit: Payer: Self-pay | Admitting: Family Medicine

## 2015-07-28 NOTE — Telephone Encounter (Signed)
Refill appropriate and filled per protocol. 

## 2015-08-05 DIAGNOSIS — H25012 Cortical age-related cataract, left eye: Secondary | ICD-10-CM | POA: Diagnosis not present

## 2015-08-05 DIAGNOSIS — H2512 Age-related nuclear cataract, left eye: Secondary | ICD-10-CM | POA: Diagnosis not present

## 2015-09-01 DIAGNOSIS — R109 Unspecified abdominal pain: Secondary | ICD-10-CM | POA: Diagnosis not present

## 2015-09-01 DIAGNOSIS — R140 Abdominal distension (gaseous): Secondary | ICD-10-CM | POA: Diagnosis not present

## 2015-09-01 DIAGNOSIS — K5730 Diverticulosis of large intestine without perforation or abscess without bleeding: Secondary | ICD-10-CM | POA: Diagnosis not present

## 2015-09-01 DIAGNOSIS — K5900 Constipation, unspecified: Secondary | ICD-10-CM | POA: Diagnosis not present

## 2015-09-07 ENCOUNTER — Ambulatory Visit: Payer: Medicare Other | Admitting: *Deleted

## 2015-09-07 VITALS — BP 118/60

## 2015-09-07 DIAGNOSIS — I10 Essential (primary) hypertension: Secondary | ICD-10-CM

## 2015-09-08 ENCOUNTER — Encounter: Payer: Self-pay | Admitting: *Deleted

## 2015-09-30 ENCOUNTER — Encounter: Payer: Self-pay | Admitting: Family Medicine

## 2015-09-30 ENCOUNTER — Ambulatory Visit (INDEPENDENT_AMBULATORY_CARE_PROVIDER_SITE_OTHER): Payer: Medicare Other | Admitting: Family Medicine

## 2015-09-30 VITALS — BP 130/78 | HR 72 | Temp 98.6°F | Resp 16 | Ht 60.0 in | Wt 161.0 lb

## 2015-09-30 DIAGNOSIS — H9202 Otalgia, left ear: Secondary | ICD-10-CM

## 2015-09-30 MED ORDER — NEOMYCIN-POLYMYXIN-HC 3.5-10000-1 OT SOLN: 3.0000 [drp] | Freq: Four times a day (QID) | OTIC | Status: DC

## 2015-09-30 NOTE — Patient Instructions (Signed)
Use the ear drop as prescribed  Call if any changes  F/U as needed

## 2015-09-30 NOTE — Progress Notes (Signed)
Patient ID: Jackie Lynch, female   DOB: [birth year], 75 y.o.   MRN: [id number]    Subjective:    Patient ID: Jackie Lynch, female    DOB: 1940-09-22, 76 y.o.   MRN: [id number]  Patient presents for L Ear Pain Patient with left ear pain for the past 3 days. She states it was a sharp pain that is a little behind her ear. She thought she felt some drainage from it but did not notice any blood. She's not had any fever no sinus drainage. She's had a mild headache on that side but nothing severe. She has not taken anything over-the-counter. She has not noted any rash.    Review Of Systems:  GEN- denies fatigue, fever, weight loss,weakness, recent illness HEENT- denies eye drainage, change in vision, nasal discharge, CVS- denies chest pain, palpitations RESP- denies SOB, cough, wheeze ABD- denies N/V, change in stools, abd pain Neuro- denies headache, dizziness, syncope, seizure activity       Objective:    BP 130/78 mmHg  Pulse 72  Temp(Src) 98.6 F (37 C) (Oral)  Resp 16  Ht 5' (1.524 m)  Wt 161 lb (73.029 kg)  BMI 31.44 kg/m2 GEN- NAD, alert and oriented x3 HEENT- PERRL, EOMI, non injected sclera, pink conjunctiva, MMM, oropharynx clear, Right canal clear mild wax, TM clear no effusion, left TM clear no effusion, mild swelling of canal, no erythema, TTP and maniupulation of pinna, small blackhead on pinna Neck- Supple, no LAD  CVS- RRR, no murmur RESP-CTAB Pulses- Radial  2+        Assessment & Plan:      Problem List Items Addressed This Visit    None    Visit Diagnoses    Otalgia, left    -  Primary    Unclear cause she may have some mild infection there is not very red, will try cortisporin drops, other differential beginnings of shingles,Not typical for trigeminal neuralgia. Butch Penny have her monitor symptoms if she improves to the eardrops and that is all we need to do. If she has any rash or changes she will call me.        Note: This dictation was prepared with  Dragon dictation along with smaller phrase technology. Any transcriptional errors that result from this process are unintentional.

## 2015-11-16 ENCOUNTER — Other Ambulatory Visit: Payer: Self-pay | Admitting: Family Medicine

## 2015-11-16 NOTE — Telephone Encounter (Signed)
Refill appropriate and filled per protocol. 

## 2015-11-19 DIAGNOSIS — E0590 Thyrotoxicosis, unspecified without thyrotoxic crisis or storm: Secondary | ICD-10-CM | POA: Diagnosis not present

## 2015-11-19 DIAGNOSIS — I10 Essential (primary) hypertension: Secondary | ICD-10-CM | POA: Diagnosis not present

## 2015-11-26 DIAGNOSIS — E0590 Thyrotoxicosis, unspecified without thyrotoxic crisis or storm: Secondary | ICD-10-CM | POA: Diagnosis not present

## 2015-11-26 DIAGNOSIS — I10 Essential (primary) hypertension: Secondary | ICD-10-CM | POA: Diagnosis not present

## 2016-02-05 DIAGNOSIS — Z780 Asymptomatic menopausal state: Secondary | ICD-10-CM | POA: Diagnosis not present

## 2016-02-05 DIAGNOSIS — Z1231 Encounter for screening mammogram for malignant neoplasm of breast: Secondary | ICD-10-CM | POA: Diagnosis not present

## 2016-02-05 DIAGNOSIS — Z803 Family history of malignant neoplasm of breast: Secondary | ICD-10-CM | POA: Diagnosis not present

## 2016-02-05 LAB — HM MAMMOGRAPHY

## 2016-02-05 LAB — HM DEXA SCAN

## 2016-02-09 ENCOUNTER — Encounter: Payer: Self-pay | Admitting: Family Medicine

## 2016-02-11 DIAGNOSIS — Z961 Presence of intraocular lens: Secondary | ICD-10-CM | POA: Diagnosis not present

## 2016-02-22 ENCOUNTER — Other Ambulatory Visit: Payer: Self-pay | Admitting: Family Medicine

## 2016-02-22 NOTE — Telephone Encounter (Signed)
Refill appropriate and filled per protocol. 

## 2016-02-26 ENCOUNTER — Encounter: Payer: Self-pay | Admitting: Family Medicine

## 2016-03-01 ENCOUNTER — Telehealth: Payer: Self-pay | Admitting: *Deleted

## 2016-03-01 NOTE — Telephone Encounter (Signed)
Received results of Dexa scan.   MD reviewed and reports no osteoporosis.   Call placed to patient and patient made aware.

## 2016-04-26 ENCOUNTER — Ambulatory Visit (INDEPENDENT_AMBULATORY_CARE_PROVIDER_SITE_OTHER): Payer: Medicare Other

## 2016-04-26 DIAGNOSIS — Z23 Encounter for immunization: Secondary | ICD-10-CM

## 2016-04-28 DIAGNOSIS — K625 Hemorrhage of anus and rectum: Secondary | ICD-10-CM | POA: Diagnosis not present

## 2016-04-28 DIAGNOSIS — K641 Second degree hemorrhoids: Secondary | ICD-10-CM | POA: Diagnosis not present

## 2016-04-28 DIAGNOSIS — K219 Gastro-esophageal reflux disease without esophagitis: Secondary | ICD-10-CM | POA: Diagnosis not present

## 2016-04-28 DIAGNOSIS — K5900 Constipation, unspecified: Secondary | ICD-10-CM | POA: Diagnosis not present

## 2016-04-28 DIAGNOSIS — K5730 Diverticulosis of large intestine without perforation or abscess without bleeding: Secondary | ICD-10-CM | POA: Diagnosis not present

## 2016-05-13 ENCOUNTER — Other Ambulatory Visit: Payer: Self-pay | Admitting: Family Medicine

## 2016-05-16 ENCOUNTER — Other Ambulatory Visit: Payer: Self-pay | Admitting: Family Medicine

## 2016-05-16 MED ORDER — CLONIDINE HCL 0.1 MG PO TABS: ORAL_TABLET | ORAL | 1 refills | Status: DC

## 2016-06-21 ENCOUNTER — Encounter: Payer: Self-pay | Admitting: Family Medicine

## 2016-06-21 ENCOUNTER — Ambulatory Visit (INDEPENDENT_AMBULATORY_CARE_PROVIDER_SITE_OTHER): Payer: Medicare Other | Admitting: Family Medicine

## 2016-06-21 VITALS — BP 132/74 | HR 78 | Temp 98.0°F | Resp 16 | Ht 60.0 in | Wt 162.0 lb

## 2016-06-21 DIAGNOSIS — I10 Essential (primary) hypertension: Secondary | ICD-10-CM

## 2016-06-21 DIAGNOSIS — M62838 Other muscle spasm: Secondary | ICD-10-CM | POA: Diagnosis not present

## 2016-06-21 DIAGNOSIS — R8290 Unspecified abnormal findings in urine: Secondary | ICD-10-CM | POA: Diagnosis not present

## 2016-06-21 LAB — URINALYSIS, ROUTINE W REFLEX MICROSCOPIC
BILIRUBIN URINE: NEGATIVE
GLUCOSE, UA: NEGATIVE
HGB URINE DIPSTICK: NEGATIVE
KETONES UR: NEGATIVE
Nitrite: POSITIVE — AB
PROTEIN: NEGATIVE
Specific Gravity, Urine: 1.015 (ref 1.001–1.035)
pH: 7.5 (ref 5.0–8.0)

## 2016-06-21 LAB — URINALYSIS, MICROSCOPIC ONLY
Casts: NONE SEEN [LPF]
Crystals: NONE SEEN [HPF]
YEAST: NONE SEEN [HPF]

## 2016-06-21 MED ORDER — CYCLOBENZAPRINE HCL 10 MG PO TABS: 10.0000 mg | ORAL_TABLET | Freq: Every day | ORAL | 0 refills | Status: DC

## 2016-06-21 MED ORDER — CIPROFLOXACIN HCL 250 MG PO TABS: 250.0000 mg | ORAL_TABLET | Freq: Two times a day (BID) | ORAL | 0 refills | Status: DC

## 2016-06-21 NOTE — Progress Notes (Signed)
Subjective:    Patient ID: Jackie Lynch, female    DOB: [birth year], 75 y.o.   MRN: [id number]  HPI Patient presents for 2 concerns. #1 she reports a 2 week history of dysuria, urgency, pelvic pressure, and increasing frequency. She also reports hesitancy. Urinalysis shows nitrites as well as leukocyte esterase. She also complains about the anterior left hip pain. This occurs every morning. Is it intense like a cramp. If she jumps out of bed and straightens her leg, the pain and spasm instantly goes away. It sounds like she is getting a cramp or spasm in her left hip flexor possibly due to the way that she sleeping on of her hip. Past Medical History:  Diagnosis Date  . Anemia   . Arthritis   . Constipation   . Diverticulosis of colon   . 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 GERD (gastroesophageal reflux disease)   . H/O osteopenia   . Hyperlipidemia   . Hypertension   . Hyperthyroidism   . Nocturia   . PONV (postoperative nausea and vomiting)   . Renal cyst, congenital, right    Past Surgical History:  Procedure Laterality Date  . ABDOMINAL HYSTERECTOMY    . CHOLECYSTECTOMY    . JOINT REPLACEMENT    . KNEE ARTHROSCOPY     right and left  . TOTAL HIP ARTHROPLASTY  11/14/2011   Procedure: TOTAL HIP ARTHROPLASTY;  Surgeon: Alta Corning, MD;  Location: [place];  Service: Orthopedics;  Laterality: Left;  . TOTAL KNEE ARTHROPLASTY Right 08/31/2012   Procedure: TOTAL KNEE ARTHROPLASTY;  Surgeon: Alta Corning, MD;  Location: [place];  Service: Orthopedics;  Laterality: Right;  . TUBAL LIGATION     Current Outpatient Prescriptions on File Prior to Visit  Medication Sig Dispense Refill  . albuterol (PROVENTIL HFA;VENTOLIN HFA) 108 (90 BASE) MCG/ACT inhaler Inhale 2 puffs into the lungs every 6 (six) hours as needed for wheezing. 1 Inhaler 0  . amLODipine (NORVASC) 10 MG tablet TAKE 1 TABLET BY MOUTH  EVERY DAY 90 tablet 3  . CALCIUM-VITAMIN D PO Take 1 tablet by mouth  daily. Reported on 09/30/2015    . cloNIDine (CATAPRES) 0.1 MG tablet Take 1 tablet by mouth 3  times a day for elevated  blood pressure 270 tablet 1  . diazepam (VALIUM) 5 MG tablet Take 1 tablet (5 mg total) by mouth every 8 (eight) hours as needed for anxiety. 30 tablet 1  . docusate sodium (COLACE) 100 MG capsule Take 100 mg by mouth daily.    . fenofibrate 160 MG tablet TAKE 1 TABLET BY MOUTH  EVERY DAY 90 tablet 3  . hydrochlorothiazide (HYDRODIURIL) 25 MG tablet TAKE 1 TABLET BY MOUTH  EVERY DAY 90 tablet 3  . losartan (COZAAR) 100 MG tablet TAKE 1 TABLET BY MOUTH  EVERY DAY 90 tablet 3  . methimazole (TAPAZOLE) 10 MG tablet Take 10 mg by mouth 3 (three) times a week.     . metoprolol (LOPRESSOR) 50 MG tablet TAKE 1 TABLET BY MOUTH  TWICE A DAY 180 tablet 3  . neomycin-polymyxin-hydrocortisone (CORTISPORIN) otic solution Place 3 drops into the left ear 4 (four) times daily. For 5 days 10 mL 0  . ondansetron (ZOFRAN) 4 MG tablet Take 1 tablet (4 mg total) by mouth every 6 (six) hours as needed for nausea. 40 tablet 0  . pravastatin (PRAVACHOL) 80 MG tablet TAKE 1 TABLET BY MOUTH  EVERY DAY 90 tablet 3  .  Probiotic Product (PROBIOTIC FORMULA PO) Take 1 tablet by mouth daily.    . ranitidine (ZANTAC) 150 MG tablet Take 1 tablet (150 mg total) by mouth 2 (two) times daily. 180 tablet 3   No current facility-administered medications on file prior to visit.    Allergies  Allergen Reactions  . Bee Venom Swelling   Social History   Social History  . Marital status: Married    Spouse name: N/A  . Number of children: N/A  . Years of education: N/A   Occupational History  . Not on file.   Social History Main Topics  . Smoking status: Never Smoker  . Smokeless tobacco: Never Used  . Alcohol use No  . Drug use: No  . Sexual activity: No   Other Topics Concern  . Not on file   Social History Narrative  . No narrative on file      Review of Systems  All other systems reviewed  and are negative.      Objective:   Physical Exam  Cardiovascular: Normal rate, regular rhythm and normal heart sounds.   Pulmonary/Chest: Breath sounds normal. No respiratory distress. She has no wheezes. She has no rales. She exhibits no tenderness.  Abdominal: Soft. Bowel sounds are normal. She exhibits no distension and no mass. There is no tenderness. There is no rebound and no guarding.  Musculoskeletal:       Left hip: She exhibits normal range of motion, normal strength, no tenderness, no bony tenderness, no crepitus and no deformity.  Vitals reviewed.         Assessment & Plan:  Abnormal urine odor - Plan: Urinalysis, Routine w reflex microscopic  Benign essential HTN - Plan: COMPLETE METABOLIC PANEL WITH GFR, Lipid panel  Muscle spasms of lower extremity, unspecified laterality  Her blood pressure today is well controlled. I will check a CMP and a fasting lipid panel to monitor her cholesterol and her renal function. Her symptoms are likely urinary tract infection. I am unable to culture the urine due to insufficient quantity but I will treat the patient empirically with Cipro 250 mg by mouth twice a day for 5 days. The pain in her anterior left hip only occurs first thing in the morning. It is intense like a cramp. It resolves immediately with position change. Therefore I believe she is having some type of spasm or cramp in her left hip flexor. I recommended trying different sleeping positions and also try Flexeril 10 mg by mouth daily at bedtime to see if this will help prevent this from occurring. Recheck in 3-4 weeks if no better

## 2016-06-22 LAB — COMPLETE METABOLIC PANEL WITH GFR
ALBUMIN: 4.3 g/dL (ref 3.6–5.1)
ALT: 18 U/L (ref 6–29)
AST: 25 U/L (ref 10–35)
Alkaline Phosphatase: 26 U/L — ABNORMAL LOW (ref 33–130)
BUN: 19 mg/dL (ref 7–25)
CHLORIDE: 103 mmol/L (ref 98–110)
CO2: 24 mmol/L (ref 20–31)
Calcium: 9.5 mg/dL (ref 8.6–10.4)
Creat: 0.94 mg/dL — ABNORMAL HIGH (ref 0.60–0.93)
GFR, EST NON AFRICAN AMERICAN: 60 mL/min (ref >=60)
GFR, Est African American: 69 mL/min (ref >=60)
GLUCOSE: 83 mg/dL (ref 70–99)
POTASSIUM: 4 mmol/L (ref 3.5–5.3)
SODIUM: 141 mmol/L (ref 135–146)
Total Bilirubin: 0.6 mg/dL (ref 0.2–1.2)
Total Protein: 7 g/dL (ref 6.1–8.1)

## 2016-06-22 LAB — LIPID PANEL
CHOL/HDL RATIO: 3.2 ratio (ref <5.0)
Cholesterol: 174 mg/dL (ref <200)
HDL: 55 mg/dL (ref >50)
LDL CALC: 105 mg/dL — AB (ref <100)
Triglycerides: 68 mg/dL (ref <150)
VLDL: 14 mg/dL (ref <30)

## 2016-06-30 DIAGNOSIS — K625 Hemorrhage of anus and rectum: Secondary | ICD-10-CM | POA: Diagnosis not present

## 2016-06-30 DIAGNOSIS — K219 Gastro-esophageal reflux disease without esophagitis: Secondary | ICD-10-CM | POA: Diagnosis not present

## 2016-06-30 DIAGNOSIS — K5731 Diverticulosis of large intestine without perforation or abscess with bleeding: Secondary | ICD-10-CM | POA: Diagnosis not present

## 2016-06-30 DIAGNOSIS — Z1211 Encounter for screening for malignant neoplasm of colon: Secondary | ICD-10-CM | POA: Diagnosis not present

## 2016-07-06 DIAGNOSIS — K5730 Diverticulosis of large intestine without perforation or abscess without bleeding: Secondary | ICD-10-CM | POA: Diagnosis not present

## 2016-07-06 DIAGNOSIS — Z1211 Encounter for screening for malignant neoplasm of colon: Secondary | ICD-10-CM | POA: Diagnosis not present

## 2016-07-06 DIAGNOSIS — K625 Hemorrhage of anus and rectum: Secondary | ICD-10-CM | POA: Diagnosis not present

## 2016-07-13 ENCOUNTER — Telehealth: Payer: Self-pay | Admitting: Family Medicine

## 2016-07-13 NOTE — Telephone Encounter (Signed)
Pt was calling about the [facility] doing labs on her and I giver her the information that we just did labs on 12/19 and if she has to pay for that then there is no reason to have redrawn. Pt verbalized understanding and will not have the labs drawn.

## 2016-07-13 NOTE — Telephone Encounter (Signed)
Patient calling to speak to you regarding [facility] screening please call her at [phone]

## 2016-07-13 NOTE — Telephone Encounter (Signed)
Patient called back would like to have [place] call her she states that [facility] is trying to sale her something she doesn't need.  CB# [phone]

## 2016-08-23 ENCOUNTER — Other Ambulatory Visit: Payer: Self-pay | Admitting: Family Medicine

## 2016-09-14 DIAGNOSIS — L570 Actinic keratosis: Secondary | ICD-10-CM | POA: Diagnosis not present

## 2016-09-14 DIAGNOSIS — L821 Other seborrheic keratosis: Secondary | ICD-10-CM | POA: Diagnosis not present

## 2016-09-14 DIAGNOSIS — L814 Other melanin hyperpigmentation: Secondary | ICD-10-CM | POA: Diagnosis not present

## 2016-09-14 DIAGNOSIS — L918 Other hypertrophic disorders of the skin: Secondary | ICD-10-CM | POA: Diagnosis not present

## 2016-09-14 DIAGNOSIS — D2272 Melanocytic nevi of left lower limb, including hip: Secondary | ICD-10-CM | POA: Diagnosis not present

## 2016-09-14 DIAGNOSIS — L720 Epidermal cyst: Secondary | ICD-10-CM | POA: Diagnosis not present

## 2016-10-17 DIAGNOSIS — M25552 Pain in left hip: Secondary | ICD-10-CM | POA: Diagnosis not present

## 2016-10-17 DIAGNOSIS — M65332 Trigger finger, left middle finger: Secondary | ICD-10-CM | POA: Diagnosis not present

## 2016-11-23 DIAGNOSIS — E0590 Thyrotoxicosis, unspecified without thyrotoxic crisis or storm: Secondary | ICD-10-CM | POA: Diagnosis not present

## 2016-12-01 DIAGNOSIS — I10 Essential (primary) hypertension: Secondary | ICD-10-CM | POA: Diagnosis not present

## 2016-12-01 DIAGNOSIS — E0590 Thyrotoxicosis, unspecified without thyrotoxic crisis or storm: Secondary | ICD-10-CM | POA: Diagnosis not present

## 2017-01-23 ENCOUNTER — Other Ambulatory Visit: Payer: Self-pay

## 2017-03-07 DIAGNOSIS — Z1231 Encounter for screening mammogram for malignant neoplasm of breast: Secondary | ICD-10-CM | POA: Diagnosis not present

## 2017-03-07 DIAGNOSIS — Z803 Family history of malignant neoplasm of breast: Secondary | ICD-10-CM | POA: Diagnosis not present

## 2017-03-07 LAB — HM MAMMOGRAPHY

## 2017-03-10 ENCOUNTER — Other Ambulatory Visit: Payer: Self-pay | Admitting: Family Medicine

## 2017-03-13 ENCOUNTER — Encounter: Payer: Self-pay | Admitting: Family Medicine

## 2017-03-16 ENCOUNTER — Ambulatory Visit (INDEPENDENT_AMBULATORY_CARE_PROVIDER_SITE_OTHER): Payer: Medicare Other | Admitting: Physician Assistant

## 2017-03-16 ENCOUNTER — Encounter: Payer: Self-pay | Admitting: Physician Assistant

## 2017-03-16 VITALS — BP 140/80 | HR 68 | Temp 98.0°F | Resp 18 | Ht 60.0 in | Wt 164.0 lb

## 2017-03-16 DIAGNOSIS — K589 Irritable bowel syndrome without diarrhea: Secondary | ICD-10-CM | POA: Insufficient documentation

## 2017-03-16 DIAGNOSIS — E0590 Thyrotoxicosis, unspecified without thyrotoxic crisis or storm: Secondary | ICD-10-CM | POA: Diagnosis not present

## 2017-03-16 DIAGNOSIS — Z0000 Encounter for general adult medical examination without abnormal findings: Secondary | ICD-10-CM | POA: Diagnosis not present

## 2017-03-16 DIAGNOSIS — Z79899 Other long term (current) drug therapy: Secondary | ICD-10-CM | POA: Diagnosis not present

## 2017-03-16 DIAGNOSIS — I10 Essential (primary) hypertension: Secondary | ICD-10-CM | POA: Insufficient documentation

## 2017-03-16 DIAGNOSIS — Z23 Encounter for immunization: Secondary | ICD-10-CM

## 2017-03-16 DIAGNOSIS — K580 Irritable bowel syndrome with diarrhea: Secondary | ICD-10-CM | POA: Diagnosis not present

## 2017-03-16 DIAGNOSIS — E119 Type 2 diabetes mellitus without complications: Secondary | ICD-10-CM | POA: Diagnosis not present

## 2017-03-16 DIAGNOSIS — E785 Hyperlipidemia, unspecified: Secondary | ICD-10-CM | POA: Insufficient documentation

## 2017-03-16 DIAGNOSIS — E559 Vitamin D deficiency, unspecified: Secondary | ICD-10-CM | POA: Diagnosis not present

## 2017-03-16 NOTE — Progress Notes (Signed)
Patient ID: MIKU UDALL MRN: [id number], DOB: 19-May-1941, 76 y.o. Date of Encounter: 03/16/2017,   Chief Complaint: Physical (CPE)  HPI: 76 y.o. y/o female  here for CPE.   She reports that she had seen me in the past-- prior to me going out on maternity leave with my first pregnancy. Says that when I went out on maternity leave, they had her see Dr. Jacelyn Grip here. Then she was scheduled with Dr. Dennard Schaumann and his continued to see him but decided that she wanted to return to seeing me. She can't believe my son is now 38 years old!! She talks about her grandchildren and how sweet they are.  Asked if she sees any specialist. She reports the following: She sees Dr. Collene Mares-- GI --Says that she has IBS and hemorrhoids.  Regarding the IBS-- she says that she "has gotten to where she is almost scared to even go to church because afraid she is going to have symptoms that are embarrassing" to her. Regarding the hemorrhoids-- says that they bleed sometimes.  She sees Dr. Bubba Camp regarding her thyroid.  Says that she last saw her this past March and is scheduled to see her again in November.  Reports that she sees Dr. Berenice Primas for orthopedics. Has had knee replacement and hip replacement.  Sees Dr. Martinique -- Dermatology every year for skin cancer screen/melanoma screen.  Reports that she does have eye exam routinely with Dr. Gershon Crane. Last eye exam was April 2018. Had cataract surgery in April 2018.  Reports that she has had a hysterectomy.Not due to cancer. Was told needs no further pap smear.  She has no specific concerns to address today.  Is scheduled for complete physical exam today.   Review of Systems: Consitutional: No fever, chills, fatigue, night sweats, lymphadenopathy. No significant/unexplained weight changes. Eyes: No visual changes, eye redness, or discharge. ENT/Mouth: No ear pain, sore throat, nasal drainage, or sinus pain. Cardiovascular: No chest pressure,heaviness, tightness  or squeezing, even with exertion. No increased shortness of breath or dyspnea on exertion.No palpitations, edema, orthopnea, PND. Respiratory: No cough, hemoptysis, SOB, or wheezing. Gastrointestinal: No anorexia, dysphagia, reflux, pain, nausea, vomiting, hematemesis, melena.--------------------See HPI regarding IBS and Hemorrhoids.-------------------------------- Breast: No mass, nodules, bulging, or retraction. No skin changes or inflammation. No nipple discharge. No lymphadenopathy. Genitourinary: No dysuria, hematuria, incontinence, vaginal discharge, pruritis, burning, abnormal bleeding, or pain. Musculoskeletal: No decreased ROM, No joint pain or swelling. No significant pain in neck, back, or extremities. Skin: No rash, pruritis, or concerning lesions. Neurological: No headache, dizziness, syncope, seizures, tremors, memory loss, coordination problems, or paresthesias. Psychological: No anxiety, depression, hallucinations, SI/HI. Endocrine: No polydipsia, polyphagia, polyuria, or known diabetes.No increased fatigue. No palpitations/rapid heart rate. No significant/unexplained weight change. All other systems were reviewed and are otherwise negative.  Past Medical History:  Diagnosis Date  . Anemia   . Arthritis   . Constipation   . Diverticulosis of colon   . 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 GERD (gastroesophageal reflux disease)   . H/O osteopenia   . Hyperlipidemia   . Hypertension   . Hyperthyroidism   . Nocturia   . PONV (postoperative nausea and vomiting)   . Renal cyst, congenital, right      Past Surgical History:  Procedure Laterality Date  . ABDOMINAL HYSTERECTOMY    . CHOLECYSTECTOMY    . JOINT REPLACEMENT    . KNEE ARTHROSCOPY     right and left  . TOTAL HIP ARTHROPLASTY  11/14/2011   Procedure: TOTAL HIP ARTHROPLASTY;  Surgeon: Alta Corning, MD;  Location: [place];  Service: Orthopedics;  Laterality: Left;  . TOTAL KNEE ARTHROPLASTY  Right 08/31/2012   Procedure: TOTAL KNEE ARTHROPLASTY;  Surgeon: Alta Corning, MD;  Location: [place];  Service: Orthopedics;  Laterality: Right;  . TUBAL LIGATION      Home Meds:  Outpatient Medications Prior to Visit  Medication Sig Dispense Refill  . albuterol (PROVENTIL HFA;VENTOLIN HFA) 108 (90 BASE) MCG/ACT inhaler Inhale 2 puffs into the lungs every 6 (six) hours as needed for wheezing. 1 Inhaler 0  . amLODipine (NORVASC) 10 MG tablet TAKE 1 TABLET BY MOUTH  EVERY DAY 90 tablet 1  . CALCIUM-VITAMIN D PO Take 1 tablet by mouth daily. Reported on 09/30/2015    . cloNIDine (CATAPRES) 0.1 MG tablet TAKE 1 TABLET BY MOUTH 3  TIMES A DAY FOR ELEVATED  BLOOD PRESSURE 270 tablet 3  . cyclobenzaprine (FLEXERIL) 10 MG tablet Take 1 tablet (10 mg total) by mouth at bedtime. 30 tablet 0  . diazepam (VALIUM) 5 MG tablet Take 1 tablet (5 mg total) by mouth every 8 (eight) hours as needed for anxiety. 30 tablet 1  . docusate sodium (COLACE) 100 MG capsule Take 100 mg by mouth daily.    . fenofibrate 160 MG tablet TAKE 1 TABLET BY MOUTH  EVERY DAY 90 tablet 1  . hydrochlorothiazide (HYDRODIURIL) 25 MG tablet TAKE 1 TABLET BY MOUTH  EVERY DAY 90 tablet 1  . losartan (COZAAR) 100 MG tablet TAKE 1 TABLET BY MOUTH  EVERY DAY 90 tablet 1  . methimazole (TAPAZOLE) 10 MG tablet Take 10 mg by mouth 3 (three) times a week.     . metoprolol tartrate (LOPRESSOR) 50 MG tablet TAKE 1 TABLET BY MOUTH  TWICE A DAY 180 tablet 1  . neomycin-polymyxin-hydrocortisone (CORTISPORIN) otic solution Place 3 drops into the left ear 4 (four) times daily. For 5 days 10 mL 0  . ondansetron (ZOFRAN) 4 MG tablet Take 1 tablet (4 mg total) by mouth every 6 (six) hours as needed for nausea. 40 tablet 0  . pravastatin (PRAVACHOL) 80 MG tablet TAKE 1 TABLET BY MOUTH  EVERY DAY 90 tablet 1  . Probiotic Product (PROBIOTIC FORMULA PO) Take 1 tablet by mouth daily.    . ranitidine (ZANTAC) 150 MG tablet Take 1 tablet (150 mg total) by mouth  2 (two) times daily. 180 tablet 3  . ciprofloxacin (CIPRO) 250 MG tablet Take 1 tablet (250 mg total) by mouth 2 (two) times daily. 10 tablet 0   No facility-administered medications prior to visit.     Allergies:  Allergies  Allergen Reactions  . Bee Venom Swelling    Social History   Social History  . Marital status: Married    Spouse name: N/A  . Number of children: N/A  . Years of education: N/A   Occupational History  . Not on file.   Social History Main Topics  . Smoking status: Never Smoker  . Smokeless tobacco: Never Used  . Alcohol use No  . Drug use: No  . Sexual activity: No   Other Topics Concern  . Not on file   Social History Narrative  . No narrative on file    Family History  Problem Relation Age of Onset  . Anesthesia problems Neg Hx     Physical Exam: Blood pressure 140/80, pulse 68, temperature 98 F (36.7 C), temperature source Oral, resp. rate  18, height 5' (1.524 m), weight 164 lb (74.4 kg)., Body mass index is 32.03 kg/m. General: Well developed, well nourished WF. Appears in no acute distress. HEENT: Normocephalic, atraumatic. Conjunctiva pink, sclera non-icteric. Pupils 2 mm constricting to 1 mm, round, regular, and equally reactive to light and accomodation. EOMI. Internal auditory canal clear. TMs with good cone of light and without pathology. Nasal mucosa pink. Nares are without discharge. No sinus tenderness. Oral mucosa pink. Neck: Supple. Trachea midline. No thyromegaly. Full ROM. No lymphadenopathy.No Carotid Bruits. Lungs: Clear to auscultation bilaterally without wheezes, rales, or rhonchi. Breathing is of normal effort and unlabored. Cardiovascular: RRR with S1 S2. No murmurs, rubs, or gallops. Distal pulses 2+ symmetrically. No carotid or abdominal bruits. Breast: Symmetrical. No masses. Nipples without discharge. Abdomen: Soft, non-tender, non-distended with normoactive bowel sounds. No hepatosplenomegaly or masses. No  rebound/guarding. No CVA tenderness. No hernias.  Genitourinary:  External genitalia without lesions. Vaginal mucosa pink.No discharge present. Bimanual exam normal. Musculoskeletal: Full range of motion and 5/5 strength throughout.  Skin: Warm and moist without erythema, ecchymosis, wounds, or rash. Neuro: A+Ox3. CN II-XII grossly intact. Moves all extremities spontaneously. Full sensation throughout. Normal gait.  Psych:  Responds to questions appropriately with a normal affect.   Assessment/Plan:  76 y.o. y/o female here for CPE   1. Medicare annual wellness visit, subsequent --See Below  2. Encounter for preventive health examination  A. Screening Labs: - CBC with Differential/Platelet - COMPLETE METABOLIC PANEL WITH GFR - Lipid panel - VITAMIN D 25 Hydroxy (Vit-D Deficiency, Fractures)  B. Pap: She has had hysterectomy. Not secondary to cancer. Also she is now age 64. No further Pap smear indicated. Performed pelvic exam today.  C. Screening Mammogram: She has mammogram at [facility]. Last was-- week before last-- says that it was about 13 days ago.  D. DEXA/BMD:  He had DEXA scan at [facility]. 02/15/2016.  E. Colorectal Cancer Screening: --Per Dr. Kara Pacer. Immunizations:  Influenza: He is agreeable to receive influenza vaccine today. Tetanus: This is not covered by Medicare so deferred today. Last TD 12/10/2001. Pneumococcal: She received Pneumovax 23---05/13/2014. Agreeable to get the Prevnar 13 today. Zostavax: Received this 08/15/2006  Need for prophylactic vaccination against Streptococcus pneumoniae (pneumococcus) and influenza - Flu Vaccine QUAD 36+ mos IM - Pneumococcal conjugate vaccine 13-valent IM    3. Essential hypertension Blood pressure is at goal. Continue current medications. Check lab to monitor. - COMPLETE METABOLIC PANEL WITH GFR   4. Controlled type 2 diabetes mellitus without complication, without long-term current use of insulin (HCC) Today will  check A1c and microalbumin and labs.  Will plan for follow-up visit 3 months or sooner if needed.  he already sees Dr. Gershon Crane for annual eye exams. Will discuss diabetic foot care at next visit. She is on ARB and statin. - Hemoglobin A1c - Microalbumin, urine   5. Hyperlipidemia, unspecified hyperlipidemia type She is on pravastatin. She is fasting. Check labs to monitor. - COMPLETE METABOLIC PANEL WITH GFR - Lipid panel   6. Hyperthyroidism Managed by Dr. Bubba Camp  7. Irritable bowel syndrome with diarrhea Managed by Dr. Collene Mares    Subjective:   Patient presents for Medicare Annual/Subsequent preventive examination.   Review Past Medical/Family/Social: All of this information is reviewed today.  Risk Factors  Current exercise habits: She does no formal exercise. She is very active around the house. Dietary issues discussed: She is compliant with diabetic and low-cholesterol diet.  Cardiac risk factors: Diabetes, hypertension, hyperlipidemia, age  Depression Screen  (Note: if answer to either of the following is "Yes", a more complete depression screening is indicated)  Over the past two weeks, have you felt down, depressed or hopeless? No Over the past two weeks, have you felt little interest or pleasure in doing things? No Have you lost interest or pleasure in daily life? No Do you often feel hopeless? No Do you cry easily over simple problems? No   Activities of Daily Living  In your present state of health, do you have any difficulty performing the following activities?:  Driving? No  Managing money? No  Feeding yourself? No  Getting from bed to chair? No  Climbing a flight of stairs? No  Preparing food and eating?: No  Bathing or showering? No  Getting dressed: No  Getting to the toilet? No  Using the toilet:No  Moving around from place to place: No  In the past year have you fallen or had a near fall?:No  Are you sexually active? No  Do you have more than one  partner? No   Hearing Difficulties: No  Do you often ask people to speak up or repeat themselves? No  Do you experience ringing or noises in your ears? No Do you have difficulty understanding soft or whispered voices? No  Do you feel that you have a problem with memory? No Do you often misplace items? No  Do you feel safe at home? Yes  Cognitive Testing  Alert? Yes Normal Appearance?Yes  Oriented to person? Yes Place? Yes  Time? Yes  Recall of three objects? Yes  Can perform simple calculations? Yes  Displays appropriate judgment?Yes  Can read the correct time from a watch face?Yes   List the Names of Other Physician/Practitioners you currently use:  This information is documented in history of present illness at the beginning of the note.  Indicate any recent Medical Services you may have received from other than [facility] providers in the past year (date may be approximate).  This information is documented above. Screening Tests / Date----This information is documented above. Colonoscopy                     Zostavax  Mammogram  Influenza Vaccine  Tetanus/tdap    Assessment:    Annual wellness medicare exam   Plan:    During the course of the visit the patient was educated and counseled about appropriate screening and preventive services including:  Screening mammography  Colorectal cancer screening  Shingles vaccine. Prescription given to that she can get the vaccine at the pharmacy or Medicare part D.  Screen + for depression. PHQ- 9 score of 12 (moderate depression). We discussed the options of counseling versus possibly a medication. I encouraged her strongly think about the counseling. She is going through some medical problems currently and her husband is as well Mrs. been very stressful for her. She says she will think about it. She does have Xanax to use as needed. Though she may benefit from an SSRI for her more depressive type symptoms but she wants to hold off at  this time.  I aksed her to please have her cardioloist send records since we have none on file.  Diet review for nutrition referral? Yes ____ Not Indicated __x__  Patient Instructions (the written plan) was given to the patient.  Medicare Attestation  I have personally reviewed:  The patient's medical and social history  Their use of alcohol, tobacco or illicit drugs  Their  current medications and supplements  The patient's functional ability including ADLs,fall risks, home safety risks, cognitive, and hearing and visual impairment  Diet and physical activities  Evidence for depression or mood disorders  The patient's weight, height, BMI, and visual acuity have been recorded in the chart. I have made referrals, counseling, and provided education to the patient based on review of the above and I have provided the patient with a written personalized care plan for preventive services.      Signed, [id number] [place], [place], [facility] 03/16/2017 8:51 AM

## 2017-03-17 ENCOUNTER — Encounter: Payer: Self-pay | Admitting: *Deleted

## 2017-03-17 ENCOUNTER — Other Ambulatory Visit: Payer: Self-pay | Admitting: *Deleted

## 2017-03-17 DIAGNOSIS — E559 Vitamin D deficiency, unspecified: Secondary | ICD-10-CM | POA: Insufficient documentation

## 2017-03-17 LAB — COMPLETE METABOLIC PANEL WITH GFR
AG RATIO: 1.8 (calc) (ref 1.0–2.5)
ALKALINE PHOSPHATASE (APISO): 25 U/L — AB (ref 33–130)
ALT: 13 U/L (ref 6–29)
AST: 17 U/L (ref 10–35)
Albumin: 4.1 g/dL (ref 3.6–5.1)
BUN/Creatinine Ratio: 20 (calc) (ref 6–22)
BUN: 20 mg/dL (ref 7–25)
CALCIUM: 9.4 mg/dL (ref 8.6–10.4)
CHLORIDE: 104 mmol/L (ref 98–110)
CO2: 25 mmol/L (ref 20–32)
Creat: 1 mg/dL — ABNORMAL HIGH (ref 0.60–0.93)
GFR, EST NON AFRICAN AMERICAN: 55 mL/min/{1.73_m2} — AB (ref >=60)
GFR, Est African American: 64 mL/min/{1.73_m2} (ref >=60)
Globulin: 2.3 g/dL (calc) (ref 1.9–3.7)
Glucose, Bld: 102 mg/dL — ABNORMAL HIGH (ref 65–99)
POTASSIUM: 4 mmol/L (ref 3.5–5.3)
SODIUM: 137 mmol/L (ref 135–146)
Total Bilirubin: 0.5 mg/dL (ref 0.2–1.2)
Total Protein: 6.4 g/dL (ref 6.1–8.1)

## 2017-03-17 LAB — CBC WITH DIFFERENTIAL/PLATELET
BASOS PCT: 1.1 %
Basophils Absolute: 62 cells/uL (ref 0–200)
EOS PCT: 5.6 %
Eosinophils Absolute: 314 cells/uL (ref 15–500)
HCT: 34.9 % — ABNORMAL LOW (ref 35.0–45.0)
Hemoglobin: 11.7 g/dL (ref 11.7–15.5)
LYMPHS ABS: 2212 {cells}/uL (ref 850–3900)
MCH: 30.3 pg (ref 27.0–33.0)
MCHC: 33.5 g/dL (ref 32.0–36.0)
MCV: 90.4 fL (ref 80.0–100.0)
MPV: 12.9 fL — ABNORMAL HIGH (ref 7.5–12.5)
Monocytes Relative: 9.9 %
NEUTROS ABS: 2458 {cells}/uL (ref 1500–7800)
Neutrophils Relative %: 43.9 %
PLATELETS: 191 10*3/uL (ref 140–400)
RBC: 3.86 10*6/uL (ref 3.80–5.10)
RDW: 12.5 % (ref 11.0–15.0)
Total Lymphocyte: 39.5 %
WBC mixed population: 554 cells/uL (ref 200–950)
WBC: 5.6 10*3/uL (ref 3.8–10.8)

## 2017-03-17 LAB — HEMOGLOBIN A1C
HEMOGLOBIN A1C: 6 %{Hb} — AB (ref <5.7)
MEAN PLASMA GLUCOSE: 126 (calc)
eAG (mmol/L): 7 (calc)

## 2017-03-17 LAB — LIPID PANEL
Cholesterol: 149 mg/dL (ref <200)
HDL: 55 mg/dL (ref >50)
LDL Cholesterol (Calc): 78 mg/dL (calc)
Non-HDL Cholesterol (Calc): 94 mg/dL (calc) (ref <130)
TRIGLYCERIDES: 82 mg/dL (ref <150)
Total CHOL/HDL Ratio: 2.7 (calc) (ref <5.0)

## 2017-03-17 LAB — VITAMIN D 25 HYDROXY (VIT D DEFICIENCY, FRACTURES): VIT D 25 HYDROXY: 21 ng/mL — AB (ref 30–100)

## 2017-03-17 LAB — MICROALBUMIN, URINE: MICROALB UR: 2.1 mg/dL

## 2017-03-17 MED ORDER — CHOLECALCIFEROL 100 MCG (4000 UT) PO CAPS: ORAL_CAPSULE | ORAL | Status: DC

## 2017-06-02 DIAGNOSIS — E0590 Thyrotoxicosis, unspecified without thyrotoxic crisis or storm: Secondary | ICD-10-CM | POA: Diagnosis not present

## 2017-06-02 DIAGNOSIS — I10 Essential (primary) hypertension: Secondary | ICD-10-CM | POA: Diagnosis not present

## 2017-06-15 ENCOUNTER — Ambulatory Visit: Payer: Medicare Other | Admitting: Physician Assistant

## 2017-06-19 ENCOUNTER — Encounter: Payer: Self-pay | Admitting: Physician Assistant

## 2017-06-19 ENCOUNTER — Ambulatory Visit (INDEPENDENT_AMBULATORY_CARE_PROVIDER_SITE_OTHER): Payer: Medicare Other | Admitting: Physician Assistant

## 2017-06-19 VITALS — BP 134/72 | HR 68 | Temp 98.0°F | Resp 16 | Ht 60.0 in | Wt 167.6 lb

## 2017-06-19 DIAGNOSIS — I10 Essential (primary) hypertension: Secondary | ICD-10-CM | POA: Diagnosis not present

## 2017-06-19 DIAGNOSIS — E785 Hyperlipidemia, unspecified: Secondary | ICD-10-CM | POA: Diagnosis not present

## 2017-06-19 DIAGNOSIS — E559 Vitamin D deficiency, unspecified: Secondary | ICD-10-CM

## 2017-06-19 DIAGNOSIS — E0590 Thyrotoxicosis, unspecified without thyrotoxic crisis or storm: Secondary | ICD-10-CM | POA: Diagnosis not present

## 2017-06-19 DIAGNOSIS — E119 Type 2 diabetes mellitus without complications: Secondary | ICD-10-CM | POA: Diagnosis not present

## 2017-06-19 NOTE — Progress Notes (Addendum)
Patient ID: Jackie Lynch MRN: [id number], DOB: 01/16/1941, 76 y.o. Date of Encounter: 06/19/2017,   Chief Complaint: Physical (CPE)  HPI: 76 y.o. y/o female      03/16/2017: here for CPE.   She reports that she had seen me in the past-- prior to me going out on maternity leave with my first pregnancy. Says that when I went out on maternity leave, they had her see Dr. Jacelyn Grip here. Then she was scheduled with Dr. Dennard Schaumann and his continued to see him but decided that she wanted to return to seeing me. She can't believe my son is now 16 years old!! She talks about her grandchildren and how sweet they are.  Asked if she sees any specialist. She reports the following: She sees Dr. Collene Mares-- GI --Says that she has IBS and hemorrhoids.  Regarding the IBS-- she says that she "has gotten to where she is almost scared to even go to church because afraid she is going to have symptoms that are embarrassing" to her. Regarding the hemorrhoids-- says that they bleed sometimes.  She sees Dr. Bubba Camp regarding her thyroid.  Says that she last saw her this past March and is scheduled to see her again in November.  Reports that she sees Dr. Berenice Primas for orthopedics. Has had knee replacement and hip replacement.  Sees Dr. Martinique -- Dermatology every year for skin cancer screen/melanoma screen.  Reports that she does have eye exam routinely with Dr. Gershon Crane. Last eye exam was April 2018. Had cataract surgery in April 2018.  Reports that she has had a hysterectomy.Not due to cancer. Was told needs no further pap smear.  She has no specific concerns to address today.  Is scheduled for complete physical exam today.    06/19/2017: She has no specific complaints or concerns to address today. She is taking blood pressure medications as directed.  No lightheadedness or other adverse effects. She is taking pravastatin and fenofibrate as directed.  No myalgias or other adverse effects. She continues to see  Dr. Suzette Battiest regarding her thyroid.    Addendum Added 08/24/2017: She had office visit with me regarding GI issues today.  At that visit she brought in [place] screening. Performed 08/16/2016 Carotid artery disease--left was normal.  Right was mild. Atrial fibrillation screen was normal. Abdominal aorta aneurysm screen was normal. Peripheral arterial disease screen was normal. Osteoporosis screen was normal.  She was in the low probability section. BMI was normal.    Review of Systems: Consitutional: No fever, chills, fatigue, night sweats, lymphadenopathy. No significant/unexplained weight changes. Eyes: No visual changes, eye redness, or discharge. ENT/Mouth: No ear pain, sore throat, nasal drainage, or sinus pain. Cardiovascular: No chest pressure,heaviness, tightness or squeezing, even with exertion. No increased shortness of breath or dyspnea on exertion.No palpitations, edema, orthopnea, PND. Respiratory: No cough, hemoptysis, SOB, or wheezing. Gastrointestinal: No anorexia, dysphagia, reflux, pain, nausea, vomiting, hematemesis, melena.--------------------See HPI regarding IBS and Hemorrhoids.-------------------------------- Breast: No mass, nodules, bulging, or retraction. No skin changes or inflammation. No nipple discharge. No lymphadenopathy. Genitourinary: No dysuria, hematuria, incontinence, vaginal discharge, pruritis, burning, abnormal bleeding, or pain. Musculoskeletal: No decreased ROM, No joint pain or swelling. No significant pain in neck, back, or extremities. Skin: No rash, pruritis, or concerning lesions. Neurological: No headache, dizziness, syncope, seizures, tremors, memory loss, coordination problems, or paresthesias. Psychological: No anxiety, depression, hallucinations, SI/HI. Endocrine: No polydipsia, polyphagia, polyuria, or known diabetes.No increased fatigue. No palpitations/rapid heart rate. No significant/unexplained weight change. All other systems were  reviewed and are otherwise negative.  Past Medical History:  Diagnosis Date  . Anemia   . Arthritis   . Constipation   . Diverticulosis of colon   . 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 GERD (gastroesophageal reflux disease)   . H/O osteopenia   . Hyperlipidemia   . Hypertension   . Hyperthyroidism   . Nocturia   . PONV (postoperative nausea and vomiting)   . Renal cyst, congenital, right      Past Surgical History:  Procedure Laterality Date  . ABDOMINAL HYSTERECTOMY    . CHOLECYSTECTOMY    . JOINT REPLACEMENT    . KNEE ARTHROSCOPY     right and left  . TOTAL HIP ARTHROPLASTY  11/14/2011   Procedure: TOTAL HIP ARTHROPLASTY;  Surgeon: Alta Corning, MD;  Location: [facility];  Service: Orthopedics;  Laterality: Left;  . TOTAL KNEE ARTHROPLASTY Right 08/31/2012   Procedure: TOTAL KNEE ARTHROPLASTY;  Surgeon: Alta Corning, MD;  Location: [facility];  Service: Orthopedics;  Laterality: Right;  . TUBAL LIGATION      Home Meds:  Outpatient Medications Prior to Visit  Medication Sig Dispense Refill  . albuterol (PROVENTIL HFA;VENTOLIN HFA) 108 (90 BASE) MCG/ACT inhaler Inhale 2 puffs into the lungs every 6 (six) hours as needed for wheezing. 1 Inhaler 0  . amLODipine (NORVASC) 10 MG tablet TAKE 1 TABLET BY MOUTH  EVERY DAY 90 tablet 1  . CALCIUM-VITAMIN D PO Take 1 tablet by mouth daily. Reported on 09/30/2015    . Cholecalciferol (HM VITAMIN D3) 4000 units CAPS Take 4000iu daily. Continue Calcium with Vit D. 30 capsule   . cloNIDine (CATAPRES) 0.1 MG tablet TAKE 1 TABLET BY MOUTH 3  TIMES A DAY FOR ELEVATED  BLOOD PRESSURE 270 tablet 3  . cyclobenzaprine (FLEXERIL) 10 MG tablet Take 1 tablet (10 mg total) by mouth at bedtime. 30 tablet 0  . diazepam (VALIUM) 5 MG tablet Take 1 tablet (5 mg total) by mouth every 8 (eight) hours as needed for anxiety. 30 tablet 1  . docusate sodium (COLACE) 100 MG capsule Take 100 mg by mouth daily.    . fenofibrate 160 MG tablet TAKE 1  TABLET BY MOUTH  EVERY DAY 90 tablet 1  . hydrochlorothiazide (HYDRODIURIL) 25 MG tablet TAKE 1 TABLET BY MOUTH  EVERY DAY 90 tablet 1  . losartan (COZAAR) 100 MG tablet TAKE 1 TABLET BY MOUTH  EVERY DAY 90 tablet 1  . methimazole (TAPAZOLE) 10 MG tablet Take 10 mg by mouth 3 (three) times a week.     . metoprolol tartrate (LOPRESSOR) 50 MG tablet TAKE 1 TABLET BY MOUTH  TWICE A DAY 180 tablet 1  . neomycin-polymyxin-hydrocortisone (CORTISPORIN) otic solution Place 3 drops into the left ear 4 (four) times daily. For 5 days 10 mL 0  . ondansetron (ZOFRAN) 4 MG tablet Take 1 tablet (4 mg total) by mouth every 6 (six) hours as needed for nausea. 40 tablet 0  . pravastatin (PRAVACHOL) 80 MG tablet TAKE 1 TABLET BY MOUTH  EVERY DAY 90 tablet 1  . Probiotic Product (PROBIOTIC FORMULA PO) Take 1 tablet by mouth daily.    . ranitidine (ZANTAC) 150 MG tablet Take 1 tablet (150 mg total) by mouth 2 (two) times daily. 180 tablet 3   No facility-administered medications prior to visit.     Allergies:  Allergies  Allergen Reactions  . Bee Venom Swelling    Social History   Socioeconomic History  .  Marital status: Married    Spouse name: Not on file  . Number of children: Not on file  . Years of education: Not on file  . Highest education level: Not on file  Social Needs  . Financial resource strain: Not on file  . Food insecurity - worry: Not on file  . Food insecurity - inability: Not on file  . Transportation needs - medical: Not on file  . Transportation needs - non-medical: Not on file  Occupational History  . Not on file  Tobacco Use  . Smoking status: Never Smoker  . Smokeless tobacco: Never Used  Substance and Sexual Activity  . Alcohol use: No  . Drug use: No  . Sexual activity: No  Other Topics Concern  . Not on file  Social History Narrative  . Not on file    Family History  Problem Relation Age of Onset  . Anesthesia problems Neg Hx     Physical Exam: Blood  pressure 134/72, pulse 68, temperature 98 F (36.7 C), temperature source Oral, resp. rate 16, height 5' (1.524 m), weight 76 kg (167 lb 9.6 oz), SpO2 98 %., Body mass index is 32.73 kg/m. General: Well developed, well nourished WF. Appears in no acute distress. Neck: Supple. Trachea midline. No thyromegaly. Full ROM. No lymphadenopathy.No Carotid Bruits. Lungs: Clear to auscultation bilaterally without wheezes, rales, or rhonchi. Breathing is of normal effort and unlabored. Cardiovascular: RRR with S1 S2. No murmurs, rubs, or gallops. Distal pulses 2+ symmetrically. No carotid or abdominal bruits. Abdomen: Soft, non-tender, non-distended with normoactive bowel sounds. No hepatosplenomegaly or masses. No rebound/guarding. No CVA tenderness. No hernias.  Musculoskeletal: Full range of motion and 5/5 strength throughout.  Skin: Warm and moist without erythema, ecchymosis, wounds, or rash. Neuro: A+Ox3. CN II-XII grossly intact. Moves all extremities spontaneously. Full sensation throughout. Normal gait.  Psych:  Responds to questions appropriately with a normal affect.   Assessment/Plan:  76 y.o. y/o female here for  Essential hypertension 06/19/2017: Blood pressure is at goal. Continue current medications. Check lab to monitor. - COMPLETE METABOLIC PANEL WITH GFR  Controlled type 2 diabetes mellitus without complication, without long-term current use of insulin ([place]) 06/19/2017: Today will check A1c .  If this A1C remains good again, can wait 6 months for next follow-up visit, lab.  She already sees Dr. Gershon Crane for annual eye exams.  She is on ARB and statin.   Hyperlipidemia, unspecified hyperlipidemia type 06/19/2017: She is on pravastatin and fenofibrate. FLP was good 03/2017.  Continue current medications. Check labs to monitor. - COMPLETE METABOLIC PANEL WITH GFR  Hyperthyroidism 06/19/2017: Managed by Dr. Bubba Camp  Irritable bowel syndrome with diarrhea 06/19/2017: Managed by Dr.  Collene Mares  Vitamin D deficiency At lab 03/16/17 vitamin D level was low.  Recommend that she start vitamin D 4000 units daily. At visit 06/19/17 she reports that she has not started the vitamin D yet but is going to start it now.   --------------------FOR PREVENTIVE CARE------SEE CPE NOTE 03/16/2017-------------------------------------------  Addendum Added 08/24/2017: She had office visit with me regarding GI issues today.  At that visit she brought in [place] screening. Performed 08/16/2016 Carotid artery disease--left was normal.  Right was mild. Atrial fibrillation screen was normal. Abdominal aorta aneurysm screen was normal. Peripheral arterial disease screen was normal. Osteoporosis screen was normal.  She was in the low probability section. BMI was normal.    Signed, [address] [place], [place], [facility] 06/19/2017 4:47 PM

## 2017-06-20 LAB — COMPLETE METABOLIC PANEL WITH GFR
AG RATIO: 1.8 (calc) (ref 1.0–2.5)
ALBUMIN MSPROF: 4.4 g/dL (ref 3.6–5.1)
ALT: 15 U/L (ref 6–29)
AST: 21 U/L (ref 10–35)
Alkaline phosphatase (APISO): 28 U/L — ABNORMAL LOW (ref 33–130)
BILIRUBIN TOTAL: 0.5 mg/dL (ref 0.2–1.2)
BUN / CREAT RATIO: 25 (calc) — AB (ref 6–22)
BUN: 27 mg/dL — AB (ref 7–25)
CALCIUM: 9.9 mg/dL (ref 8.6–10.4)
CHLORIDE: 99 mmol/L (ref 98–110)
CO2: 30 mmol/L (ref 20–32)
Creat: 1.1 mg/dL — ABNORMAL HIGH (ref 0.60–0.93)
GFR, EST AFRICAN AMERICAN: 57 mL/min/{1.73_m2} — AB (ref >=60)
GFR, EST NON AFRICAN AMERICAN: 49 mL/min/{1.73_m2} — AB (ref >=60)
Globulin: 2.5 g/dL (calc) (ref 1.9–3.7)
Glucose, Bld: 117 mg/dL — ABNORMAL HIGH (ref 65–99)
POTASSIUM: 3.2 mmol/L — AB (ref 3.5–5.3)
Sodium: 136 mmol/L (ref 135–146)
TOTAL PROTEIN: 6.9 g/dL (ref 6.1–8.1)

## 2017-06-20 LAB — HEMOGLOBIN A1C
HEMOGLOBIN A1C: 6.2 %{Hb} — AB (ref <5.7)
Mean Plasma Glucose: 131 (calc)
eAG (mmol/L): 7.3 (calc)

## 2017-06-28 ENCOUNTER — Other Ambulatory Visit: Payer: Self-pay | Admitting: Family Medicine

## 2017-08-24 ENCOUNTER — Other Ambulatory Visit: Payer: Self-pay

## 2017-08-24 ENCOUNTER — Ambulatory Visit (INDEPENDENT_AMBULATORY_CARE_PROVIDER_SITE_OTHER): Payer: Medicare Other | Admitting: Physician Assistant

## 2017-08-24 ENCOUNTER — Encounter: Payer: Self-pay | Admitting: Physician Assistant

## 2017-08-24 VITALS — BP 130/74 | HR 74 | Temp 97.8°F | Resp 14 | Ht 60.0 in | Wt 171.2 lb

## 2017-08-24 DIAGNOSIS — K581 Irritable bowel syndrome with constipation: Secondary | ICD-10-CM | POA: Diagnosis not present

## 2017-08-24 NOTE — Progress Notes (Signed)
Patient ID: Jackie Lynch MRN: [id number], DOB: 1941/05/11, 77 y.o. Date of Encounter: 08/24/2017, 8:43 AM    Chief Complaint:  Chief Complaint  Patient presents with  . Abdominal Pain     HPI: 77 y.o. year old female presents with above.   She reports that she has been having these symptoms and issues for multiple years.  She makes reference to calling her GI doctor's office and them telling her some recommendations.  Then makes reference to colonoscopy.  I reviewed her colonoscopy report.  This was performed by Dr. Collene Mares on 07/06/2016.  Showed a diverticulum.  Melanosis.  Hemorrhoids.  On that report the recommendations are "high fiber diet with water consumption.  Continue present medications.  Repeat colonoscopy 10 years.  Return to GI office as needed.  Today patient reports that she has not had any follow-up office visit with GI since then.  She states that "the suppository she told me to use was going to be $200 so I got an over-the-counter suppository to use instead ". I was reviewing her medication list and saw the word Colace she said that that is the one that was too expensive that she is not using" so is using an over-the counter- stool softener. When she called the GI office recently they told her to increase the over-the-counter stool softener to taking 2 in the morning and 2 in the evening so that is what she is doing.  she use the word suppository but I think she is meaning stool softener when she says that.  She also shows me gripping of her hands and says that she has to do that to strain to use the bathroom 5 or 6 times a day. She also makes statements that she thinks that they need to take the hemorrhoid out because she thinks that the stool cannot get through because of the hemorrhoid"  Says that she cannot go anywhere and stay for more than 1 hour.  "Has to decide whether to go to Sunday school or church because she cannot go to both and be there for more than an  hour" because of this abdominal pain.  Says that she gets this pain in her abdomen off and on for over a year now.  Asked more about exactly what happens when she goes to the bathroom to have bowel movement.  Says that she does this 5 or 6 times a day.  Says that she sits on the toilet for a long time and has to strain.  Says that it feels like it will not come out and when it finally comes out it hurts.  However states that the stool is not really hard "because taking 4 stool softeners ".  Says that the stool itself is soft but has a hard time getting it to come out and that it does hurt when it comes out.      Home Meds:   Outpatient Medications Prior to Visit  Medication Sig Dispense Refill  . albuterol (PROVENTIL HFA;VENTOLIN HFA) 108 (90 BASE) MCG/ACT inhaler Inhale 2 puffs into the lungs every 6 (six) hours as needed for wheezing. 1 Inhaler 0  . amLODipine (NORVASC) 10 MG tablet TAKE 1 TABLET BY MOUTH  EVERY DAY 90 tablet 1  . CALCIUM-VITAMIN D PO Take 1 tablet by mouth daily. Reported on 09/30/2015    . Cholecalciferol (HM VITAMIN D3) 4000 units CAPS Take 4000iu daily. Continue Calcium with Vit D. 30 capsule   . cloNIDine (  CATAPRES) 0.1 MG tablet TAKE 1 TABLET BY MOUTH 3  TIMES A DAY FOR ELEVATED  BLOOD PRESSURE 270 tablet 3  . cyclobenzaprine (FLEXERIL) 10 MG tablet Take 1 tablet (10 mg total) by mouth at bedtime. 30 tablet 0  . diazepam (VALIUM) 5 MG tablet Take 1 tablet (5 mg total) by mouth every 8 (eight) hours as needed for anxiety. 30 tablet 1  . docusate sodium (COLACE) 100 MG capsule Take 100 mg by mouth daily.    . fenofibrate 160 MG tablet TAKE 1 TABLET BY MOUTH  EVERY DAY 90 tablet 1  . hydrochlorothiazide (HYDRODIURIL) 25 MG tablet TAKE 1 TABLET BY MOUTH  EVERY DAY 90 tablet 1  . losartan (COZAAR) 100 MG tablet TAKE 1 TABLET BY MOUTH  EVERY DAY 90 tablet 1  . methimazole (TAPAZOLE) 10 MG tablet Take 10 mg by mouth 3 (three) times a week.     . metoprolol tartrate  (LOPRESSOR) 50 MG tablet TAKE 1 TABLET BY MOUTH  TWICE A DAY 180 tablet 1  . neomycin-polymyxin-hydrocortisone (CORTISPORIN) otic solution Place 3 drops into the left ear 4 (four) times daily. For 5 days 10 mL 0  . ondansetron (ZOFRAN) 4 MG tablet Take 1 tablet (4 mg total) by mouth every 6 (six) hours as needed for nausea. 40 tablet 0  . pravastatin (PRAVACHOL) 80 MG tablet TAKE 1 TABLET BY MOUTH  EVERY DAY 90 tablet 1  . Probiotic Product (PROBIOTIC FORMULA PO) Take 1 tablet by mouth daily.    . ranitidine (ZANTAC) 150 MG tablet Take 1 tablet (150 mg total) by mouth 2 (two) times daily. 180 tablet 3   No facility-administered medications prior to visit.     Allergies:  Allergies  Allergen Reactions  . Bee Venom Swelling      Review of Systems: See HPI for pertinent ROS. All other ROS negative.    Physical Exam: Blood pressure 130/74, pulse 74, temperature 97.8 F (36.6 C), temperature source Oral, resp. rate 14, height 5' (1.524 m), weight 77.7 kg (171 lb 3.2 oz), SpO2 98 %., Body mass index is 33.44 kg/m. General:  WNWD WF. Appears in no acute distress. Neck: Supple. No thyromegaly. No lymphadenopathy. Lungs: Clear bilaterally to auscultation without wheezes, rales, or rhonchi. Breathing is unlabored. Heart: Regular rhythm. No murmurs, rubs, or gallops. Abdomen: Soft, non-tender, non-distended with normoactive bowel sounds. No hepatomegaly. No rebound/guarding. No obvious abdominal masses. Msk:  Strength and tone normal for age. Extremities/Skin: Warm and dry.  Neuro: Alert and oriented X 3. Moves all extremities spontaneously. Gait is normal. CNII-XII grossly in tact. Psych:  Responds to questions appropriately with a normal affect.     ASSESSMENT AND PLAN:  77 y.o. year old female with  1. Irritable bowel syndrome with constipation Today I gave her 2 sample bottles of Linzess 290 mcg.  This is a total of 8 capsules.  She is to take 1 every morning prior to eating/drinking.    If this causes diarrhea/loose stools then she will decrease to using the Linzess 72 mcg dose.  I also gave her 2 bottles of this for total of 8 of these. She is to stop using her current stool softeners and try using this Linzess instead. She is to call me on Monday and let me know how this is working and which dose is best and then I can send in prescription.   Marin Olp [place], [place], [facility] 08/24/2017 8:43 AM

## 2017-09-11 ENCOUNTER — Telehealth: Payer: Self-pay | Admitting: Physician Assistant

## 2017-09-11 NOTE — Telephone Encounter (Signed)
At her visit I had given her samples of 2 different doses to try so I called her to verify which dose was working well for her.  She states the 290 mcg dose. Send prescription for Linzess to 90 mcg 1 p.o. every morning #90+3 refills.

## 2017-09-11 NOTE — Telephone Encounter (Signed)
Patient states the linzess is working and is requesting a rx

## 2017-09-11 NOTE — Telephone Encounter (Signed)
Patient is calling to get an rx sent to [facility] for 90 day supply of linzess if possible  [phone]

## 2017-09-13 MED ORDER — LINACLOTIDE 290 MCG PO CAPS: 290.0000 ug | ORAL_CAPSULE | Freq: Every day | ORAL | 3 refills | Status: DC

## 2017-09-13 NOTE — Telephone Encounter (Signed)
rx sent to [facility] as requested by the patient

## 2017-09-13 NOTE — Telephone Encounter (Signed)
Yes.  Dragon dictated it wrong as to90 instead of 290.

## 2017-09-13 NOTE — Telephone Encounter (Signed)
Should this rx be for 290 mcg?

## 2017-09-19 ENCOUNTER — Other Ambulatory Visit: Payer: Self-pay | Admitting: Family Medicine

## 2017-11-06 ENCOUNTER — Other Ambulatory Visit: Payer: Self-pay

## 2017-11-06 MED ORDER — LINACLOTIDE 290 MCG PO CAPS: 290.0000 ug | ORAL_CAPSULE | Freq: Every day | ORAL | 3 refills | Status: DC

## 2017-11-14 DIAGNOSIS — L720 Epidermal cyst: Secondary | ICD-10-CM | POA: Diagnosis not present

## 2017-11-14 DIAGNOSIS — D2271 Melanocytic nevi of right lower limb, including hip: Secondary | ICD-10-CM | POA: Diagnosis not present

## 2017-11-14 DIAGNOSIS — L821 Other seborrheic keratosis: Secondary | ICD-10-CM | POA: Diagnosis not present

## 2017-11-14 DIAGNOSIS — D225 Melanocytic nevi of trunk: Secondary | ICD-10-CM | POA: Diagnosis not present

## 2017-11-14 DIAGNOSIS — L570 Actinic keratosis: Secondary | ICD-10-CM | POA: Diagnosis not present

## 2017-11-23 DIAGNOSIS — E0590 Thyrotoxicosis, unspecified without thyrotoxic crisis or storm: Secondary | ICD-10-CM | POA: Diagnosis not present

## 2017-11-30 DIAGNOSIS — E0590 Thyrotoxicosis, unspecified without thyrotoxic crisis or storm: Secondary | ICD-10-CM | POA: Diagnosis not present

## 2017-11-30 DIAGNOSIS — I10 Essential (primary) hypertension: Secondary | ICD-10-CM | POA: Diagnosis not present

## 2017-12-18 ENCOUNTER — Ambulatory Visit: Payer: Medicare Other | Admitting: Physician Assistant

## 2018-01-01 ENCOUNTER — Ambulatory Visit: Payer: Medicare Other | Admitting: Physician Assistant

## 2018-02-21 ENCOUNTER — Other Ambulatory Visit: Payer: Self-pay

## 2018-02-21 ENCOUNTER — Encounter: Payer: Self-pay | Admitting: Physician Assistant

## 2018-02-21 ENCOUNTER — Ambulatory Visit (INDEPENDENT_AMBULATORY_CARE_PROVIDER_SITE_OTHER): Payer: Medicare Other | Admitting: Physician Assistant

## 2018-02-21 VITALS — BP 118/60 | HR 61 | Temp 97.9°F | Resp 16 | Ht 60.0 in | Wt 168.2 lb

## 2018-02-21 DIAGNOSIS — E559 Vitamin D deficiency, unspecified: Secondary | ICD-10-CM

## 2018-02-21 DIAGNOSIS — I10 Essential (primary) hypertension: Secondary | ICD-10-CM

## 2018-02-21 DIAGNOSIS — E119 Type 2 diabetes mellitus without complications: Secondary | ICD-10-CM | POA: Diagnosis not present

## 2018-02-21 DIAGNOSIS — E0590 Thyrotoxicosis, unspecified without thyrotoxic crisis or storm: Secondary | ICD-10-CM

## 2018-02-21 DIAGNOSIS — E785 Hyperlipidemia, unspecified: Secondary | ICD-10-CM

## 2018-02-21 NOTE — Progress Notes (Signed)
Patient ID: Jackie Lynch MRN: [id number], DOB: 1941/05/11, 77 y.o. Date of Encounter: 02/21/2018,   Chief Complaint: Physical (CPE)  HPI: 77 y.o. y/o female      03/16/2017: here for CPE.   She reports that she had seen me in the past-- prior to me going out on maternity leave with my first pregnancy. Says that when I went out on maternity leave, they had her see Dr. Jacelyn Grip here. Then she was scheduled with Dr. Dennard Schaumann and his continued to see him but decided that she wanted to return to seeing me. She can't believe my son is now 23 years old!! She talks about her grandchildren and how sweet they are.  Asked if she sees any specialist. She reports the following: She sees Dr. Collene Mares-- GI --Says that she has IBS and hemorrhoids.  Regarding the IBS-- she says that she "has gotten to where she is almost scared to even go to church because afraid she is going to have symptoms that are embarrassing" to her. Regarding the hemorrhoids-- says that they bleed sometimes.  She sees Dr. Bubba Camp regarding her thyroid.  Says that she last saw her this past March and is scheduled to see her again in November.  Reports that she sees Dr. Berenice Primas for orthopedics. Has had knee replacement and hip replacement.  Sees Dr. [place] -- Dermatology every year for skin cancer screen/melanoma screen.  Reports that she does have eye exam routinely with Dr. Gershon Crane. Last eye exam was April 2018. Had cataract surgery in April 2018.  Reports that she has had a hysterectomy.Not due to cancer. Was told needs no further pap smear.  She has no specific concerns to address today.  Is scheduled for complete physical exam today.    06/19/2017: She has no specific complaints or concerns to address today. She is taking blood pressure medications as directed.  No lightheadedness or other adverse effects. She is taking pravastatin and fenofibrate as directed.  No myalgias or other adverse effects. She continues to see  Dr. Suzette Battiest regarding her thyroid.    Addendum Added 08/24/2017: She had office visit with me regarding GI issues today.  At that visit she brought in [place] screening. Performed 08/16/2016 Carotid artery disease--left was normal.  Right was mild. Atrial fibrillation screen was normal. Abdominal aorta aneurysm screen was normal. Peripheral arterial disease screen was normal. Osteoporosis screen was normal.  She was in the low probability section. BMI was normal.   02/21/2018: She talks about her great great great grandson that she spend some time with.  Says that he is 6 months old. He also reports that sometimes at the end of the day her feet feet have been swelling recently.  That she has been taking a fluid pill in the morning and that some of those days she has been taking another fluid pill in the evening. Today I have told her to stop taking that second dose of diuretic.  Have told her that instead that sometime during the day to lie either on the bed or the couch and prop her feet up on pillows to elevate her feet.  Today she also talks about Linzess.  Says that the medicine is very good as far as getting rid of her pain and bloating.  However states that she never knows exactly when it is going to kick in/take effect and that for her when it does take a fact that all the sudden she has to go to the bathroom  quickly.  Says "it is fine if for somebody that is going to be in the house but is not good for somebody that needs to be out doing things."  She is taking blood pressure medications as directed.  No lightheadedness or other adverse effects. She is taking pravastatin and fenofibrate as directed.  No myalgias or other adverse effects. She continues to see Dr. Suzette Battiest regarding her thyroid. Vitamin D supplement was added after labs were checked 06/19/2017.  We will recheck vitamin D level today.      Review of Systems: Consitutional: No fever, chills, fatigue, night sweats,  lymphadenopathy. No significant/unexplained weight changes. Eyes: No visual changes, eye redness, or discharge. ENT/Mouth: No ear pain, sore throat, nasal drainage, or sinus pain. Cardiovascular: No chest pressure,heaviness, tightness or squeezing, even with exertion. No increased shortness of breath or dyspnea on exertion.No palpitations, edema, orthopnea, PND. Respiratory: No cough, hemoptysis, SOB, or wheezing. Gastrointestinal: No anorexia, dysphagia, reflux, pain, nausea, vomiting, hematemesis, melena.--------------------See HPI regarding IBS and Hemorrhoids.-------------------------------- Breast: No mass, nodules, bulging, or retraction. No skin changes or inflammation. No nipple discharge. No lymphadenopathy. Genitourinary: No dysuria, hematuria, incontinence, vaginal discharge, pruritis, burning, abnormal bleeding, or pain. Musculoskeletal: No decreased ROM, No joint pain or swelling. No significant pain in neck, back, or extremities. Skin: No rash, pruritis, or concerning lesions. Neurological: No headache, dizziness, syncope, seizures, tremors, memory loss, coordination problems, or paresthesias. Psychological: No anxiety, depression, hallucinations, SI/HI. Endocrine: No polydipsia, polyphagia, polyuria .No increased fatigue. No palpitations/rapid heart rate. No significant/unexplained weight change. All other systems were reviewed and are otherwise negative.  Past Medical History:  Diagnosis Date  . Anemia   . Arthritis   . Constipation   . Diverticulosis of colon   . 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 GERD (gastroesophageal reflux disease)   . H/O osteopenia   . Hyperlipidemia   . Hypertension   . Hyperthyroidism   . Nocturia   . PONV (postoperative nausea and vomiting)   . Renal cyst, congenital, right      Past Surgical History:  Procedure Laterality Date  . ABDOMINAL HYSTERECTOMY    . CHOLECYSTECTOMY    . JOINT REPLACEMENT    . KNEE  ARTHROSCOPY     right and left  . TOTAL HIP ARTHROPLASTY  11/14/2011   Procedure: TOTAL HIP ARTHROPLASTY;  Surgeon: Alta Corning, MD;  Location: [place] [place];  Service: Orthopedics;  Laterality: Left;  . TOTAL KNEE ARTHROPLASTY Right 08/31/2012   Procedure: TOTAL KNEE ARTHROPLASTY;  Surgeon: Alta Corning, MD;  Location: [place] [facility];  Service: Orthopedics;  Laterality: Right;  . TUBAL LIGATION      Home Meds:  Outpatient Medications Prior to Visit  Medication Sig Dispense Refill  . albuterol (PROVENTIL HFA;VENTOLIN HFA) 108 (90 BASE) MCG/ACT inhaler Inhale 2 puffs into the lungs every 6 (six) hours as needed for wheezing. 1 Inhaler 0  . amLODipine (NORVASC) 10 MG tablet TAKE 1 TABLET BY MOUTH  EVERY DAY 90 tablet 3  . CALCIUM-VITAMIN D PO Take 1 tablet by mouth daily. Reported on 09/30/2015    . Cholecalciferol (HM VITAMIN D3) 4000 units CAPS Take 4000iu daily. Continue Calcium with Vit D. 30 capsule   . cloNIDine (CATAPRES) 0.1 MG tablet TAKE 1 TABLET BY MOUTH 3  TIMES A DAY FOR ELEVATED  BLOOD PRESSURE 270 tablet 3  . cyclobenzaprine (FLEXERIL) 10 MG tablet Take 1 tablet (10 mg total) by mouth at bedtime. 30 tablet 0  . diazepam (VALIUM) 5  MG tablet Take 1 tablet (5 mg total) by mouth every 8 (eight) hours as needed for anxiety. 30 tablet 1  . docusate sodium (COLACE) 100 MG capsule Take 100 mg by mouth daily.    . fenofibrate 160 MG tablet TAKE 1 TABLET BY MOUTH  EVERY DAY 90 tablet 1  . hydrochlorothiazide (HYDRODIURIL) 25 MG tablet TAKE 1 TABLET BY MOUTH  EVERY DAY 90 tablet 1  . linaclotide (LINZESS) 290 MCG CAPS capsule Take 1 capsule (290 mcg total) by mouth daily before breakfast. 90 capsule 3  . losartan (COZAAR) 100 MG tablet TAKE 1 TABLET BY MOUTH  EVERY DAY 90 tablet 1  . methimazole (TAPAZOLE) 10 MG tablet Take 10 mg by mouth 3 (three) times a week.     . metoprolol tartrate (LOPRESSOR) 50 MG tablet TAKE 1 TABLET BY MOUTH  TWICE A DAY 180 tablet 1  . neomycin-polymyxin-hydrocortisone  (CORTISPORIN) otic solution Place 3 drops into the left ear 4 (four) times daily. For 5 days 10 mL 0  . ondansetron (ZOFRAN) 4 MG tablet Take 1 tablet (4 mg total) by mouth every 6 (six) hours as needed for nausea. 40 tablet 0  . pravastatin (PRAVACHOL) 80 MG tablet TAKE 1 TABLET BY MOUTH  EVERY DAY 90 tablet 1  . Probiotic Product (PROBIOTIC FORMULA PO) Take 1 tablet by mouth daily.    . ranitidine (ZANTAC) 150 MG tablet Take 1 tablet (150 mg total) by mouth 2 (two) times daily. 180 tablet 3  . amLODipine (NORVASC) 10 MG tablet TAKE 1 TABLET BY MOUTH  EVERY DAY 90 tablet 1   No facility-administered medications prior to visit.     Allergies:  Allergies  Allergen Reactions  . Bee Venom Swelling    Social History   Socioeconomic History  . Marital status: Married    Spouse name: Not on file  . Number of children: Not on file  . Years of education: Not on file  . Highest education level: Not on file  Occupational History  . Not on file  Social Needs  . Financial resource strain: Not on file  . Food insecurity:    Worry: Not on file    Inability: Not on file  . Transportation needs:    Medical: Not on file    Non-medical: Not on file  Tobacco Use  . Smoking status: Never Smoker  . Smokeless tobacco: Never Used  Substance and Sexual Activity  . Alcohol use: No  . Drug use: No  . Sexual activity: Never  Lifestyle  . Physical activity:    Days per week: Not on file    Minutes per session: Not on file  . Stress: Not on file  Relationships  . Social connections:    Talks on phone: Not on file    Gets together: Not on file    Attends religious service: Not on file    Active member of club or organization: Not on file    Attends meetings of clubs or organizations: Not on file    Relationship status: Not on file  . Intimate partner violence:    Fear of current or ex partner: Not on file    Emotionally abused: Not on file    Physically abused: Not on file    Forced  sexual activity: Not on file  Other Topics Concern  . Not on file  Social History Narrative  . Not on file    Family History  Problem Relation Age of Onset  .  Anesthesia problems Neg Hx     Physical Exam: Blood pressure 118/60, pulse 61, temperature 97.9 F (36.6 C), temperature source Oral, resp. rate 16, height 5' (1.524 m), weight 76.3 kg, SpO2 96 %., Body mass index is 32.85 kg/m. General: WNWD WF. Appears in no acute distress.  Neck: Supple. No thyromegaly. No lymphadenopathy. No carotid bruits. Lungs: Clear bilaterally to auscultation without wheezes, rales, or rhonchi. Breathing is unlabored. Heart: RRR with S1 S2. No murmurs, rubs, or gallops. Abdomen: Soft, non-tender, non-distended with normoactive bowel sounds. No hepatomegaly. No rebound/guarding. No obvious abdominal masses. Musculoskeletal:  Strength and tone normal for age. Extremities/Skin: Warm and dry.  No LE edema. Neuro: Alert and oriented X 3. Moves all extremities spontaneously. Gait is normal. CNII-XII grossly in tact. Psych:  Responds to questions appropriately with a normal affect.   Assessment/Plan:  77 y.o. y/o female here for  Essential hypertension 02/21/2018:  Blood pressure is at goal. Continue current medications. Check lab to monitor. - COMPLETE METABOLIC PANEL WITH GFR  Controlled type 2 diabetes mellitus without complication, without long-term current use of insulin ([place]) 02/21/2018:   Today will check A1c .  If this A1C remains good again, can wait 6 months for next follow-up visit, lab.  She already sees Dr. Gershon Crane for annual eye exams.  She is on ARB and statin.   Hyperlipidemia, unspecified hyperlipidemia ty 02/21/2018:   She is on pravastatin and fenofibrate.  Continue current medications. Check labs to monitor. - COMPLETE METABOLIC PANEL WITH GFR -FLP  Hyperthyroidism 02/21/2018:   Managed by Dr. Bubba Camp  Irritable bowel syndrome with diarrhea 02/21/2018:   Managed by Dr. Collene Mares.   Also, she had office visit with me soon after her other visit on 06/19/2017.  At that other visit -- added Linzess.  Vitamin D deficiency At lab 03/16/17 vitamin D level was low.  Recommend that she start vitamin D 4000 units daily. At visit 06/19/17 she reports that she has not started the vitamin D yet but is going to start it now. 02/21/2018: At lab 06/19/17 -- vitamin D was low.  Added vitamin D 4000 units daily at that time.  Recheck vitamin D level.    --------------------FOR PREVENTIVE CARE------SEE CPE NOTE 03/16/2017-------------------------------------------  Addendum Added 08/24/2017: She had office visit with me regarding GI issues today.  At that visit she brought in [place] screening. Performed 08/16/2016 Carotid artery disease--left was normal.  Right was mild. Atrial fibrillation screen was normal. Abdominal aorta aneurysm screen was normal. Peripheral arterial disease screen was normal. Osteoporosis screen was normal.  She was in the low probability section. BMI was normal.    Signed, [address] [place], [place], [facility] 02/21/2018 8:10 AM

## 2018-02-22 LAB — COMPLETE METABOLIC PANEL WITH GFR
AG RATIO: 1.6 (calc) (ref 1.0–2.5)
ALT: 15 U/L (ref 6–29)
AST: 21 U/L (ref 10–35)
Albumin: 4.1 g/dL (ref 3.6–5.1)
Alkaline phosphatase (APISO): 30 U/L — ABNORMAL LOW (ref 33–130)
BUN/Creatinine Ratio: 19 (calc) (ref 6–22)
BUN: 23 mg/dL (ref 7–25)
CALCIUM: 9.4 mg/dL (ref 8.6–10.4)
CHLORIDE: 101 mmol/L (ref 98–110)
CO2: 27 mmol/L (ref 20–32)
Creat: 1.22 mg/dL — ABNORMAL HIGH (ref 0.60–0.93)
GFR, Est African American: 50 mL/min/{1.73_m2} — ABNORMAL LOW (ref >=60)
GFR, Est Non African American: 43 mL/min/{1.73_m2} — ABNORMAL LOW (ref >=60)
Globulin: 2.6 g/dL (calc) (ref 1.9–3.7)
Glucose, Bld: 114 mg/dL — ABNORMAL HIGH (ref 65–99)
POTASSIUM: 3.7 mmol/L (ref 3.5–5.3)
Sodium: 139 mmol/L (ref 135–146)
Total Bilirubin: 0.5 mg/dL (ref 0.2–1.2)
Total Protein: 6.7 g/dL (ref 6.1–8.1)

## 2018-02-22 LAB — HEMOGLOBIN A1C
Hgb A1c MFr Bld: 6.3 % of total Hgb — ABNORMAL HIGH (ref <5.7)
Mean Plasma Glucose: 134 (calc)
eAG (mmol/L): 7.4 (calc)

## 2018-02-22 LAB — LIPID PANEL
Cholesterol: 144 mg/dL (ref <200)
HDL: 42 mg/dL — ABNORMAL LOW (ref >50)
LDL Cholesterol (Calc): 85 mg/dL (calc)
Non-HDL Cholesterol (Calc): 102 mg/dL (calc) (ref <130)
TRIGLYCERIDES: 82 mg/dL (ref <150)
Total CHOL/HDL Ratio: 3.4 (calc) (ref <5.0)

## 2018-02-22 LAB — VITAMIN D 25 HYDROXY (VIT D DEFICIENCY, FRACTURES): VIT D 25 HYDROXY: 17 ng/mL — AB (ref 30–100)

## 2018-02-26 ENCOUNTER — Telehealth: Payer: Self-pay

## 2018-02-26 NOTE — Telephone Encounter (Signed)
-----   Message from Orlena Sheldon, PA-C sent at 02/22/2018 12:04 PM EDT ----- Medication list includes vitamin D 4000 units daily.  Have patient look at her medicine bottles and confirm whether she is truly taking this dose every single day.  Then get back to me with this information.  Remainder of labs are good/stable.  Continue other medications the same.

## 2018-02-26 NOTE — Telephone Encounter (Signed)
Patient called to get her lab results, patient states she sometimes forget to take her vitamin D, but that it is 4000 units daily, Patient was advised to take Vitamin D daily

## 2018-02-26 NOTE — Telephone Encounter (Signed)
Vitamin D level is very low. Instruct her to take 2 of the 4000 units for total of 8000 units daily for 3 weeks Then can go to just 1 of the 4000 units daily. Tell her to put the vitamin D with her other medicines to make sure she takes daily just like she would any prescription medicine.

## 2018-03-01 NOTE — Telephone Encounter (Signed)
Call placed to patient to discuss her vitamin D, Patient states she was not even sure if she was taking Vitamin D 4000 units. I then explained that I confirmed when I called patient stated she misunderstood me.  Patient states she will give me a call back 8/30 to confirm how much Vitamin D she is taking

## 2018-03-02 MED ORDER — ERGOCALCIFEROL 8000 UNIT/ML PO SOLN: 8000.0000 [IU] | Freq: Every day | ORAL | 2 refills | Status: DC

## 2018-03-02 NOTE — Telephone Encounter (Signed)
Patient requested we send Vitamin D to pharmacy she confirmed she is not taking Vitamin d 4000 units

## 2018-03-20 ENCOUNTER — Encounter: Payer: Self-pay | Admitting: Family Medicine

## 2018-03-20 ENCOUNTER — Ambulatory Visit (INDEPENDENT_AMBULATORY_CARE_PROVIDER_SITE_OTHER): Payer: Medicare Other | Admitting: Family Medicine

## 2018-03-20 ENCOUNTER — Encounter (INDEPENDENT_AMBULATORY_CARE_PROVIDER_SITE_OTHER): Payer: Self-pay

## 2018-03-20 VITALS — BP 152/80 | HR 66 | Temp 98.2°F | Resp 16 | Ht 60.0 in | Wt 167.0 lb

## 2018-03-20 DIAGNOSIS — J209 Acute bronchitis, unspecified: Secondary | ICD-10-CM

## 2018-03-20 MED ORDER — PANTOPRAZOLE SODIUM 40 MG PO TBEC: 40.0000 mg | DELAYED_RELEASE_TABLET | Freq: Two times a day (BID) | ORAL | 0 refills | Status: DC

## 2018-03-20 MED ORDER — PREDNISONE 20 MG PO TABS: ORAL_TABLET | ORAL | 0 refills | Status: DC

## 2018-03-20 MED ORDER — AZITHROMYCIN 250 MG PO TABS: ORAL_TABLET | ORAL | 0 refills | Status: DC

## 2018-03-20 MED ORDER — ALBUTEROL SULFATE HFA 108 (90 BASE) MCG/ACT IN AERS: 2.0000 | INHALATION_SPRAY | Freq: Four times a day (QID) | RESPIRATORY_TRACT | 0 refills | Status: DC | PRN

## 2018-03-20 NOTE — Progress Notes (Signed)
Subjective:    Patient ID: Jackie Lynch, female    DOB: 29-Aug-1940, 77 y.o.   MRN: [id number]  HPI Patient has been sick for approximately 6 days.  Symptoms include a cough productive of green sputum, worsening shortness of breath, subjective fevers.  On exam, the patient is wheezing bilaterally with rhonchorous breath sounds.  She also reports mild rhinorrhea.  She denies any sore throat.  She is reporting central pleurisy worse with deep inspiration.  She also reports indigestion unrelieved by omeprazole 20 mg a day and Zantac 150 mg twice daily.  She has breakthrough burning in the epigastric area that comes up into her chest when she lies supine at night.  She reports frequent heartburn Past Medical History:  Diagnosis Date  . Anemia   . Arthritis   . Constipation   . Diverticulosis of colon   . 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 GERD (gastroesophageal reflux disease)   . H/O osteopenia   . Hyperlipidemia   . Hypertension   . Hyperthyroidism   . Nocturia   . PONV (postoperative nausea and vomiting)   . Renal cyst, congenital, right    Past Surgical History:  Procedure Laterality Date  . ABDOMINAL HYSTERECTOMY    . CHOLECYSTECTOMY    . JOINT REPLACEMENT    . KNEE ARTHROSCOPY     right and left  . TOTAL HIP ARTHROPLASTY  11/14/2011   Procedure: TOTAL HIP ARTHROPLASTY;  Surgeon: Alta Corning, MD;  Location: [place];  Service: Orthopedics;  Laterality: Left;  . TOTAL KNEE ARTHROPLASTY Right 08/31/2012   Procedure: TOTAL KNEE ARTHROPLASTY;  Surgeon: Alta Corning, MD;  Location: [place];  Service: Orthopedics;  Laterality: Right;  . TUBAL LIGATION     Current Outpatient Medications on File Prior to Visit  Medication Sig Dispense Refill  . albuterol (PROVENTIL HFA;VENTOLIN HFA) 108 (90 BASE) MCG/ACT inhaler Inhale 2 puffs into the lungs every 6 (six) hours as needed for wheezing. 1 Inhaler 0  . amLODipine (NORVASC) 10 MG tablet TAKE 1 TABLET BY MOUTH  EVERY  DAY 90 tablet 3  . CALCIUM-VITAMIN D PO Take 1 tablet by mouth daily. Reported on 09/30/2015    . Cholecalciferol (HM VITAMIN D3) 4000 units CAPS Take 4000iu daily. Continue Calcium with Vit D. 30 capsule   . cloNIDine (CATAPRES) 0.1 MG tablet TAKE 1 TABLET BY MOUTH 3  TIMES A DAY FOR ELEVATED  BLOOD PRESSURE 270 tablet 3  . cyclobenzaprine (FLEXERIL) 10 MG tablet Take 1 tablet (10 mg total) by mouth at bedtime. 30 tablet 0  . diazepam (VALIUM) 5 MG tablet Take 1 tablet (5 mg total) by mouth every 8 (eight) hours as needed for anxiety. 30 tablet 1  . docusate sodium (COLACE) 100 MG capsule Take 100 mg by mouth daily.    . ergocalciferol (DRISDOL) 8000 UNIT/ML drops Take 1 mL (8,000 Units total) by mouth daily. 60 mL 2  . fenofibrate 160 MG tablet TAKE 1 TABLET BY MOUTH  EVERY DAY 90 tablet 1  . hydrochlorothiazide (HYDRODIURIL) 25 MG tablet TAKE 1 TABLET BY MOUTH  EVERY DAY 90 tablet 1  . linaclotide (LINZESS) 290 MCG CAPS capsule Take 1 capsule (290 mcg total) by mouth daily before breakfast. 90 capsule 3  . losartan (COZAAR) 100 MG tablet TAKE 1 TABLET BY MOUTH  EVERY DAY 90 tablet 1  . methimazole (TAPAZOLE) 10 MG tablet Take 10 mg by mouth 3 (three) times a week.     Marland Kitchen  metoprolol tartrate (LOPRESSOR) 50 MG tablet TAKE 1 TABLET BY MOUTH  TWICE A DAY 180 tablet 1  . neomycin-polymyxin-hydrocortisone (CORTISPORIN) otic solution Place 3 drops into the left ear 4 (four) times daily. For 5 days 10 mL 0  . ondansetron (ZOFRAN) 4 MG tablet Take 1 tablet (4 mg total) by mouth every 6 (six) hours as needed for nausea. 40 tablet 0  . pravastatin (PRAVACHOL) 80 MG tablet TAKE 1 TABLET BY MOUTH  EVERY DAY 90 tablet 1  . Probiotic Product (PROBIOTIC FORMULA PO) Take 1 tablet by mouth daily.    . ranitidine (ZANTAC) 150 MG tablet Take 1 tablet (150 mg total) by mouth 2 (two) times daily. (Patient not taking: Reported on 03/20/2018) 180 tablet 3   No current facility-administered medications on file prior to  visit.    Allergies  Allergen Reactions  . Bee Venom Swelling   Social History   Socioeconomic History  . Marital status: Married    Spouse name: Not on file  . Number of children: Not on file  . Years of education: Not on file  . Highest education level: Not on file  Occupational History  . Not on file  Social Needs  . Financial resource strain: Not on file  . Food insecurity:    Worry: Not on file    Inability: Not on file  . Transportation needs:    Medical: Not on file    Non-medical: Not on file  Tobacco Use  . Smoking status: Never Smoker  . Smokeless tobacco: Never Used  Substance and Sexual Activity  . Alcohol use: No  . Drug use: No  . Sexual activity: Never  Lifestyle  . Physical activity:    Days per week: Not on file    Minutes per session: Not on file  . Stress: Not on file  Relationships  . Social connections:    Talks on phone: Not on file    Gets together: Not on file    Attends religious service: Not on file    Active member of club or organization: Not on file    Attends meetings of clubs or organizations: Not on file    Relationship status: Not on file  . Intimate partner violence:    Fear of current or ex partner: Not on file    Emotionally abused: Not on file    Physically abused: Not on file    Forced sexual activity: Not on file  Other Topics Concern  . Not on file  Social History Narrative  . Not on file      Review of Systems  All other systems reviewed and are negative.      Objective:   Physical Exam  Constitutional: She appears well-developed and well-nourished. No distress.  HENT:  Left Ear: External ear normal.  Nose: Nose normal.  Mouth/Throat: Oropharynx is clear and moist. No oropharyngeal exudate.  Eyes: Pupils are equal, round, and reactive to light. Conjunctivae are normal.  Cardiovascular: Normal rate, regular rhythm and normal heart sounds. Exam reveals no gallop and no friction rub.  No murmur  heard. Pulmonary/Chest: Effort normal. No stridor. No respiratory distress. She has wheezes. She has rhonchi.  Skin: She is not diaphoretic.  Vitals reviewed.         Assessment & Plan:  Bronchitis with bronchospasm  Patient has bronchitis with bronchospasm.  Begin prednisone taper pack with albuterol 2 puffs inhaled every 6 hours as needed for wheezing.  Use Z-Pak for bronchitis  given the purulent sputum and worsening symptoms.  Reassess in 1 week if no better or sooner if worse.  Discontinue omeprazole and replace with Protonix 40 mg p.o. twice daily and reassess in 1 month

## 2018-03-26 ENCOUNTER — Telehealth: Payer: Self-pay | Admitting: Family Medicine

## 2018-03-26 NOTE — Telephone Encounter (Signed)
Patient called [facility] stating she is better but it is not completely gone and wants to know if you would call in another round of antibxs to knock it completely out???? Also stomach meds are working great.

## 2018-03-27 DIAGNOSIS — Z803 Family history of malignant neoplasm of breast: Secondary | ICD-10-CM | POA: Diagnosis not present

## 2018-03-27 DIAGNOSIS — Z1231 Encounter for screening mammogram for malignant neoplasm of breast: Secondary | ICD-10-CM | POA: Diagnosis not present

## 2018-03-27 LAB — HM MAMMOGRAPHY

## 2018-03-27 NOTE — Telephone Encounter (Signed)
She is on a zpack which is slowly released in her system still even after she stops taking the pills. No need for more antibiotics unless she gets worse.

## 2018-03-27 NOTE — Telephone Encounter (Signed)
Patient aware of providers recommendations.  

## 2018-04-09 ENCOUNTER — Encounter: Payer: Self-pay | Admitting: *Deleted

## 2018-04-11 ENCOUNTER — Ambulatory Visit (INDEPENDENT_AMBULATORY_CARE_PROVIDER_SITE_OTHER): Payer: Medicare Other | Admitting: Family Medicine

## 2018-04-11 DIAGNOSIS — Z23 Encounter for immunization: Secondary | ICD-10-CM

## 2018-05-10 ENCOUNTER — Other Ambulatory Visit: Payer: Self-pay | Admitting: Family Medicine

## 2018-05-16 ENCOUNTER — Other Ambulatory Visit: Payer: Self-pay | Admitting: Family Medicine

## 2018-05-16 MED ORDER — PANTOPRAZOLE SODIUM 40 MG PO TBEC: 40.0000 mg | DELAYED_RELEASE_TABLET | Freq: Two times a day (BID) | ORAL | 3 refills | Status: DC

## 2018-05-29 DIAGNOSIS — E0590 Thyrotoxicosis, unspecified without thyrotoxic crisis or storm: Secondary | ICD-10-CM | POA: Diagnosis not present

## 2018-07-05 ENCOUNTER — Telehealth: Payer: Self-pay | Admitting: Family Medicine

## 2018-07-05 NOTE — Telephone Encounter (Signed)
Patient called in asking about the forms she brought in for jury duty. She states that she can't do jury duty due to her health. She wanted me to remind you that she is 36 now and she doesn't feel like she can do this.  This is in your green folder.  CB# [phone]

## 2018-07-26 ENCOUNTER — Encounter (INDEPENDENT_AMBULATORY_CARE_PROVIDER_SITE_OTHER): Payer: Self-pay

## 2018-08-23 ENCOUNTER — Ambulatory Visit: Payer: Medicare Other | Admitting: Physician Assistant

## 2018-09-17 ENCOUNTER — Telehealth: Payer: Self-pay | Admitting: Family Medicine

## 2018-09-17 ENCOUNTER — Other Ambulatory Visit: Payer: Self-pay | Admitting: Family Medicine

## 2018-09-17 ENCOUNTER — Encounter: Payer: Self-pay | Admitting: Family Medicine

## 2018-09-17 ENCOUNTER — Ambulatory Visit: Payer: Medicare Other | Admitting: Family Medicine

## 2018-09-17 MED ORDER — SULFAMETHOXAZOLE-TRIMETHOPRIM 800-160 MG PO TABS: 1.0000 | ORAL_TABLET | Freq: Two times a day (BID) | ORAL | 0 refills | Status: DC

## 2018-09-17 NOTE — Telephone Encounter (Signed)
Med sent to pharm and daughter in law aware. Called [facility] and cxd rx sent this am

## 2018-09-17 NOTE — Telephone Encounter (Signed)
Pt would like [place] to change bactrim to [facility]. Call daughter in law once complete [phone].

## 2018-09-21 ENCOUNTER — Ambulatory Visit (INDEPENDENT_AMBULATORY_CARE_PROVIDER_SITE_OTHER): Payer: Medicare Other | Admitting: Family Medicine

## 2018-09-21 ENCOUNTER — Encounter: Payer: Self-pay | Admitting: Family Medicine

## 2018-09-21 ENCOUNTER — Other Ambulatory Visit: Payer: Self-pay

## 2018-09-21 VITALS — BP 130/74 | HR 76 | Temp 98.1°F | Resp 16 | Ht 60.0 in

## 2018-09-21 DIAGNOSIS — B028 Zoster with other complications: Secondary | ICD-10-CM | POA: Diagnosis not present

## 2018-09-21 DIAGNOSIS — B0230 Zoster ocular disease, unspecified: Secondary | ICD-10-CM | POA: Diagnosis not present

## 2018-09-21 MED ORDER — VALACYCLOVIR HCL 1 G PO TABS: 1000.0000 mg | ORAL_TABLET | Freq: Three times a day (TID) | ORAL | 0 refills | Status: DC

## 2018-09-21 MED ORDER — HYDROCODONE-ACETAMINOPHEN 5-325 MG PO TABS: 1.0000 | ORAL_TABLET | Freq: Four times a day (QID) | ORAL | 0 refills | Status: DC | PRN

## 2018-09-21 NOTE — Telephone Encounter (Signed)
Error

## 2018-09-21 NOTE — Progress Notes (Signed)
Subjective:    Patient ID: Jackie Lynch, female    DOB: May 21, 1941, 78 y.o.   MRN: [id number]  HPI Patient has a 3-day history of a painful burning rash on her upper right scalp spreading around her right eye.  The rash consist of erythema with numerous vesicles.  It burns and hurts.  There is no conjunctivitis.  She denies any eye pain or blurry vision as of yet.  She denies any headache or confusion.   Past Medical History:  Diagnosis Date  . Anemia   . Arthritis   . Constipation   . Diverticulosis of colon   . 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 GERD (gastroesophageal reflux disease)   . H/O osteopenia   . Hyperlipidemia   . Hypertension   . Hyperthyroidism   . Nocturia   . PONV (postoperative nausea and vomiting)   . Renal cyst, congenital, right    Past Surgical History:  Procedure Laterality Date  . ABDOMINAL HYSTERECTOMY    . CHOLECYSTECTOMY    . JOINT REPLACEMENT    . KNEE ARTHROSCOPY     right and left  . TOTAL HIP ARTHROPLASTY  11/14/2011   Procedure: TOTAL HIP ARTHROPLASTY;  Surgeon: Alta Corning, MD;  Location: [place];  Service: Orthopedics;  Laterality: Left;  . TOTAL KNEE ARTHROPLASTY Right 08/31/2012   Procedure: TOTAL KNEE ARTHROPLASTY;  Surgeon: Alta Corning, MD;  Location: [place];  Service: Orthopedics;  Laterality: Right;  . TUBAL LIGATION     Current Outpatient Medications on File Prior to Visit  Medication Sig Dispense Refill  . albuterol (PROVENTIL HFA;VENTOLIN HFA) 108 (90 BASE) MCG/ACT inhaler Inhale 2 puffs into the lungs every 6 (six) hours as needed for wheezing. 1 Inhaler 0  . albuterol (PROVENTIL HFA;VENTOLIN HFA) 108 (90 Base) MCG/ACT inhaler Inhale 2 puffs into the lungs every 6 (six) hours as needed for wheezing or shortness of breath. 1 Inhaler 0  . azithromycin (ZITHROMAX) 250 MG tablet 2 tabs poqday1, 1 tab poqday 2-5 6 tablet 0  . CALCIUM-VITAMIN D PO Take 1 tablet by mouth daily. Reported on 09/30/2015    .  Cholecalciferol (HM VITAMIN D3) 4000 units CAPS Take 4000iu daily. Continue Calcium with Vit D. 30 capsule   . cloNIDine (CATAPRES) 0.1 MG tablet TAKE 1 TABLET BY MOUTH 3  TIMES A DAY FOR ELEVATED  BLOOD PRESSURE 270 tablet 3  . cyclobenzaprine (FLEXERIL) 10 MG tablet Take 1 tablet (10 mg total) by mouth at bedtime. 30 tablet 0  . diazepam (VALIUM) 5 MG tablet Take 1 tablet (5 mg total) by mouth every 8 (eight) hours as needed for anxiety. 30 tablet 1  . docusate sodium (COLACE) 100 MG capsule Take 100 mg by mouth daily.    . ergocalciferol (DRISDOL) 8000 UNIT/ML drops Take 1 mL (8,000 Units total) by mouth daily. 60 mL 2  . fenofibrate 160 MG tablet TAKE 1 TABLET BY MOUTH  EVERY DAY 90 tablet 1  . hydrochlorothiazide (HYDRODIURIL) 25 MG tablet TAKE 1 TABLET BY MOUTH  EVERY DAY 90 tablet 1  . linaclotide (LINZESS) 290 MCG CAPS capsule Take 1 capsule (290 mcg total) by mouth daily before breakfast. 90 capsule 3  . losartan (COZAAR) 100 MG tablet TAKE 1 TABLET BY MOUTH  EVERY DAY 90 tablet 1  . methimazole (TAPAZOLE) 10 MG tablet Take 10 mg by mouth 3 (three) times a week.     . metoprolol tartrate (LOPRESSOR) 50 MG tablet TAKE  1 TABLET BY MOUTH  TWICE A DAY 180 tablet 1  . neomycin-polymyxin-hydrocortisone (CORTISPORIN) otic solution Place 3 drops into the left ear 4 (four) times daily. For 5 days 10 mL 0  . ondansetron (ZOFRAN) 4 MG tablet Take 1 tablet (4 mg total) by mouth every 6 (six) hours as needed for nausea. 40 tablet 0  . pantoprazole (PROTONIX) 40 MG tablet Take 1 tablet (40 mg total) by mouth 2 (two) times daily. 180 tablet 3  . pravastatin (PRAVACHOL) 80 MG tablet TAKE 1 TABLET BY MOUTH  EVERY DAY 90 tablet 1  . Probiotic Product (PROBIOTIC FORMULA PO) Take 1 tablet by mouth daily.    Marland Kitchen sulfamethoxazole-trimethoprim (BACTRIM DS,SEPTRA DS) 800-160 MG tablet Take 1 tablet by mouth 2 (two) times daily. 10 tablet 0   No current facility-administered medications on file prior to visit.     Allergies  Allergen Reactions  . Bee Venom Swelling   Social History   Socioeconomic History  . Marital status: Married    Spouse name: Not on file  . Number of children: Not on file  . Years of education: Not on file  . Highest education level: Not on file  Occupational History  . Not on file  Social Needs  . Financial resource strain: Not on file  . Food insecurity:    Worry: Not on file    Inability: Not on file  . Transportation needs:    Medical: Not on file    Non-medical: Not on file  Tobacco Use  . Smoking status: Never Smoker  . Smokeless tobacco: Never Used  Substance and Sexual Activity  . Alcohol use: No  . Drug use: No  . Sexual activity: Never  Lifestyle  . Physical activity:    Days per week: Not on file    Minutes per session: Not on file  . Stress: Not on file  Relationships  . Social connections:    Talks on phone: Not on file    Gets together: Not on file    Attends religious service: Not on file    Active member of club or organization: Not on file    Attends meetings of clubs or organizations: Not on file    Relationship status: Not on file  . Intimate partner violence:    Fear of current or ex partner: Not on file    Emotionally abused: Not on file    Physically abused: Not on file    Forced sexual activity: Not on file  Other Topics Concern  . Not on file  Social History Narrative  . Not on file      Review of Systems  All other systems reviewed and are negative.      Objective:   Physical Exam Vitals signs reviewed.  Constitutional:      General: She is not in acute distress.    Appearance: She is well-developed. She is not diaphoretic.  HENT:     Head:      Left Ear: External ear normal.     Nose: Nose normal.     Mouth/Throat:     Pharynx: No oropharyngeal exudate.  Eyes:     Conjunctiva/sclera: Conjunctivae normal.     Right eye: Right conjunctiva is not injected. No chemosis or exudate.    Left eye: Left  conjunctiva is not injected. No chemosis or exudate.    Pupils: Pupils are equal, round, and reactive to light.  Cardiovascular:     Rate and Rhythm:  Normal rate and regular rhythm.     Heart sounds: Normal heart sounds. No murmur. No friction rub. No gallop.   Pulmonary:     Effort: Pulmonary effort is normal. No respiratory distress.     Breath sounds: No stridor. No decreased breath sounds, wheezing or rhonchi.  Skin:    Findings: Erythema and rash present. Rash is vesicular.           Assessment & Plan:  Herpes zoster with complication  Patient has shingles.  Begin Valtrex 1 g p.o. 3 times daily for 7 days.  Use Norco 5/325 1 p.o. every 6 hours as needed pain.  Recommended she call her ophthalmologist for an eye exam however there is appears to be no evidence of eye involvement at the present time

## 2018-10-08 ENCOUNTER — Telehealth: Payer: Self-pay | Admitting: Family Medicine

## 2018-10-08 MED ORDER — GABAPENTIN 100 MG PO CAPS: 100.0000 mg | ORAL_CAPSULE | Freq: Three times a day (TID) | ORAL | 3 refills | Status: DC

## 2018-10-08 NOTE — Telephone Encounter (Signed)
Pt called Jackie Lynch stating that she has completed her medication and her head is still itching and burning and would like to know if there is something else she can take for it?

## 2018-10-08 NOTE — Telephone Encounter (Signed)
Could try gabapentin 100 mg potid prn nerve pain

## 2018-10-17 ENCOUNTER — Ambulatory Visit (INDEPENDENT_AMBULATORY_CARE_PROVIDER_SITE_OTHER): Payer: Medicare Other | Admitting: Family Medicine

## 2018-10-17 ENCOUNTER — Other Ambulatory Visit: Payer: Self-pay

## 2018-10-17 ENCOUNTER — Encounter: Payer: Self-pay | Admitting: Family Medicine

## 2018-10-17 DIAGNOSIS — J45901 Unspecified asthma with (acute) exacerbation: Secondary | ICD-10-CM

## 2018-10-17 DIAGNOSIS — J302 Other seasonal allergic rhinitis: Secondary | ICD-10-CM

## 2018-10-17 MED ORDER — ALBUTEROL SULFATE HFA 108 (90 BASE) MCG/ACT IN AERS: 2.0000 | INHALATION_SPRAY | Freq: Four times a day (QID) | RESPIRATORY_TRACT | 0 refills | Status: DC | PRN

## 2018-10-17 MED ORDER — HYDROCODONE-HOMATROPINE 5-1.5 MG/5ML PO SYRP: 5.0000 mL | ORAL_SOLUTION | Freq: Three times a day (TID) | ORAL | 0 refills | Status: DC | PRN

## 2018-10-17 MED ORDER — LEVOCETIRIZINE DIHYDROCHLORIDE 5 MG PO TABS: 5.0000 mg | ORAL_TABLET | Freq: Every evening | ORAL | 2 refills | Status: DC

## 2018-10-17 MED ORDER — PREDNISONE 20 MG PO TABS: ORAL_TABLET | ORAL | 0 refills | Status: DC

## 2018-10-17 NOTE — Progress Notes (Signed)
Virtual Visit via Telephone Note  Appointment arranged with [name] for 10/17/18 at  9:15 AM EDT   I discussed the limitations, risks, security and privacy concerns of performing an evaluation and management service by telephone and the availability of in person appointments. I also discussed with the patient that there may be a patient responsible charge related to this service. The patient expressed understanding and agreed to proceed.  Services provided today were via telemedicine through telephone call.  Patient reported their location during encounter was at her home  Patient consented to telephone visit  I conducted telephone visit from [facility]  Referring Provider:   Susy Frizzle, MD   Start of phone call:  9:25 AM  Contacted by telephone and verified that I am speaking with the correct person using two identifiers.   All participants in encounter: Myself and pt  History of Present Illness:  Cough off and on "for a little while cause I thought it was pollen"   Hacking cough for a week or two - she has been washing her deck off with all the pollen and she started coughing and it has continued and gradually worsened. OTC cough medicines, honey with lemon Possibly wheezy but she's not sure if its in her head or lungs Ribs sore from coughing She denies fever, chills, sweats, SOB, sore throat, nasal symptoms. Hx of asthma, rx for SABA before, has none right now. Has not left her house, she is taking care of her husband.  Her daughter is helping to run out and do errands/get meds etc. No sick family members   Observations/Objective: Limited due to telephone encounter, speaking in full complete sentences Frequent coarse wheezy sounding cough and coughing fits, when not coughing no audible wheeze or stridor, voice a little raspy   Assessment and Plan:     ICD-10-CM   1. Asthmatic bronchitis with acute exacerbation, unspecified asthma  severity, unspecified whether persistent J45.901 albuterol (PROVENTIL HFA;VENTOLIN HFA) 108 (90 Base) MCG/ACT inhaler    predniSONE (DELTASONE) 20 MG tablet    HYDROcodone-homatropine (HYCODAN) 5-1.5 MG/5ML syrup   close phone f/up 2 days, consider adding Z-pak, also instructed to take mucinex and allergy meds  2. Seasonal allergies J30.2 levocetirizine (XYZAL) 5 MG tablet   start xyzal, or equivilant, may need singulair?  with asthma hx     Follow Up Instructions: Call me Friday for follow up ER precautions reviewed at length - instructed to go to the ER or call 911 if respiratory distress, SOB, CP, confusion   I discussed the assessment and treatment plan with the patient. The patient was provided an opportunity to ask questions and all were answered. The patient agreed with the plan and demonstrated an understanding of the instructions.   The patient was advised to call back or seek an in-person evaluation if the symptoms worsen or if the condition fails to improve as anticipated.   Phone call concluded at 9:47 am I provided 22 minutes of non-face-to-face time during this encounter.   Delsa Grana, PA-C

## 2018-10-19 ENCOUNTER — Other Ambulatory Visit: Payer: Self-pay

## 2018-10-19 DIAGNOSIS — J209 Acute bronchitis, unspecified: Secondary | ICD-10-CM

## 2018-10-19 DIAGNOSIS — J440 Chronic obstructive pulmonary disease with acute lower respiratory infection: Principal | ICD-10-CM

## 2018-10-19 MED ORDER — AZITHROMYCIN 250 MG PO TABS: 1250.0000 mg | ORAL_TABLET | Freq: Every day | ORAL | 0 refills | Status: DC

## 2018-10-19 MED ORDER — BENZONATATE 200 MG PO CAPS: 200.0000 mg | ORAL_CAPSULE | Freq: Three times a day (TID) | ORAL | 0 refills | Status: DC | PRN

## 2018-11-15 ENCOUNTER — Other Ambulatory Visit: Payer: Self-pay

## 2018-11-15 ENCOUNTER — Encounter: Payer: Self-pay | Admitting: Family Medicine

## 2018-11-15 ENCOUNTER — Ambulatory Visit (INDEPENDENT_AMBULATORY_CARE_PROVIDER_SITE_OTHER): Payer: Medicare Other | Admitting: Family Medicine

## 2018-11-15 VITALS — BP 112/64 | HR 64 | Temp 98.4°F | Resp 16 | Ht 60.0 in | Wt 162.0 lb

## 2018-11-15 DIAGNOSIS — R079 Chest pain, unspecified: Secondary | ICD-10-CM

## 2018-11-15 NOTE — Progress Notes (Signed)
Subjective:    Patient ID: Jackie Lynch, female    DOB: 08-20-1940, 78 y.o.   MRN: [id number]  HPI Patient reports a 3-day history of chest pain.  Pain occurs sporadically.  It is a sharp pain that begins right at the xiphoid process.  It radiates up her sternum.  It lasts a brief second.  Sharp and intense and almost electrical-like.  It is made worse by movement.  It hurts to take a deep breath in.  It hurts to cough.  I can reproduce the pain by palpation over the distal sternum and xiphoid process.  She is also tender to palpation along the lower left ribs radiating into her back.  In mid April, the patient had severe bronchitis and was having significant coughing spells and pleurisy.  This is gradually gotten better however last week she was shoveling mulch and she believes she may have triggered the pain then.  She denies any angina.  She denies any shortness of breath.  She denies any nausea or vomiting.  She denies any left arm pain.  She denies any exacerbation of the pain with food.  She denies any hemoptysis.  She denies any fevers or chills.  Coughing has resolved.  Again pain is reproduced with palpation on the distal sternum.  EKG today shows normal sinus rhythm with normal intervals and a normal axis with no evidence of ischemia or infarction.  There is no significant change compared to EKG of 2014 Past Medical History:  Diagnosis Date  . Anemia   . Arthritis   . Constipation   . Diverticulosis of colon   . 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 GERD (gastroesophageal reflux disease)   . H/O osteopenia   . Hyperlipidemia   . Hypertension   . Hyperthyroidism   . Nocturia   . PONV (postoperative nausea and vomiting)   . Renal cyst, congenital, right    Past Surgical History:  Procedure Laterality Date  . ABDOMINAL HYSTERECTOMY    . CHOLECYSTECTOMY    . JOINT REPLACEMENT    . KNEE ARTHROSCOPY     right and left  . TOTAL HIP ARTHROPLASTY  11/14/2011    Procedure: TOTAL HIP ARTHROPLASTY;  Surgeon: Alta Corning, MD;  Location: [place];  Service: Orthopedics;  Laterality: Left;  . TOTAL KNEE ARTHROPLASTY Right 08/31/2012   Procedure: TOTAL KNEE ARTHROPLASTY;  Surgeon: Alta Corning, MD;  Location: [place];  Service: Orthopedics;  Laterality: Right;  . TUBAL LIGATION     Current Outpatient Medications on File Prior to Visit  Medication Sig Dispense Refill  . albuterol (PROVENTIL HFA;VENTOLIN HFA) 108 (90 Base) MCG/ACT inhaler Inhale 2 puffs into the lungs every 6 (six) hours as needed for wheezing. 1 Inhaler 0  . azithromycin (ZITHROMAX) 250 MG tablet Take 5 tablets (1,250 mg total) by mouth daily. 6 each 0  . CALCIUM-VITAMIN D PO Take 1 tablet by mouth daily. Reported on 09/30/2015    . Cholecalciferol (HM VITAMIN D3) 4000 units CAPS Take 4000iu daily. Continue Calcium with Vit D. 30 capsule   . cloNIDine (CATAPRES) 0.1 MG tablet TAKE 1 TABLET BY MOUTH 3  TIMES A DAY FOR ELEVATED  BLOOD PRESSURE 270 tablet 3  . diazepam (VALIUM) 5 MG tablet Take 1 tablet (5 mg total) by mouth every 8 (eight) hours as needed for anxiety. 30 tablet 1  . docusate sodium (COLACE) 100 MG capsule Take 100 mg by mouth daily.    Marland Kitchen  ergocalciferol (DRISDOL) 8000 UNIT/ML drops Take 1 mL (8,000 Units total) by mouth daily. 60 mL 2  . fenofibrate 160 MG tablet TAKE 1 TABLET BY MOUTH  EVERY DAY 90 tablet 1  . gabapentin (NEURONTIN) 100 MG capsule Take 1 capsule (100 mg total) by mouth 3 (three) times daily. 90 capsule 3  . hydrochlorothiazide (HYDRODIURIL) 25 MG tablet TAKE 1 TABLET BY MOUTH  EVERY DAY 90 tablet 1  . HYDROcodone-acetaminophen (NORCO) 5-325 MG tablet Take 1 tablet by mouth every 6 (six) hours as needed for moderate pain. 30 tablet 0  . levocetirizine (XYZAL) 5 MG tablet Take 1 tablet (5 mg total) by mouth every evening. 30 tablet 2  . linaclotide (LINZESS) 290 MCG CAPS capsule Take 1 capsule (290 mcg total) by mouth daily before breakfast. 90 capsule 3  . losartan  (COZAAR) 100 MG tablet TAKE 1 TABLET BY MOUTH  EVERY DAY 90 tablet 1  . methimazole (TAPAZOLE) 10 MG tablet Take 10 mg by mouth 3 (three) times a week.     . metoprolol tartrate (LOPRESSOR) 50 MG tablet TAKE 1 TABLET BY MOUTH  TWICE A DAY 180 tablet 1  . neomycin-polymyxin-hydrocortisone (CORTISPORIN) otic solution Place 3 drops into the left ear 4 (four) times daily. For 5 days 10 mL 0  . pantoprazole (PROTONIX) 40 MG tablet Take 1 tablet (40 mg total) by mouth 2 (two) times daily. 180 tablet 3  . pravastatin (PRAVACHOL) 80 MG tablet TAKE 1 TABLET BY MOUTH  EVERY DAY 90 tablet 1  . Probiotic Product (PROBIOTIC FORMULA PO) Take 1 tablet by mouth daily.     No current facility-administered medications on file prior to visit.    Allergies  Allergen Reactions  . Bee Venom Swelling   Social History   Socioeconomic History  . Marital status: Married    Spouse name: Not on file  . Number of children: Not on file  . Years of education: Not on file  . Highest education level: Not on file  Occupational History  . Not on file  Social Needs  . Financial resource strain: Not on file  . Food insecurity:    Worry: Not on file    Inability: Not on file  . Transportation needs:    Medical: Not on file    Non-medical: Not on file  Tobacco Use  . Smoking status: Never Smoker  . Smokeless tobacco: Never Used  Substance and Sexual Activity  . Alcohol use: No  . Drug use: No  . Sexual activity: Never  Lifestyle  . Physical activity:    Days per week: Not on file    Minutes per session: Not on file  . Stress: Not on file  Relationships  . Social connections:    Talks on phone: Not on file    Gets together: Not on file    Attends religious service: Not on file    Active member of club or organization: Not on file    Attends meetings of clubs or organizations: Not on file    Relationship status: Not on file  . Intimate partner violence:    Fear of current or ex partner: Not on file     Emotionally abused: Not on file    Physically abused: Not on file    Forced sexual activity: Not on file  Other Topics Concern  . Not on file  Social History Narrative  . Not on file      Review of Systems  All  other systems reviewed and are negative.      Objective:   Physical Exam Vitals signs reviewed. Exam conducted with a chaperone present.  Constitutional:      General: She is not in acute distress.    Appearance: Normal appearance. She is well-developed. She is not ill-appearing, toxic-appearing or diaphoretic.  HENT:     Left Ear: External ear normal.     Nose: Nose normal.     Mouth/Throat:     Pharynx: No oropharyngeal exudate.  Eyes:     Conjunctiva/sclera: Conjunctivae normal.     Pupils: Pupils are equal, round, and reactive to light.  Neck:     Musculoskeletal: Normal range of motion and neck supple.  Cardiovascular:     Rate and Rhythm: Normal rate and regular rhythm.     Pulses: Normal pulses.     Heart sounds: Normal heart sounds. No murmur. No friction rub. No gallop.   Pulmonary:     Effort: Pulmonary effort is normal. No respiratory distress.     Breath sounds: No stridor. No wheezing or rhonchi.    Chest:     Chest wall: Tenderness present.    Abdominal:     General: Abdomen is flat. Bowel sounds are normal. There is no distension.     Palpations: Abdomen is soft. There is no mass.     Tenderness: There is no abdominal tenderness. There is no guarding or rebound.  Musculoskeletal:     Right lower leg: No edema.     Left lower leg: No edema.  Lymphadenopathy:     Cervical: No cervical adenopathy.  Neurological:     Mental Status: She is alert.           Assessment & Plan:  Chest pain, unspecified type - Plan: EKG 12-Lead I believe the patient has chest wall pain likely due to a torn intercostal muscle from coughing or lifting.  I cannot exclude out a rib fracture.  However I do believe the pain is musculoskeletal in nature and not  due to an underlying cardiac source.  There were no abnormalities palpated in the left breast when I examined the patient with a chaperone present.  There was no evidence of shingles or rash however she is tender to palpation all along the red line in the diagram.  If the pain worsens or she develops a pressure-like sensation in the chest that will not improve I want her to go to the emergency room.  Otherwise I anticipate that this will gradually improve over the next 1 to 2 weeks as the injury heals.  I recommended no heavy lifting and no strenuous activity

## 2018-11-19 ENCOUNTER — Other Ambulatory Visit: Payer: Self-pay | Admitting: Family Medicine

## 2018-11-22 DIAGNOSIS — E0590 Thyrotoxicosis, unspecified without thyrotoxic crisis or storm: Secondary | ICD-10-CM | POA: Diagnosis not present

## 2018-11-29 DIAGNOSIS — E0590 Thyrotoxicosis, unspecified without thyrotoxic crisis or storm: Secondary | ICD-10-CM | POA: Diagnosis not present

## 2018-11-29 DIAGNOSIS — I10 Essential (primary) hypertension: Secondary | ICD-10-CM | POA: Diagnosis not present

## 2019-01-15 DIAGNOSIS — L720 Epidermal cyst: Secondary | ICD-10-CM | POA: Diagnosis not present

## 2019-01-15 DIAGNOSIS — L814 Other melanin hyperpigmentation: Secondary | ICD-10-CM | POA: Diagnosis not present

## 2019-01-15 DIAGNOSIS — L84 Corns and callosities: Secondary | ICD-10-CM | POA: Diagnosis not present

## 2019-01-15 DIAGNOSIS — L821 Other seborrheic keratosis: Secondary | ICD-10-CM | POA: Diagnosis not present

## 2019-02-28 DIAGNOSIS — E0590 Thyrotoxicosis, unspecified without thyrotoxic crisis or storm: Secondary | ICD-10-CM | POA: Diagnosis not present

## 2019-03-07 ENCOUNTER — Other Ambulatory Visit: Payer: Self-pay | Admitting: Family Medicine

## 2019-03-18 ENCOUNTER — Other Ambulatory Visit: Payer: Self-pay | Admitting: Family Medicine

## 2019-03-19 ENCOUNTER — Other Ambulatory Visit: Payer: Self-pay

## 2019-03-19 ENCOUNTER — Ambulatory Visit (INDEPENDENT_AMBULATORY_CARE_PROVIDER_SITE_OTHER): Payer: Medicare Other

## 2019-03-19 DIAGNOSIS — Z23 Encounter for immunization: Secondary | ICD-10-CM

## 2019-03-19 NOTE — Progress Notes (Signed)
Patient came in today to receive annual flu vaccine. Patient received fluad in the left deltoid. She tolerated well. VIS given

## 2019-04-02 DIAGNOSIS — Z803 Family history of malignant neoplasm of breast: Secondary | ICD-10-CM | POA: Diagnosis not present

## 2019-04-02 DIAGNOSIS — Z1231 Encounter for screening mammogram for malignant neoplasm of breast: Secondary | ICD-10-CM | POA: Diagnosis not present

## 2019-04-02 LAB — HM MAMMOGRAPHY

## 2019-05-04 ENCOUNTER — Other Ambulatory Visit: Payer: Self-pay | Admitting: Family Medicine

## 2019-05-09 DIAGNOSIS — K5900 Constipation, unspecified: Secondary | ICD-10-CM | POA: Diagnosis not present

## 2019-05-09 DIAGNOSIS — R140 Abdominal distension (gaseous): Secondary | ICD-10-CM | POA: Diagnosis not present

## 2019-05-09 DIAGNOSIS — K625 Hemorrhage of anus and rectum: Secondary | ICD-10-CM | POA: Diagnosis not present

## 2019-05-09 DIAGNOSIS — K601 Chronic anal fissure: Secondary | ICD-10-CM | POA: Diagnosis not present

## 2019-05-20 DIAGNOSIS — E0590 Thyrotoxicosis, unspecified without thyrotoxic crisis or storm: Secondary | ICD-10-CM | POA: Diagnosis not present

## 2019-05-27 ENCOUNTER — Other Ambulatory Visit: Payer: Self-pay | Admitting: Family Medicine

## 2019-05-27 DIAGNOSIS — I10 Essential (primary) hypertension: Secondary | ICD-10-CM | POA: Diagnosis not present

## 2019-05-27 DIAGNOSIS — E0590 Thyrotoxicosis, unspecified without thyrotoxic crisis or storm: Secondary | ICD-10-CM | POA: Diagnosis not present

## 2019-08-16 ENCOUNTER — Other Ambulatory Visit: Payer: Self-pay | Admitting: Family Medicine

## 2019-09-13 DIAGNOSIS — Z23 Encounter for immunization: Secondary | ICD-10-CM | POA: Diagnosis not present

## 2019-11-05 ENCOUNTER — Other Ambulatory Visit: Payer: Self-pay | Admitting: Family Medicine

## 2020-01-27 ENCOUNTER — Other Ambulatory Visit: Payer: Self-pay | Admitting: Family Medicine

## 2020-01-28 NOTE — Telephone Encounter (Signed)
Last Ov 11/15/18 Last refill Lopressor 03/08/19 Last refill Losartan 03/19/19 Ok to refill?

## 2020-02-10 ENCOUNTER — Other Ambulatory Visit: Payer: Self-pay

## 2020-02-10 ENCOUNTER — Encounter: Payer: Self-pay | Admitting: Family Medicine

## 2020-02-10 ENCOUNTER — Ambulatory Visit (INDEPENDENT_AMBULATORY_CARE_PROVIDER_SITE_OTHER): Payer: Medicare Other | Admitting: Family Medicine

## 2020-02-10 VITALS — BP 144/80 | HR 77 | Temp 97.3°F | Ht 60.0 in | Wt 152.0 lb

## 2020-02-10 DIAGNOSIS — E119 Type 2 diabetes mellitus without complications: Secondary | ICD-10-CM

## 2020-02-10 DIAGNOSIS — K047 Periapical abscess without sinus: Secondary | ICD-10-CM

## 2020-02-10 DIAGNOSIS — E0590 Thyrotoxicosis, unspecified without thyrotoxic crisis or storm: Secondary | ICD-10-CM | POA: Diagnosis not present

## 2020-02-10 DIAGNOSIS — M109 Gout, unspecified: Secondary | ICD-10-CM

## 2020-02-10 MED ORDER — AMOXICILLIN 875 MG PO TABS: 875.0000 mg | ORAL_TABLET | Freq: Two times a day (BID) | ORAL | 0 refills | Status: DC

## 2020-02-10 MED ORDER — PREDNISONE 20 MG PO TABS
ORAL_TABLET | ORAL | 0 refills | Status: DC
Start: 2020-02-10 — End: 2020-03-13

## 2020-02-10 NOTE — Progress Notes (Signed)
Subjective:    Patient ID: Jackie Lynch, female    DOB: 1940-12-07, 79 y.o.   MRN: [id number]  HPI Patient has a history of gout per her report.  She recently had pain in her left foot at the first MTP joint.  It is still extremely sore and swollen on the plantar aspect.  There is no erythema or warmth.  She is unable to take NSAIDs due to history of gastritis along with chronic kidney disease.  She has now developed some pain and swelling over the dorsal midfoot.  She denies any falls or injuries.  Pain began suddenly and insidiously without reason and has now persisted for more than a week.  She also complains of pain in her left ear however on exam the tympanic membrane appears completely healthy and the canal is normal.  When I asked her to pinpoint where she feels the pain she actually points to her lower left mandible.  On examination when I palpate the jaw and the adjacent tooth she winces in pain.  I believe that she is developing a dental abscess rather than any otalgia.  She states that she is seen the dentist and they have recommended "$7000" with a dental work."  She is long overdue for fasting lab work. Past Medical History:  Diagnosis Date  . Anemia   . Arthritis   . Constipation   . Diverticulosis of colon   . 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 GERD (gastroesophageal reflux disease)   . H/O osteopenia   . Hyperlipidemia   . Hypertension   . Hyperthyroidism   . Nocturia   . PONV (postoperative nausea and vomiting)   . Renal cyst, congenital, right    Past Surgical History:  Procedure Laterality Date  . ABDOMINAL HYSTERECTOMY    . CHOLECYSTECTOMY    . JOINT REPLACEMENT    . KNEE ARTHROSCOPY     right and left  . TOTAL HIP ARTHROPLASTY  11/14/2011   Procedure: TOTAL HIP ARTHROPLASTY;  Surgeon: Alta Corning, MD;  Location: [place];  Service: Orthopedics;  Laterality: Left;  . TOTAL KNEE ARTHROPLASTY Right 08/31/2012   Procedure: TOTAL KNEE  ARTHROPLASTY;  Surgeon: Alta Corning, MD;  Location: [place];  Service: Orthopedics;  Laterality: Right;  . TUBAL LIGATION     Current Outpatient Medications on File Prior to Visit  Medication Sig Dispense Refill  . albuterol (PROVENTIL HFA;VENTOLIN HFA) 108 (90 Base) MCG/ACT inhaler Inhale 2 puffs into the lungs every 6 (six) hours as needed for wheezing. 1 Inhaler 0  . azithromycin (ZITHROMAX) 250 MG tablet Take 5 tablets (1,250 mg total) by mouth daily. 6 each 0  . CALCIUM-VITAMIN D PO Take 1 tablet by mouth daily. Reported on 09/30/2015    . Cholecalciferol (HM VITAMIN D3) 4000 units CAPS Take 4000iu daily. Continue Calcium with Vit D. 30 capsule   . cloNIDine (CATAPRES) 0.1 MG tablet TAKE 1 TABLET BY MOUTH 3  TIMES A DAY FOR ELEVATED  BLOOD PRESSURE 270 tablet 3  . diazepam (VALIUM) 5 MG tablet Take 1 tablet (5 mg total) by mouth every 8 (eight) hours as needed for anxiety. 30 tablet 1  . docusate sodium (COLACE) 100 MG capsule Take 100 mg by mouth daily.    . ergocalciferol (DRISDOL) 8000 UNIT/ML drops Take 1 mL (8,000 Units total) by mouth daily. 60 mL 2  . fenofibrate 160 MG tablet TAKE 1 TABLET BY MOUTH  DAILY 90 tablet 3  .  gabapentin (NEURONTIN) 100 MG capsule Take 1 capsule (100 mg total) by mouth 3 (three) times daily. 90 capsule 3  . hydrochlorothiazide (HYDRODIURIL) 25 MG tablet TAKE 1 TABLET BY MOUTH  DAILY 90 tablet 3  . HYDROcodone-acetaminophen (NORCO) 5-325 MG tablet Take 1 tablet by mouth every 6 (six) hours as needed for moderate pain. 30 tablet 0  . levocetirizine (XYZAL) 5 MG tablet Take 1 tablet (5 mg total) by mouth every evening. 30 tablet 2  . linaclotide (LINZESS) 290 MCG CAPS capsule Take 1 capsule (290 mcg total) by mouth daily before breakfast. 90 capsule 3  . losartan (COZAAR) 100 MG tablet TAKE 1 TABLET BY MOUTH  DAILY 90 tablet 0  . methimazole (TAPAZOLE) 10 MG tablet Take 10 mg by mouth 3 (three) times a week.     . metoprolol tartrate (LOPRESSOR) 50 MG tablet  TAKE 1 TABLET BY MOUTH  TWICE DAILY 180 tablet 0  . neomycin-polymyxin-hydrocortisone (CORTISPORIN) otic solution Place 3 drops into the left ear 4 (four) times daily. For 5 days 10 mL 0  . pantoprazole (PROTONIX) 40 MG tablet TAKE 1 TABLET BY MOUTH  TWICE DAILY 180 tablet 3  . pravastatin (PRAVACHOL) 80 MG tablet TAKE 1 TABLET BY MOUTH  DAILY 90 tablet 3  . Probiotic Product (PROBIOTIC FORMULA PO) Take 1 tablet by mouth daily.     No current facility-administered medications on file prior to visit.   Allergies  Allergen Reactions  . Bee Venom Swelling   Social History   Socioeconomic History  . Marital status: Married    Spouse name: Not on file  . Number of children: Not on file  . Years of education: Not on file  . Highest education level: Not on file  Occupational History  . Not on file  Tobacco Use  . Smoking status: Never Smoker  . Smokeless tobacco: Never Used  Substance and Sexual Activity  . Alcohol use: No  . Drug use: No  . Sexual activity: Never  Other Topics Concern  . Not on file  Social History Narrative  . Not on file   Social Determinants of Health   Financial Resource Strain:   . Difficulty of Paying Living Expenses:   Food Insecurity:   . Worried About Charity fundraiser in the Last Year:   . Arboriculturist in the Last Year:   Transportation Needs:   . Film/video editor (Medical):   Marland Kitchen Lack of Transportation (Non-Medical):   Physical Activity:   . Days of Exercise per Week:   . Minutes of Exercise per Session:   Stress:   . Feeling of Stress :   Social Connections:   . Frequency of Communication with Friends and Family:   . Frequency of Social Gatherings with Friends and Family:   . Attends Religious Services:   . Active Member of Clubs or Organizations:   . Attends Archivist Meetings:   Marland Kitchen Marital Status:   Intimate Partner Violence:   . Fear of Current or Ex-Partner:   . Emotionally Abused:   Marland Kitchen Physically Abused:   .  Sexually Abused:       Review of Systems  All other systems reviewed and are negative.      Objective:   Physical Exam Vitals reviewed. Exam conducted with a chaperone present.  Constitutional:      General: She is not in acute distress.    Appearance: Normal appearance. She is well-developed. She is not ill-appearing,  toxic-appearing or diaphoretic.  HENT:     Mouth/Throat:     Dentition: Dental tenderness, gingival swelling and dental abscesses present.   Cardiovascular:     Rate and Rhythm: Normal rate and regular rhythm.     Pulses: Normal pulses.     Heart sounds: Normal heart sounds. No murmur heard.  No friction rub. No gallop.   Pulmonary:     Effort: Pulmonary effort is normal. No respiratory distress.  Abdominal:     General: Abdomen is flat. Bowel sounds are normal. There is no distension.     Palpations: Abdomen is soft. There is no mass.     Tenderness: There is no abdominal tenderness. There is no guarding or rebound.  Musculoskeletal:     Cervical back: Neck supple.     Right lower leg: No edema.     Left lower leg: No deformity. No edema.     Left foot: Swelling, tenderness and bony tenderness present.       Feet:  Lymphadenopathy:     Cervical: No cervical adenopathy.  Neurological:     Mental Status: She is alert.           Assessment & Plan:  Controlled type 2 diabetes mellitus without complication, without long-term current use of insulin (HCC) - Plan: Hemoglobin A1c, Lipid panel, COMPLETE METABOLIC PANEL WITH GFR, CBC with Differential/Platelet, Microalbumin, urine  Hyperthyroidism - Plan: TSH  Exacerbation of gout  Dental infection  Treat the dental infection with amoxicillin 875 mg twice daily for 10 days.  Recommended she contact her dentist to definitively deal with the situation.  Treat the gout exacerbation with a prednisone taper pack.  She has not had any lab work to monitor her hypothyroidism or diabetes in almost a year.   Therefore I will check an A1c, CMP, fasting lipid panel, and a TSH.  Spent more than 20 minutes today with the patient discussing her medical situation

## 2020-02-11 LAB — CBC WITH DIFFERENTIAL/PLATELET
Absolute Monocytes: 490 cells/uL (ref 200–950)
Basophils Absolute: 62 cells/uL (ref 0–200)
Basophils Relative: 1 %
Eosinophils Absolute: 285 cells/uL (ref 15–500)
Eosinophils Relative: 4.6 %
HCT: 37.2 % (ref 35.0–45.0)
Hemoglobin: 12 g/dL (ref 11.7–15.5)
Lymphs Abs: 2523 cells/uL (ref 850–3900)
MCH: 30.2 pg (ref 27.0–33.0)
MCHC: 32.3 g/dL (ref 32.0–36.0)
MCV: 93.5 fL (ref 80.0–100.0)
MPV: 13.4 fL — ABNORMAL HIGH (ref 7.5–12.5)
Monocytes Relative: 7.9 %
Neutro Abs: 2840 cells/uL (ref 1500–7800)
Neutrophils Relative %: 45.8 %
Platelets: 169 10*3/uL (ref 140–400)
RBC: 3.98 10*6/uL (ref 3.80–5.10)
RDW: 12.7 % (ref 11.0–15.0)
Total Lymphocyte: 40.7 %
WBC: 6.2 10*3/uL (ref 3.8–10.8)

## 2020-02-11 LAB — COMPLETE METABOLIC PANEL WITH GFR
AG Ratio: 1.9 (calc) (ref 1.0–2.5)
ALT: 15 U/L (ref 6–29)
AST: 25 U/L (ref 10–35)
Albumin: 4.3 g/dL (ref 3.6–5.1)
Alkaline phosphatase (APISO): 30 U/L — ABNORMAL LOW (ref 37–153)
BUN/Creatinine Ratio: 18 (calc) (ref 6–22)
BUN: 17 mg/dL (ref 7–25)
CO2: 28 mmol/L (ref 20–32)
Calcium: 9.8 mg/dL (ref 8.6–10.4)
Chloride: 100 mmol/L (ref 98–110)
Creat: 0.94 mg/dL — ABNORMAL HIGH (ref 0.60–0.93)
GFR, Est African American: 67 mL/min/{1.73_m2} (ref >=60)
GFR, Est Non African American: 58 mL/min/{1.73_m2} — ABNORMAL LOW (ref >=60)
Globulin: 2.3 g/dL (calc) (ref 1.9–3.7)
Glucose, Bld: 94 mg/dL (ref 65–99)
Potassium: 3.8 mmol/L (ref 3.5–5.3)
Sodium: 137 mmol/L (ref 135–146)
Total Bilirubin: 0.7 mg/dL (ref 0.2–1.2)
Total Protein: 6.6 g/dL (ref 6.1–8.1)

## 2020-02-11 LAB — LIPID PANEL
Cholesterol: 165 mg/dL (ref <200)
HDL: 58 mg/dL (ref >=50)
LDL Cholesterol (Calc): 93 mg/dL (calc)
Non-HDL Cholesterol (Calc): 107 mg/dL (calc) (ref <130)
Total CHOL/HDL Ratio: 2.8 (calc) (ref <5.0)
Triglycerides: 58 mg/dL (ref <150)

## 2020-02-11 LAB — HEMOGLOBIN A1C
Hgb A1c MFr Bld: 5.9 % of total Hgb — ABNORMAL HIGH (ref <5.7)
Mean Plasma Glucose: 123 (calc)
eAG (mmol/L): 6.8 (calc)

## 2020-02-11 LAB — TSH: TSH: 2.91 mIU/L (ref 0.40–4.50)

## 2020-02-11 LAB — MICROALBUMIN, URINE: Microalb, Ur: 2.6 mg/dL

## 2020-03-05 ENCOUNTER — Telehealth: Payer: Self-pay | Admitting: Family Medicine

## 2020-03-05 NOTE — Progress Notes (Signed)
  Chronic Care Management   Outreach Note  03/05/2020 Name: JASDEEP DEJARNETT MRN: [id number] DOB: 1940-09-25  Referred by: Susy Frizzle, MD Reason for referral : No chief complaint on file.   An unsuccessful telephone outreach was attempted today. The patient was referred to the pharmacist for assistance with care management and care coordination.   Follow Up Plan:   Carley Perdue [employer] Scheduler

## 2020-03-12 ENCOUNTER — Telehealth: Payer: Self-pay | Admitting: Family Medicine

## 2020-03-12 NOTE — Progress Notes (Signed)
  Chronic Care Management   Note  03/12/2020 Name: Jackie Lynch MRN: [id number] DOB: [birth year]  Jackie Lynch is a 79 y.o. year old female who is a primary care patient of Pickard, Cammie Mcgee, MD. I reached out to Jackie Lynch by phone today in response to a referral sent by Ms. Amazing G Brasil's PCP, Susy Frizzle, MD.   [name] was given information about Chronic Care Management services today including:  1. CCM service includes personalized support from designated clinical staff supervised by her physician, including individualized plan of care and coordination with other care providers 2. 24/7 contact phone numbers for assistance for urgent and routine care needs. 3. Service will only be billed when office clinical staff spend 20 minutes or more in a month to coordinate care. 4. Only one practitioner may furnish and bill the service in a calendar month. 5. The patient may stop CCM services at any time (effective at the end of the month) by phone call to the office staff.   Patient agreed to services and verbal consent obtained.   Follow up plan:   Carley Perdue [employer] Scheduler

## 2020-03-13 ENCOUNTER — Ambulatory Visit (INDEPENDENT_AMBULATORY_CARE_PROVIDER_SITE_OTHER): Payer: Medicare Other | Admitting: Family Medicine

## 2020-03-13 ENCOUNTER — Other Ambulatory Visit: Payer: Self-pay

## 2020-03-13 VITALS — BP 130/72 | HR 64 | Temp 97.7°F | Ht 60.0 in | Wt 153.0 lb

## 2020-03-13 DIAGNOSIS — E119 Type 2 diabetes mellitus without complications: Secondary | ICD-10-CM

## 2020-03-13 DIAGNOSIS — E7800 Pure hypercholesterolemia, unspecified: Secondary | ICD-10-CM | POA: Diagnosis not present

## 2020-03-13 DIAGNOSIS — Z23 Encounter for immunization: Secondary | ICD-10-CM | POA: Diagnosis not present

## 2020-03-13 DIAGNOSIS — E0590 Thyrotoxicosis, unspecified without thyrotoxic crisis or storm: Secondary | ICD-10-CM

## 2020-03-13 MED ORDER — DIAZEPAM 5 MG PO TABS: 5.0000 mg | ORAL_TABLET | Freq: Three times a day (TID) | ORAL | 0 refills | Status: DC | PRN

## 2020-03-13 NOTE — Progress Notes (Signed)
Subjective:    Patient ID: Jackie Lynch, female    DOB: 11/10/1940, 79 y.o.   MRN: [id number]  HPI  Patient is a very sweet 79 year old Caucasian female who is here today for follow-up of her chronic medical conditions.  She has a history of type 2 diabetes mellitus.  On her most recent lab work, her hemoglobin A1c was outstanding at 5.9.  She denies any hypoglycemic episodes.  She denies any neuropathy in her feet.  She denies any chest pain or shortness of breath.  Her blood pressure today is well controlled at 130/72.  She also has a history of hypothyroidism.  On her most recent lab work her TSH was well within normal limits.  She is currently on suppression with methimazole.  She denies any tachycardia.  She denies any palpitations.  She denies any fatigue.  Unfortunately, her husband's dementia is worsening.  Hospice is now involved.  She is under tremendous stress.  She is requesting a refill on her Valium.  The last prescription I gave her for 30 pills lasted more than 5 years.  She uses the medication extremely sparingly only when she feels overwhelmed.  She would like a refill that she could use.  She states the last pills that she took did not cause any dizziness or falls.  We discussed the risk of falls and dependency and she states that she does not use the medication excessively which is clear from her prescribing record.  She also denies feeling dizzy on the medication. Office Visit on 02/10/2020  Component Date Value Ref Range Status  . Hgb A1c MFr Bld 02/10/2020 5.9* <5.7 % of total Hgb Final   Comment: For someone without known diabetes, a hemoglobin  A1c value between 5.7% and 6.4% is consistent with prediabetes and should be confirmed with a  follow-up test. . For someone with known diabetes, a value <7% indicates that their diabetes is well controlled. A1c targets should be individualized based on duration of diabetes, age, comorbid conditions, and  other considerations. . This assay result is consistent with an increased risk of diabetes. . Currently, no consensus exists regarding use of hemoglobin A1c for diagnosis of diabetes for children. .   . Mean Plasma Glucose 02/10/2020 123  (calc) Final  . eAG (mmol/L) 02/10/2020 6.8  (calc) Final  . Cholesterol 02/10/2020 165  <200 mg/dL Final  . HDL 02/10/2020 58  > OR = 50 mg/dL Final  . Triglycerides 02/10/2020 58  <150 mg/dL Final  . LDL Cholesterol (Calc) 02/10/2020 93  mg/dL (calc) Final   Comment: Reference range: <100 . Desirable range <100 mg/dL for primary prevention;   <70 mg/dL for patients with CHD or diabetic patients  with > or = 2 CHD risk factors. Marland Kitchen LDL-C is now calculated using the Martin-Hopkins  calculation, which is a validated novel method providing  better accuracy than the Friedewald equation in the  estimation of LDL-C.  Cresenciano Genre et al. Annamaria Helling. 0092;330(07): 2061-2068  (http://education.[facility].com/faq/FAQ164)   . Total CHOL/HDL Ratio 02/10/2020 2.8  <5.0 (calc) Final  . Non-HDL Cholesterol (Calc) 02/10/2020 107  <130 mg/dL (calc) Final   Comment: For patients with diabetes plus 1 major ASCVD risk  factor, treating to a non-HDL-C goal of <100 mg/dL  (LDL-C of <70 mg/dL) is considered a therapeutic  option.   . Glucose, Bld 02/10/2020 94  65 - 99 mg/dL Final   Comment: .  Fasting reference interval .   . BUN 02/10/2020 17  7 - 25 mg/dL Final  . Creat 02/10/2020 0.94* 0.60 - 0.93 mg/dL Final   Comment: For patients >75 years of age, the reference limit for Creatinine is approximately 13% higher for people identified as African-American. .   . GFR, Est Non African American 02/10/2020 58* > OR = 60 mL/min/1.27m2 Final  . GFR, Est African American 02/10/2020 67  > OR = 60 mL/min/1.73m2 Final  . BUN/Creatinine Ratio 02/10/2020 18  6 - 22 (calc) Final  . Sodium 02/10/2020 137  135 - 146 mmol/L Final  . Potassium 02/10/2020 3.8   3.5 - 5.3 mmol/L Final  . Chloride 02/10/2020 100  98 - 110 mmol/L Final  . CO2 02/10/2020 28  20 - 32 mmol/L Final  . Calcium 02/10/2020 9.8  8.6 - 10.4 mg/dL Final  . Total Protein 02/10/2020 6.6  6.1 - 8.1 g/dL Final  . Albumin 02/10/2020 4.3  3.6 - 5.1 g/dL Final  . Globulin 02/10/2020 2.3  1.9 - 3.7 g/dL (calc) Final  . AG Ratio 02/10/2020 1.9  1.0 - 2.5 (calc) Final  . Total Bilirubin 02/10/2020 0.7  0.2 - 1.2 mg/dL Final  . Alkaline phosphatase (APISO) 02/10/2020 30* 37 - 153 U/L Final  . AST 02/10/2020 25  10 - 35 U/L Final  . ALT 02/10/2020 15  6 - 29 U/L Final  . WBC 02/10/2020 6.2  3.8 - 10.8 Thousand/uL Final  . RBC 02/10/2020 3.98  3.80 - 5.10 Million/uL Final  . Hemoglobin 02/10/2020 12.0  11.7 - 15.5 g/dL Final  . HCT 02/10/2020 37.2  35 - 45 % Final  . MCV 02/10/2020 93.5  80.0 - 100.0 fL Final  . MCH 02/10/2020 30.2  27.0 - 33.0 pg Final  . MCHC 02/10/2020 32.3  32.0 - 36.0 g/dL Final  . RDW 02/10/2020 12.7  11.0 - 15.0 % Final  . Platelets 02/10/2020 169  140 - 400 Thousand/uL Final  . MPV 02/10/2020 13.4* 7.5 - 12.5 fL Final  . Neutro Abs 02/10/2020 2,840  1,500 - 7,800 cells/uL Final  . Lymphs Abs 02/10/2020 2,523  850 - 3,900 cells/uL Final  . Absolute Monocytes 02/10/2020 490  200 - 950 cells/uL Final  . Eosinophils Absolute 02/10/2020 285  15 - 500 cells/uL Final  . Basophils Absolute 02/10/2020 62  0 - 200 cells/uL Final  . Neutrophils Relative % 02/10/2020 45.8  % Final  . Total Lymphocyte 02/10/2020 40.7  % Final  . Monocytes Relative 02/10/2020 7.9  % Final  . Eosinophils Relative 02/10/2020 4.6  % Final  . Basophils Relative 02/10/2020 1.0  % Final  . TSH 02/10/2020 2.91  0.40 - 4.50 mIU/L Final  . Microalb, Ur 02/10/2020 2.6  mg/dL Final   Comment: Reference Range Not established   . RAM 02/10/2020    Final   Comment: . The ADA defines abnormalities in albumin excretion as follows: Marland Kitchen Category         Result (mcg/mg creatinine) . Normal                     <30 Microalbuminuria         30-299  Clinical albuminuria   > OR = 300 . The ADA recommends that at least two of three specimens collected within a 3-6 month period be abnormal before considering a patient to be within a diagnostic category.     Past Medical History:  Diagnosis Date  .  Anemia   . Arthritis   . Constipation   . Diverticulosis of colon   . 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 GERD (gastroesophageal reflux disease)   . H/O osteopenia   . Hyperlipidemia   . Hypertension   . Hyperthyroidism   . Nocturia   . PONV (postoperative nausea and vomiting)   . Renal cyst, congenital, right    Past Surgical History:  Procedure Laterality Date  . ABDOMINAL HYSTERECTOMY    . CHOLECYSTECTOMY    . JOINT REPLACEMENT    . KNEE ARTHROSCOPY     right and left  . TOTAL HIP ARTHROPLASTY  11/14/2011   Procedure: TOTAL HIP ARTHROPLASTY;  Surgeon: Alta Corning, MD;  Location: [place];  Service: Orthopedics;  Laterality: Left;  . TOTAL KNEE ARTHROPLASTY Right 08/31/2012   Procedure: TOTAL KNEE ARTHROPLASTY;  Surgeon: Alta Corning, MD;  Location: [place];  Service: Orthopedics;  Laterality: Right;  . TUBAL LIGATION     Current Outpatient Medications on File Prior to Visit  Medication Sig Dispense Refill  . amoxicillin (AMOXIL) 875 MG tablet Take 1 tablet (875 mg total) by mouth 2 (two) times daily. 20 tablet 0  . CALCIUM-VITAMIN D PO Take 1 tablet by mouth daily. Reported on 09/30/2015    . Cholecalciferol (HM VITAMIN D3) 4000 units CAPS Take 4000iu daily. Continue Calcium with Vit D. 30 capsule   . cloNIDine (CATAPRES) 0.1 MG tablet TAKE 1 TABLET BY MOUTH 3  TIMES A DAY FOR ELEVATED  BLOOD PRESSURE 270 tablet 3  . diazepam (VALIUM) 5 MG tablet Take 1 tablet (5 mg total) by mouth every 8 (eight) hours as needed for anxiety. 30 tablet 1  . docusate sodium (COLACE) 100 MG capsule Take 100 mg by mouth daily.    . ergocalciferol (DRISDOL) 8000 UNIT/ML drops  Take 1 mL (8,000 Units total) by mouth daily. 60 mL 2  . fenofibrate 160 MG tablet TAKE 1 TABLET BY MOUTH  DAILY 90 tablet 3  . gabapentin (NEURONTIN) 100 MG capsule Take 1 capsule (100 mg total) by mouth 3 (three) times daily. 90 capsule 3  . hydrochlorothiazide (HYDRODIURIL) 25 MG tablet TAKE 1 TABLET BY MOUTH  DAILY 90 tablet 3  . levocetirizine (XYZAL) 5 MG tablet Take 1 tablet (5 mg total) by mouth every evening. 30 tablet 2  . linaclotide (LINZESS) 290 MCG CAPS capsule Take 1 capsule (290 mcg total) by mouth daily before breakfast. (Patient not taking: Reported on 02/10/2020) 90 capsule 3  . losartan (COZAAR) 100 MG tablet TAKE 1 TABLET BY MOUTH  DAILY 90 tablet 0  . methimazole (TAPAZOLE) 10 MG tablet Take 10 mg by mouth 3 (three) times a week.     . metoprolol tartrate (LOPRESSOR) 50 MG tablet TAKE 1 TABLET BY MOUTH  TWICE DAILY 180 tablet 0  . pantoprazole (PROTONIX) 40 MG tablet TAKE 1 TABLET BY MOUTH  TWICE DAILY 180 tablet 3  . pravastatin (PRAVACHOL) 80 MG tablet TAKE 1 TABLET BY MOUTH  DAILY 90 tablet 3  . predniSONE (DELTASONE) 20 MG tablet 3 tabs poqday 1-2, 2 tabs poqday 3-4, 1 tab poqday 5-6 12 tablet 0  . Probiotic Product (PROBIOTIC FORMULA PO) Take 1 tablet by mouth daily.     No current facility-administered medications on file prior to visit.   Allergies  Allergen Reactions  . Bee Venom Swelling   Social History   Socioeconomic History  . Marital status: Married    Spouse name:  Not on file  . Number of children: Not on file  . Years of education: Not on file  . Highest education level: Not on file  Occupational History  . Not on file  Tobacco Use  . Smoking status: Never Smoker  . Smokeless tobacco: Never Used  Substance and Sexual Activity  . Alcohol use: No  . Drug use: No  . Sexual activity: Never  Other Topics Concern  . Not on file  Social History Narrative  . Not on file   Social Determinants of Health   Financial Resource Strain:   . Difficulty  of Paying Living Expenses: Not on file  Food Insecurity:   . Worried About Charity fundraiser in the Last Year: Not on file  . Ran Out of Food in the Last Year: Not on file  Transportation Needs:   . Lack of Transportation (Medical): Not on file  . Lack of Transportation (Non-Medical): Not on file  Physical Activity:   . Days of Exercise per Week: Not on file  . Minutes of Exercise per Session: Not on file  Stress:   . Feeling of Stress : Not on file  Social Connections:   . Frequency of Communication with Friends and Family: Not on file  . Frequency of Social Gatherings with Friends and Family: Not on file  . Attends Religious Services: Not on file  . Active Member of Clubs or Organizations: Not on file  . Attends Archivist Meetings: Not on file  . Marital Status: Not on file  Intimate Partner Violence:   . Fear of Current or Ex-Partner: Not on file  . Emotionally Abused: Not on file  . Physically Abused: Not on file  . Sexually Abused: Not on file      Review of Systems  All other systems reviewed and are negative.      Objective:   Physical Exam Vitals reviewed. Exam conducted with a chaperone present.  Constitutional:      General: She is not in acute distress.    Appearance: Normal appearance. She is well-developed. She is not ill-appearing, toxic-appearing or diaphoretic.  Cardiovascular:     Rate and Rhythm: Normal rate and regular rhythm.     Pulses: Normal pulses.     Heart sounds: Normal heart sounds. No murmur heard.  No friction rub. No gallop.   Pulmonary:     Effort: Pulmonary effort is normal. No respiratory distress.  Abdominal:     General: Abdomen is flat. Bowel sounds are normal. There is no distension.     Palpations: Abdomen is soft. There is no mass.     Tenderness: There is no abdominal tenderness. There is no guarding or rebound.  Musculoskeletal:     Cervical back: Neck supple.     Right lower leg: No edema.     Left lower leg:  No edema.  Lymphadenopathy:     Cervical: No cervical adenopathy.  Neurological:     Mental Status: She is alert.           Assessment & Plan:  Controlled type 2 diabetes mellitus without complication, without long-term current use of insulin ([place])  Pure hypercholesterolemia  Hyperthyroidism  Need for immunization against influenza - Plan: Flu Vaccine QUAD High Dose(Fluad)  Aside from some mild age-related kidney damage, the patient's lab work was excellent.  Her cholesterol was outstanding and her LDL cholesterol was below 100.  Her TSH was within the therapeutic range showing adequate suppression on  methimazole.  Her A1c was outstanding.  Her blood pressure is well controlled.  She received her flu shot today.  Diabetic foot exam was performed and was normal.  I did give her a prescription for Valium but instructed her to use it sparingly and warned her about confusion, sedation, and falls.

## 2020-03-18 ENCOUNTER — Other Ambulatory Visit: Payer: Self-pay | Admitting: Family Medicine

## 2020-04-04 ENCOUNTER — Encounter: Payer: Self-pay | Admitting: Family Medicine

## 2020-04-17 ENCOUNTER — Other Ambulatory Visit: Payer: Self-pay | Admitting: Family Medicine

## 2020-04-27 ENCOUNTER — Ambulatory Visit: Payer: Medicare Other

## 2020-05-19 DIAGNOSIS — E0590 Thyrotoxicosis, unspecified without thyrotoxic crisis or storm: Secondary | ICD-10-CM | POA: Diagnosis not present

## 2020-05-26 DIAGNOSIS — E0590 Thyrotoxicosis, unspecified without thyrotoxic crisis or storm: Secondary | ICD-10-CM | POA: Diagnosis not present

## 2020-05-26 DIAGNOSIS — I10 Essential (primary) hypertension: Secondary | ICD-10-CM | POA: Diagnosis not present

## 2020-07-14 DIAGNOSIS — H524 Presbyopia: Secondary | ICD-10-CM | POA: Diagnosis not present

## 2020-07-14 DIAGNOSIS — Z961 Presence of intraocular lens: Secondary | ICD-10-CM | POA: Diagnosis not present

## 2020-07-14 DIAGNOSIS — H52203 Unspecified astigmatism, bilateral: Secondary | ICD-10-CM | POA: Diagnosis not present

## 2020-07-17 DIAGNOSIS — M542 Cervicalgia: Secondary | ICD-10-CM | POA: Diagnosis not present

## 2020-07-17 DIAGNOSIS — M79672 Pain in left foot: Secondary | ICD-10-CM | POA: Diagnosis not present

## 2020-08-12 DIAGNOSIS — B070 Plantar wart: Secondary | ICD-10-CM | POA: Diagnosis not present

## 2020-08-12 DIAGNOSIS — M7918 Myalgia, other site: Secondary | ICD-10-CM | POA: Diagnosis not present

## 2020-08-12 DIAGNOSIS — M25775 Osteophyte, left foot: Secondary | ICD-10-CM | POA: Diagnosis not present

## 2020-08-12 DIAGNOSIS — S46811A Strain of other muscles, fascia and tendons at shoulder and upper arm level, right arm, initial encounter: Secondary | ICD-10-CM | POA: Diagnosis not present

## 2020-08-12 DIAGNOSIS — G8918 Other acute postprocedural pain: Secondary | ICD-10-CM | POA: Diagnosis not present

## 2020-08-25 DIAGNOSIS — M79672 Pain in left foot: Secondary | ICD-10-CM | POA: Diagnosis not present

## 2020-08-25 DIAGNOSIS — Z9889 Other specified postprocedural states: Secondary | ICD-10-CM | POA: Diagnosis not present

## 2020-09-22 ENCOUNTER — Encounter: Payer: Self-pay | Admitting: Family Medicine

## 2020-09-22 ENCOUNTER — Ambulatory Visit
Admission: RE | Admit: 2020-09-22 | Discharge: 2020-09-22 | Disposition: A | Discharge status: Home / Self Care | Payer: Medicare Other | Source: Ambulatory Visit | Attending: Family Medicine | Admitting: Family Medicine

## 2020-09-22 ENCOUNTER — Ambulatory Visit (INDEPENDENT_AMBULATORY_CARE_PROVIDER_SITE_OTHER): Payer: Medicare Other | Admitting: Family Medicine

## 2020-09-22 ENCOUNTER — Other Ambulatory Visit: Payer: Self-pay

## 2020-09-22 ENCOUNTER — Other Ambulatory Visit: Payer: Self-pay | Admitting: Family Medicine

## 2020-09-22 VITALS — BP 136/78 | HR 66 | Temp 98.8°F | Resp 14 | Ht 60.0 in | Wt 158.0 lb

## 2020-09-22 DIAGNOSIS — R079 Chest pain, unspecified: Secondary | ICD-10-CM

## 2020-09-22 DIAGNOSIS — R0989 Other specified symptoms and signs involving the circulatory and respiratory systems: Secondary | ICD-10-CM | POA: Diagnosis not present

## 2020-09-22 NOTE — Progress Notes (Signed)
Subjective:    Patient ID: Jackie Lynch, female    DOB: [birth year], 80 y.o.   MRN: [id number]  HPI  Patient presents today complaining of neck pain.  The pain begins on the left side of the neck and radiates up in a lightninglike fashion on the left side of her face.  She states the pain is sudden severe and neuropathic in intensity.  It last seconds to minutes and then resolves.  However when it happened she simply wants to grab her face.  The pain is intense and shooting in nature.  She also complains of neck pain and apparently recently had a cortisone injection in her neck under the care of an orthopedist.  I do not have records of this.  She states that she has had this intense lightninglike pain radiate from her neck into her face 5 times over the last 4 months.  Therefore does not occur often but when it does occur its severe.  She also complains of vague left-sided chest pain.  This seems to start in the left mid abdomen.  She grabs her abdomen below her ribs.  She states that it will start there and then radiate up into the chest.  Is a sharp sudden pain.  It lasts for a few seconds and then subsides and goes away spontaneously.  She denies any chest pain with activity.  She denies any shortness of breath.  She denies any pleurisy.  She denies any hemoptysis.  She has had some coughing recently.  This chest pain has occurred a few isolated times over the last several weeks.  EKG today shows normal sinus rhythm with normal intervals and a normal axis with no evidence of ischemia or infarction. Past Medical History:  Diagnosis Date  . Anemia   . Arthritis   . Constipation   . Diverticulosis of colon   . 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 GERD (gastroesophageal reflux disease)   . H/O osteopenia   . Hyperlipidemia   . Hypertension   . Hyperthyroidism   . Nocturia   . PONV (postoperative nausea and vomiting)   . Renal cyst, congenital, right    Past Surgical  History:  Procedure Laterality Date  . ABDOMINAL HYSTERECTOMY    . CHOLECYSTECTOMY    . JOINT REPLACEMENT    . KNEE ARTHROSCOPY     right and left  . TOTAL HIP ARTHROPLASTY  11/14/2011   Procedure: TOTAL HIP ARTHROPLASTY;  Surgeon: Alta Corning, MD;  Location: [place];  Service: Orthopedics;  Laterality: Left;  . TOTAL KNEE ARTHROPLASTY Right 08/31/2012   Procedure: TOTAL KNEE ARTHROPLASTY;  Surgeon: Alta Corning, MD;  Location: [place];  Service: Orthopedics;  Laterality: Right;  . TUBAL LIGATION     Current Outpatient Medications on File Prior to Visit  Medication Sig Dispense Refill  . cloNIDine (CATAPRES) 0.1 MG tablet TAKE 1 TABLET BY MOUTH 3  TIMES DAILY FOR ELEVATED  BLOOD PRESSURE 270 tablet 3  . diazepam (VALIUM) 5 MG tablet Take 1 tablet (5 mg total) by mouth every 8 (eight) hours as needed for anxiety. 30 tablet 0  . fenofibrate 160 MG tablet TAKE 1 TABLET BY MOUTH  DAILY 90 tablet 3  . gabapentin (NEURONTIN) 100 MG capsule Take 1 capsule (100 mg total) by mouth 3 (three) times daily. 90 capsule 3  . hydrochlorothiazide (HYDRODIURIL) 25 MG tablet TAKE 1 TABLET BY MOUTH  DAILY 90 tablet 3  . levocetirizine (XYZAL)  5 MG tablet Take 1 tablet (5 mg total) by mouth every evening. 30 tablet 2  . linaclotide (LINZESS) 290 MCG CAPS capsule Take 1 capsule (290 mcg total) by mouth daily before breakfast. 90 capsule 3  . losartan (COZAAR) 100 MG tablet TAKE 1 TABLET BY MOUTH  DAILY 90 tablet 3  . methimazole (TAPAZOLE) 10 MG tablet Take 10 mg by mouth 3 (three) times a week.     . metoprolol tartrate (LOPRESSOR) 50 MG tablet TAKE 1 TABLET BY MOUTH  TWICE DAILY 180 tablet 3  . pantoprazole (PROTONIX) 40 MG tablet TAKE 1 TABLET BY MOUTH  TWICE DAILY 180 tablet 3  . pravastatin (PRAVACHOL) 80 MG tablet TAKE 1 TABLET BY MOUTH  DAILY 90 tablet 3  . Probiotic Product (PROBIOTIC FORMULA PO) Take 1 tablet by mouth daily.    Marland Kitchen CALCIUM-VITAMIN D PO Take 1 tablet by mouth daily. Reported on 09/30/2015  (Patient not taking: No sig reported)    . Cholecalciferol (HM VITAMIN D3) 4000 units CAPS Take 4000iu daily. Continue Calcium with Vit D. (Patient not taking: No sig reported) 30 capsule   . docusate sodium (COLACE) 100 MG capsule Take 100 mg by mouth daily. (Patient not taking: No sig reported)    . ergocalciferol (DRISDOL) 8000 UNIT/ML drops Take 1 mL (8,000 Units total) by mouth daily. (Patient not taking: No sig reported) 60 mL 2   No current facility-administered medications on file prior to visit.   Allergies  Allergen Reactions  . Bee Venom Swelling   Social History   Socioeconomic History  . Marital status: Married    Spouse name: Not on file  . Number of children: Not on file  . Years of education: Not on file  . Highest education level: Not on file  Occupational History  . Not on file  Tobacco Use  . Smoking status: Never Smoker  . Smokeless tobacco: Never Used  Substance and Sexual Activity  . Alcohol use: No  . Drug use: No  . Sexual activity: Never  Other Topics Concern  . Not on file  Social History Narrative  . Not on file   Social Determinants of Health   Financial Resource Strain: Not on file  Food Insecurity: Not on file  Transportation Needs: Not on file  Physical Activity: Not on file  Stress: Not on file  Social Connections: Not on file  Intimate Partner Violence: Not on file      Review of Systems  All other systems reviewed and are negative.      Objective:   Physical Exam Vitals reviewed. Exam conducted with a chaperone present.  Constitutional:      General: She is not in acute distress.    Appearance: Normal appearance. She is well-developed. She is not ill-appearing, toxic-appearing or diaphoretic.  Cardiovascular:     Rate and Rhythm: Normal rate and regular rhythm.     Pulses: Normal pulses.     Heart sounds: Normal heart sounds. No murmur heard. No friction rub. No gallop.   Pulmonary:     Effort: Pulmonary effort is  normal. No respiratory distress.     Breath sounds: Rales present. No decreased breath sounds or wheezing.    Abdominal:     General: Abdomen is flat. Bowel sounds are normal. There is no distension.     Palpations: Abdomen is soft. There is no mass.     Tenderness: There is no abdominal tenderness. There is no guarding or rebound.  Musculoskeletal:  Cervical back: Neck supple.     Right lower leg: No edema.     Left lower leg: No edema.  Lymphadenopathy:     Cervical: No cervical adenopathy.  Neurological:     Mental Status: She is alert.           Assessment & Plan:  Chest pain, unspecified type - Plan: EKG 12-Lead, DG Chest 2 View  Chest pain does not sound cardiac in nature.  It seems much more superficial and likely musculoskeletal or neuropathic.  The other potential source could be GI.  However on her physical exam today there are pronounced crackles in her left lung.  This is markedly asymmetric compared to her right lung.  Although I am reassured by the EKG I would like to send the patient immediately for chest x-ray to evaluate due to the abnormal breath sounds.  I believe the pain in her neck radiating into her face is likely nerve impingement secondary to arthritis.  I would suspect that we could treat this with gabapentin.  However I would like to start with a chest x-ray first to determine if the patient may have an underlying pneumonia that may be explaining the chest pain.

## 2020-09-24 ENCOUNTER — Other Ambulatory Visit: Payer: Self-pay | Admitting: *Deleted

## 2020-09-24 MED ORDER — GABAPENTIN 100 MG PO CAPS: 100.0000 mg | ORAL_CAPSULE | Freq: Two times a day (BID) | ORAL | 0 refills | Status: DC

## 2020-09-28 ENCOUNTER — Encounter: Payer: Self-pay | Admitting: Family Medicine

## 2020-09-28 ENCOUNTER — Ambulatory Visit (INDEPENDENT_AMBULATORY_CARE_PROVIDER_SITE_OTHER): Payer: Medicare Other | Admitting: Family Medicine

## 2020-09-28 ENCOUNTER — Other Ambulatory Visit: Payer: Self-pay

## 2020-09-28 VITALS — BP 148/80 | HR 66 | Temp 97.7°F | Resp 14 | Ht 60.0 in | Wt 158.0 lb

## 2020-09-28 DIAGNOSIS — K644 Residual hemorrhoidal skin tags: Secondary | ICD-10-CM

## 2020-09-28 DIAGNOSIS — N8110 Cystocele, unspecified: Secondary | ICD-10-CM

## 2020-09-28 MED ORDER — HYDROCORTISONE 2.5 % EX CREA: TOPICAL_CREAM | Freq: Two times a day (BID) | CUTANEOUS | 2 refills | Status: DC

## 2020-09-28 NOTE — Progress Notes (Signed)
Subjective:    Patient ID: Jackie Lynch, female    DOB: [birth year], 80 y.o.   MRN: [id number]  HPI  09/22/20 Patient presents today complaining of neck pain.  The pain begins on the left side of the neck and radiates up in a lightninglike fashion on the left side of her face.  She states the pain is sudden severe and neuropathic in intensity.  It last seconds to minutes and then resolves.  However when it happened she simply wants to grab her face.  The pain is intense and shooting in nature.  She also complains of neck pain and apparently recently had a cortisone injection in her neck under the care of an orthopedist.  I do not have records of this.  She states that she has had this intense lightninglike pain radiate from her neck into her face 5 times over the last 4 months.  Therefore does not occur often but when it does occur its severe.  She also complains of vague left-sided chest pain.  This seems to start in the left mid abdomen.  She grabs her abdomen below her ribs.  She states that it will start there and then radiate up into the chest.  Is a sharp sudden pain.  It lasts for a few seconds and then subsides and goes away spontaneously.  She denies any chest pain with activity.  She denies any shortness of breath.  She denies any pleurisy.  She denies any hemoptysis.  She has had some coughing recently.  This chest pain has occurred a few isolated times over the last several weeks.  EKG today shows normal sinus rhythm with normal intervals and a normal axis with no evidence of ischemia or infarction.  At that time, my plan was: Chest pain does not sound cardiac in nature.  It seems much more superficial and likely musculoskeletal or neuropathic.  The other potential source could be GI.  However on her physical exam today there are pronounced crackles in her left lung.  This is markedly asymmetric compared to her right lung.  Although I am reassured by the EKG I would like to send the patient  immediately for chest x-ray to evaluate due to the abnormal breath sounds.  I believe the pain in her neck radiating into her face is likely nerve impingement secondary to arthritis.  I would suspect that we could treat this with gabapentin.  However I would like to start with a chest x-ray first to determine if the patient may have an underlying pneumonia that may be explaining the chest pain.  09/28/20 Chest x-ray was unremarkable.  Therefore I recommended the patient take gabapentin for neuropathic pain in her neck and in her face.  However she presents today complaining of bladder prolapse.  She states that she had a bladder tack performed in the early 2000's with mesh.  However this did not go well and she had postoperative complications.  She reports urinary frequency and urge incontinence.  She denies any dysuria or hematuria.  She also reports stress incontinence made worse by coughing or sneezing or straining.  She wants to know if there is anything she can do to help correct the situation.  I am concerned about her short-term memory loss.  Therefore I would like to avoid medications such as Vesicare.  However the patient is about to go on a long trip to see her sister and she is very embarrassed about urge incontinence.  She also complains  of hemorrhoids.  I performed a rectal exam today with a chaperone present and she has enlarged external hemorrhoids on both sides of her rectum.  There is no thrombosis or bleeding however there is excoriations on the skin around it from her scratching.  She states that they itch and burn frequently. Past Medical History:  Diagnosis Date  . Anemia   . Arthritis   . Constipation   . Diverticulosis of colon   . 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 GERD (gastroesophageal reflux disease)   . H/O osteopenia   . Hyperlipidemia   . Hypertension   . Hyperthyroidism   . Nocturia   . PONV (postoperative nausea and vomiting)   . Renal cyst,  congenital, right    Past Surgical History:  Procedure Laterality Date  . ABDOMINAL HYSTERECTOMY    . CHOLECYSTECTOMY    . JOINT REPLACEMENT    . KNEE ARTHROSCOPY     right and left  . TOTAL HIP ARTHROPLASTY  11/14/2011   Procedure: TOTAL HIP ARTHROPLASTY;  Surgeon: Alta Corning, MD;  Location: [facility];  Service: Orthopedics;  Laterality: Left;  . TOTAL KNEE ARTHROPLASTY Right 08/31/2012   Procedure: TOTAL KNEE ARTHROPLASTY;  Surgeon: Alta Corning, MD;  Location: [facility];  Service: Orthopedics;  Laterality: Right;  . TUBAL LIGATION     Current Outpatient Medications on File Prior to Visit  Medication Sig Dispense Refill  . CALCIUM-VITAMIN D PO Take 1 tablet by mouth daily. Reported on 09/30/2015    . Cholecalciferol (HM VITAMIN D3) 4000 units CAPS Take 4000iu daily. Continue Calcium with Vit D. 30 capsule   . cloNIDine (CATAPRES) 0.1 MG tablet TAKE 1 TABLET BY MOUTH 3  TIMES DAILY FOR ELEVATED  BLOOD PRESSURE 270 tablet 3  . diazepam (VALIUM) 5 MG tablet Take 1 tablet (5 mg total) by mouth every 8 (eight) hours as needed for anxiety. 30 tablet 0  . fenofibrate 160 MG tablet TAKE 1 TABLET BY MOUTH  DAILY 90 tablet 3  . gabapentin (NEURONTIN) 100 MG capsule Take 1 capsule (100 mg total) by mouth 2 (two) times daily. 60 capsule 0  . hydrochlorothiazide (HYDRODIURIL) 25 MG tablet TAKE 1 TABLET BY MOUTH  DAILY 90 tablet 3  . losartan (COZAAR) 100 MG tablet TAKE 1 TABLET BY MOUTH  DAILY 90 tablet 3  . methimazole (TAPAZOLE) 10 MG tablet Take 10 mg by mouth 3 (three) times a week.     . metoprolol tartrate (LOPRESSOR) 50 MG tablet TAKE 1 TABLET BY MOUTH  TWICE DAILY 180 tablet 3  . pantoprazole (PROTONIX) 40 MG tablet TAKE 1 TABLET BY MOUTH  TWICE DAILY 180 tablet 3  . pravastatin (PRAVACHOL) 80 MG tablet TAKE 1 TABLET BY MOUTH  DAILY 90 tablet 3  . Probiotic Product (PROBIOTIC FORMULA PO) Take 1 tablet by mouth daily.     No current facility-administered medications on file prior to visit.    Allergies  Allergen Reactions  . Bee Venom Swelling   Social History   Socioeconomic History  . Marital status: Married    Spouse name: Not on file  . Number of children: Not on file  . Years of education: Not on file  . Highest education level: Not on file  Occupational History  . Not on file  Tobacco Use  . Smoking status: Never Smoker  . Smokeless tobacco: Never Used  Substance and Sexual Activity  . Alcohol use: No  . Drug use: No  .  Sexual activity: Never  Other Topics Concern  . Not on file  Social History Narrative  . Not on file   Social Determinants of Health   Financial Resource Strain: Not on file  Food Insecurity: Not on file  Transportation Needs: Not on file  Physical Activity: Not on file  Stress: Not on file  Social Connections: Not on file  Intimate Partner Violence: Not on file      Review of Systems  All other systems reviewed and are negative.      Objective:   Physical Exam Vitals reviewed. Exam conducted with a chaperone present.  Constitutional:      General: She is not in acute distress.    Appearance: Normal appearance. She is well-developed. She is not ill-appearing, toxic-appearing or diaphoretic.  Cardiovascular:     Rate and Rhythm: Normal rate and regular rhythm.     Pulses: Normal pulses.     Heart sounds: Normal heart sounds. No murmur heard. No friction rub. No gallop.   Pulmonary:     Effort: Pulmonary effort is normal. No respiratory distress.     Breath sounds: Rales present. No decreased breath sounds or wheezing.    Abdominal:     General: Abdomen is flat. Bowel sounds are normal. There is no distension.     Palpations: Abdomen is soft. There is no mass.     Tenderness: There is no abdominal tenderness. There is no guarding or rebound.  Genitourinary:    Rectum: External hemorrhoid present. No mass.  Musculoskeletal:     Cervical back: Neck supple.     Right lower leg: No edema.     Left lower leg: No  edema.  Lymphadenopathy:     Cervical: No cervical adenopathy.  Neurological:     Mental Status: She is alert.           Assessment & Plan:   Bladder prolapse, female, acquired - Plan: Ambulatory referral to Gynecology  External hemorrhoid  Patient would like to try Toviaz 8 mg a day only during this upcoming trip.  I cautioned her about sedation, dizziness, and confusion.  I recommended that she not take gabapentin with the Toviaz to avoid potential cognitive effects and dizziness.  When she gets back home we will going to stop the Toviaz only gave her samples.  Afterwards someone to refer her to a gynecologist to discuss possible pessary use given her age and medical comorbidities.  This may be her best short-term solution.  I will treat the external hemorrhoids with hydrocortisone 2.5% cream twice daily for 10 to 14 days

## 2020-09-29 ENCOUNTER — Ambulatory Visit: Payer: Medicare Other | Admitting: Family Medicine

## 2020-10-08 DIAGNOSIS — E0590 Thyrotoxicosis, unspecified without thyrotoxic crisis or storm: Secondary | ICD-10-CM | POA: Diagnosis not present

## 2020-10-15 ENCOUNTER — Encounter: Payer: Self-pay | Admitting: Family Medicine

## 2020-10-15 ENCOUNTER — Ambulatory Visit (INDEPENDENT_AMBULATORY_CARE_PROVIDER_SITE_OTHER): Payer: Medicare Other | Admitting: Family Medicine

## 2020-10-15 VITALS — BP 179/67 | HR 64 | Ht 60.0 in | Wt 157.9 lb

## 2020-10-15 DIAGNOSIS — E119 Type 2 diabetes mellitus without complications: Secondary | ICD-10-CM

## 2020-10-15 DIAGNOSIS — I10 Essential (primary) hypertension: Secondary | ICD-10-CM

## 2020-10-15 DIAGNOSIS — N289 Disorder of kidney and ureter, unspecified: Secondary | ICD-10-CM | POA: Diagnosis not present

## 2020-10-15 DIAGNOSIS — E0590 Thyrotoxicosis, unspecified without thyrotoxic crisis or storm: Secondary | ICD-10-CM | POA: Diagnosis not present

## 2020-10-15 DIAGNOSIS — N819 Female genital prolapse, unspecified: Secondary | ICD-10-CM

## 2020-10-15 NOTE — Assessment & Plan Note (Signed)
On no meds 

## 2020-10-15 NOTE — Progress Notes (Signed)
   Subjective:    Patient ID: Jackie Lynch is a 80 y.o. female presenting with Bladder Prolapse  on 10/15/2020  HPI: No longer sexually active. Jackie Lynch passed in November. Had TVH/BSO and Anterior and posterior repair. 2 weeks ago, noted that her bladder was bulging out during a shower. No real pain, feels some pressure and has some burning with urination. Loses her urine "when I have to go".  Review of Systems  Constitutional: Negative for chills and fever.  Respiratory: Negative for shortness of breath.   Cardiovascular: Negative for chest pain.  Gastrointestinal: Negative for abdominal pain, nausea and vomiting.  Genitourinary: Negative for dysuria.  Skin: Negative for rash.      Objective:    BP (!) 179/67   Pulse 64   Ht 5' (1.524 m)   Wt 157 lb 14.4 oz (71.6 kg)   BMI 30.84 kg/m  Physical Exam Exam conducted with a chaperone present (.).  Constitutional:      General: She is not in acute distress.    Appearance: She is well-developed.  HENT:     Head: Normocephalic and atraumatic.  Eyes:     General: No scleral icterus. Cardiovascular:     Rate and Rhythm: Normal rate.  Pulmonary:     Effort: Pulmonary effort is normal.  Abdominal:     Palpations: Abdomen is soft.  Genitourinary:    Comments: Has apparent enterocele or vaginal vault prolapse with possible bladder falling as well, worse with valsalva Musculoskeletal:     Cervical back: Neck supple.  Skin:    General: Skin is warm and dry.  Neurological:     Mental Status: She is alert and oriented to person, place, and time.         Assessment & Plan:   Problem List Items Addressed This Visit      Unprioritized   Renal insufficiency   Hypertension - Primary    On multiple meds and poorly controlled today      Controlled type 2 diabetes mellitus without complication, without long-term current use of insulin ([place])    On no meds      Hyperthyroidism    Off meds right now--has f/u in 2 months       Relevant Medications   methimazole (TAPAZOLE) 5 MG tablet   Vaginal vault prolapse    S/p previous TVH and anterior and posterior repair. Offered pessary, discussed risks of surgery and she really wants a surgical repair. Will send to Uro/Gyn for further eval.      Relevant Orders   Ambulatory referral to Urogynecology      Return if symptoms worsen or fail to improve.  [name] 10/15/2020 4:51 PM

## 2020-10-15 NOTE — Assessment & Plan Note (Signed)
Off meds right now--has f/u in 2 months

## 2020-10-15 NOTE — Assessment & Plan Note (Signed)
On multiple meds and poorly controlled today

## 2020-10-15 NOTE — Progress Notes (Signed)
error 

## 2020-10-15 NOTE — Patient Instructions (Signed)
Pelvic Organ Prolapse Pelvic organ prolapse is a condition in women that involves the stretching, bulging, or dropping of pelvic organs into an abnormal position, past the opening of the vagina. It happens when the muscles and tissues that surround and support pelvic structures become weak or stretched. Pelvic organ prolapse can involve the:  Vagina (vaginal prolapse).  Uterus (uterine prolapse).  Bladder (cystocele).  Rectum (rectocele).  Intestines (enterocele). When organs other than the vagina are involved, they often bulge into the vagina or protrude from the vagina, depending on how severe the prolapse is. What are the causes? This condition may be caused by:  Pregnancy, labor, and childbirth.  Past pelvic surgery.  Lower levels of the hormone estrogen due to menopause.  Consistently lifting more than 50 lb (23 kg).  Obesity.  Long-term difficulty passing stool (chronic constipation).  Long-term, or chronic, cough.  Fluid buildup in the abdomen due to certain conditions. What are the signs or symptoms? Symptoms of this condition include:  Leaking a little urine (loss of bladder control) when you cough, sneeze, strain, and exercise (stress incontinence). This may be worse immediately after childbirth. It may gradually improve over time.  Feeling pressure in your pelvis or vagina. This pressure may increase when you cough or when you are passing stool.  A bulge that protrudes from the opening of your vagina.  Difficulty passing urine or stool.  Pain in your lower back.  Pain or discomfort during sex, or decreased interest in sex.  Repeated bladder infections (urinary tract infections).  Difficulty inserting a tampon. In some people, this condition causes no symptoms. How is this diagnosed? This condition may be diagnosed based on a vaginal and rectal exam. During the exam, you may be asked to cough and strain while you are lying down, sitting, and standing up.  Your health care provider will determine if other tests are required, such as bladder function tests. How is this treated? Treatment for this condition may depend on your symptoms. Treatment may include:  Lifestyle changes, such as drinking plenty of fluids and eating foods that are high in fiber.  Emptying your bladder at scheduled times (bladder training therapy). This can help reduce or avoid urinary incontinence.  Estrogen. This may help mild prolapse by increasing the strength and tone of pelvic floor muscles.  Kegel exercises. These may help mild cases of prolapse by strengthening and tightening the muscles of the pelvic floor.  A soft, flexible device that helps support the vaginal walls and keep pelvic organs in place (pessary). This is inserted into your vagina by your health care provider.  Surgery. This is often the only form of treatment for severe prolapse. Follow these instructions at home: Eating and drinking  Avoid drinking beverages that contain caffeine or alcohol.  Increase your intake of high-fiber foods to decrease constipation and straining during bowel movements. Activity  Lose weight if recommended by your health care provider.  Avoid heavy lifting and straining with exercise and work. Do not hold your breath when you perform mild to moderate lifting and exercise activities. Limit your activities as directed by your health care provider.  Do Kegel exercises as directed by your health care provider. To do this: ? Squeeze your pelvic floor muscles tight. You should feel a tight lift in your rectal area and a tightness in your vaginal area. Keep your stomach, buttocks, and legs relaxed. ? Hold the muscles tight for up to 10 seconds. Then relax your muscles. ? Repeat this exercise  50 times a day, or as much as told by your health care provider. Continue to do this exercise for at least 4-6 weeks, or for as long as told by your health care provider. General  instructions  Take over-the-counter and prescription medicines only as told by your health care provider.  Wear a sanitary pad or adult diapers if you have urinary incontinence.  If you have a pessary, take care of it as told by your health care provider.  Keep all follow-up visits. This is important. Contact a health care provider if you:  Have symptoms that interfere with your daily activities or sex life.  Need medicine to help with the discomfort.  Notice bleeding from your vagina that is not related to your menstrual period.  Have a fever.  Have pain or bleeding when you urinate.  Have bleeding when you pass stool.  Pass urine when you have sex.  Have chronic constipation.  Have a pessary that falls out.  Have a foul-smelling vaginal discharge.  Have an unusual, low pain in your abdomen. Get help right away if you:  Cannot pass urine. Summary  Pelvic organ prolapse is the stretching, bulging, or dropping of pelvic organs into an abnormal position. It happens when the muscles and tissues that surround and support pelvic structures become weak or stretched.  When organs other than the vagina are involved, they often bulge into the vagina or protrude from it, depending on how severe the prolapse is.  In most cases, this condition needs to be treated only if it produces symptoms. Treatment may include lifestyle changes, estrogen, Kegel exercises, pessary insertion, or surgery.  Avoid heavy lifting and straining with exercise and work. Do not hold your breath when you perform mild to moderate lifting and exercise activities. Limit your activities as directed by your health care provider. This information is not intended to replace advice given to you by your health care provider. Make sure you discuss any questions you have with your health care provider. Document Revised: 12/16/2019 Document Reviewed: 12/16/2019 Elsevier Patient Education  [place].

## 2020-10-15 NOTE — Assessment & Plan Note (Signed)
S/p previous TVH and anterior and posterior repair. Offered pessary, discussed risks of surgery and she really wants a surgical repair. Will send to Uro/Gyn for further eval.

## 2020-10-21 ENCOUNTER — Other Ambulatory Visit: Payer: Self-pay | Admitting: Family Medicine

## 2020-11-05 ENCOUNTER — Ambulatory Visit: Payer: Medicare Other | Admitting: Family Medicine

## 2020-11-16 ENCOUNTER — Other Ambulatory Visit: Payer: Self-pay

## 2020-11-16 ENCOUNTER — Encounter: Payer: Self-pay | Admitting: Family Medicine

## 2020-11-16 ENCOUNTER — Other Ambulatory Visit: Payer: Self-pay | Admitting: Family Medicine

## 2020-11-16 ENCOUNTER — Ambulatory Visit (INDEPENDENT_AMBULATORY_CARE_PROVIDER_SITE_OTHER): Payer: Medicare Other | Admitting: Family Medicine

## 2020-11-16 VITALS — BP 148/82 | HR 64 | Temp 98.1°F | Resp 14 | Ht 60.0 in | Wt 157.0 lb

## 2020-11-16 DIAGNOSIS — K219 Gastro-esophageal reflux disease without esophagitis: Secondary | ICD-10-CM

## 2020-11-16 MED ORDER — ESOMEPRAZOLE MAGNESIUM 40 MG PO CPDR: 40.0000 mg | DELAYED_RELEASE_CAPSULE | Freq: Two times a day (BID) | ORAL | 3 refills | Status: DC

## 2020-11-16 MED ORDER — SUCRALFATE 1 G PO TABS: 1.0000 g | ORAL_TABLET | Freq: Three times a day (TID) | ORAL | 0 refills | Status: DC

## 2020-11-16 MED ORDER — ONDANSETRON HCL 4 MG PO TABS: 4.0000 mg | ORAL_TABLET | Freq: Three times a day (TID) | ORAL | 0 refills | Status: DC | PRN

## 2020-11-16 NOTE — Progress Notes (Signed)
Subjective:    Patient ID: Jackie Lynch, female    DOB: 1941-07-03, 80 y.o.   MRN: [id number]  HPI  Patient reports daily acid reflux.  She reports a burning sensation that comes up in her chest at times.  It radiates from below the xiphoid process up into her throat.  She also reports indigestion frequently when she eats.  She also reports frequent "salty wet burps".  It hurts worse when she lays down at night.  She has been taking Protonix twice a day per her report and states that this is not helping.  She denies any melena or hematochezia.  She denies any fevers or chills.  She denies any pleurisy or hemoptysis. Past Medical History:  Diagnosis Date  . Anemia   . Arthritis   . Constipation   . Diverticulosis of colon   . 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 GERD (gastroesophageal reflux disease)   . H/O osteopenia   . Hyperlipidemia   . Hypertension   . Hyperthyroidism   . Nocturia   . PONV (postoperative nausea and vomiting)   . Renal cyst, congenital, right    Past Surgical History:  Procedure Laterality Date  . ABDOMINAL HYSTERECTOMY    . CHOLECYSTECTOMY    . JOINT REPLACEMENT    . KNEE ARTHROSCOPY     right and left  . TOTAL HIP ARTHROPLASTY  11/14/2011   Procedure: TOTAL HIP ARTHROPLASTY;  Surgeon: Alta Corning, MD;  Location: [place] [facility];  Service: Orthopedics;  Laterality: Left;  . TOTAL KNEE ARTHROPLASTY Right 08/31/2012   Procedure: TOTAL KNEE ARTHROPLASTY;  Surgeon: Alta Corning, MD;  Location: [place];  Service: Orthopedics;  Laterality: Right;  . TUBAL LIGATION     Current Outpatient Medications on File Prior to Visit  Medication Sig Dispense Refill  . cloNIDine (CATAPRES) 0.1 MG tablet TAKE 1 TABLET BY MOUTH 3  TIMES DAILY FOR ELEVATED  BLOOD PRESSURE 270 tablet 3  . diazepam (VALIUM) 5 MG tablet Take 1 tablet (5 mg total) by mouth every 8 (eight) hours as needed for anxiety. 30 tablet 0  . fenofibrate 160 MG tablet TAKE 1 TABLET BY MOUTH  DAILY  90 tablet 3  . gabapentin (NEURONTIN) 100 MG capsule TAKE 1 CAPSULE(100 MG) BY MOUTH TWICE DAILY 60 capsule 6  . hydrochlorothiazide (HYDRODIURIL) 25 MG tablet TAKE 1 TABLET BY MOUTH  DAILY 90 tablet 3  . hydrocortisone 2.5 % cream Apply topically 2 (two) times daily. 30 g 2  . losartan (COZAAR) 100 MG tablet TAKE 1 TABLET BY MOUTH  DAILY 90 tablet 3  . methimazole (TAPAZOLE) 5 MG tablet Take by mouth.    . metoprolol tartrate (LOPRESSOR) 50 MG tablet TAKE 1 TABLET BY MOUTH  TWICE DAILY 180 tablet 3  . pantoprazole (PROTONIX) 40 MG tablet TAKE 1 TABLET BY MOUTH  TWICE DAILY 180 tablet 3  . pravastatin (PRAVACHOL) 80 MG tablet TAKE 1 TABLET BY MOUTH  DAILY 90 tablet 3  . Probiotic Product (PROBIOTIC FORMULA PO) Take 1 tablet by mouth daily.    . traMADol (ULTRAM) 50 MG tablet Take 50 mg by mouth every 8 (eight) hours as needed.     No current facility-administered medications on file prior to visit.   Allergies  Allergen Reactions  . Bee Venom Swelling   Social History   Socioeconomic History  . Marital status: Widowed    Spouse name: Not on file  . Number of children: Not on  file  . Years of education: Not on file  . Highest education level: Not on file  Occupational History  . Not on file  Tobacco Use  . Smoking status: Never Smoker  . Smokeless tobacco: Never Used  Vaping Use  . Vaping Use: Never used  Substance and Sexual Activity  . Alcohol use: No  . Drug use: No  . Sexual activity: Never  Other Topics Concern  . Not on file  Social History Narrative  . Not on file   Social Determinants of Health   Financial Resource Strain: Not on file  Food Insecurity: Not on file  Transportation Needs: Not on file  Physical Activity: Not on file  Stress: Not on file  Social Connections: Not on file  Intimate Partner Violence: Not on file      Review of Systems  All other systems reviewed and are negative.      Objective:   Physical Exam Vitals reviewed. Exam  conducted with a chaperone present.  Constitutional:      General: She is not in acute distress.    Appearance: Normal appearance. She is well-developed. She is not ill-appearing, toxic-appearing or diaphoretic.  Cardiovascular:     Rate and Rhythm: Normal rate and regular rhythm.     Pulses: Normal pulses.     Heart sounds: Normal heart sounds. No murmur heard. No friction rub. No gallop.   Pulmonary:     Effort: Pulmonary effort is normal. No respiratory distress.     Breath sounds: No decreased breath sounds or wheezing.  Abdominal:     General: Abdomen is flat. Bowel sounds are normal. There is no distension.     Palpations: Abdomen is soft. There is no mass.     Tenderness: There is no abdominal tenderness. There is no guarding or rebound.  Musculoskeletal:     Cervical back: Neck supple.     Right lower leg: No edema.     Left lower leg: No edema.  Lymphadenopathy:     Cervical: No cervical adenopathy.  Neurological:     Mental Status: She is alert.           Assessment & Plan:  Gastroesophageal reflux disease, unspecified whether esophagitis present - Plan: CBC with Differential/Platelet, COMPLETE METABOLIC PANEL WITH GFR, Lipase, H. pylori breath test  Patient states that she has been taking Protonix every day.  I recommended that she switch to Nexium 40 mg twice a day because she states that that worked better for her in the past.  I also want her to use sucralfate 1 g p.o. q. ACH S..  Meanwhile check CBC, CMP, lipase.  Check H. pylori test.  Reassess in 2 weeks.  If not improving, will need EGD.  Also use Zofran 4 mg p.o. every 8 hours as needed nausea

## 2020-11-17 ENCOUNTER — Telehealth: Payer: Self-pay | Admitting: Family Medicine

## 2020-11-17 LAB — COMPLETE METABOLIC PANEL WITH GFR
AG Ratio: 1.6 (calc) (ref 1.0–2.5)
ALT: 12 U/L (ref 6–29)
AST: 19 U/L (ref 10–35)
Albumin: 4.1 g/dL (ref 3.6–5.1)
Alkaline phosphatase (APISO): 28 U/L — ABNORMAL LOW (ref 37–153)
BUN/Creatinine Ratio: 25 (calc) — ABNORMAL HIGH (ref 6–22)
BUN: 26 mg/dL — ABNORMAL HIGH (ref 7–25)
CO2: 29 mmol/L (ref 20–32)
Calcium: 9.6 mg/dL (ref 8.6–10.4)
Chloride: 102 mmol/L (ref 98–110)
Creat: 1.05 mg/dL — ABNORMAL HIGH (ref 0.60–0.93)
GFR, Est African American: 59 mL/min/{1.73_m2} — ABNORMAL LOW (ref >=60)
GFR, Est Non African American: 51 mL/min/{1.73_m2} — ABNORMAL LOW (ref >=60)
Globulin: 2.5 g/dL (calc) (ref 1.9–3.7)
Glucose, Bld: 97 mg/dL (ref 65–99)
Potassium: 3.4 mmol/L — ABNORMAL LOW (ref 3.5–5.3)
Sodium: 139 mmol/L (ref 135–146)
Total Bilirubin: 0.4 mg/dL (ref 0.2–1.2)
Total Protein: 6.6 g/dL (ref 6.1–8.1)

## 2020-11-17 LAB — CBC WITH DIFFERENTIAL/PLATELET
Absolute Monocytes: 694 cells/uL (ref 200–950)
Basophils Absolute: 80 cells/uL (ref 0–200)
Basophils Relative: 0.9 %
Eosinophils Absolute: 427 cells/uL (ref 15–500)
Eosinophils Relative: 4.8 %
HCT: 34.5 % — ABNORMAL LOW (ref 35.0–45.0)
Hemoglobin: 11.3 g/dL — ABNORMAL LOW (ref 11.7–15.5)
Lymphs Abs: 3516 cells/uL (ref 850–3900)
MCH: 30.1 pg (ref 27.0–33.0)
MCHC: 32.8 g/dL (ref 32.0–36.0)
MCV: 91.8 fL (ref 80.0–100.0)
MPV: 13.2 fL — ABNORMAL HIGH (ref 7.5–12.5)
Monocytes Relative: 7.8 %
Neutro Abs: 4183 cells/uL (ref 1500–7800)
Neutrophils Relative %: 47 %
Platelets: 187 10*3/uL (ref 140–400)
RBC: 3.76 10*6/uL — ABNORMAL LOW (ref 3.80–5.10)
RDW: 12.7 % (ref 11.0–15.0)
Total Lymphocyte: 39.5 %
WBC: 8.9 10*3/uL (ref 3.8–10.8)

## 2020-11-17 LAB — LIPASE: Lipase: 73 U/L — ABNORMAL HIGH (ref 7–60)

## 2020-11-17 NOTE — Telephone Encounter (Signed)
Please see labs for further information.  

## 2020-11-17 NOTE — Telephone Encounter (Signed)
Patient called to follow up on lab results from tests taken yesterday; unable to access results on MyChart. Please advise at [phone]

## 2020-11-18 LAB — H. PYLORI BREATH TEST: H. pylori Breath Test: NOT DETECTED

## 2020-11-23 DIAGNOSIS — E0590 Thyrotoxicosis, unspecified without thyrotoxic crisis or storm: Secondary | ICD-10-CM | POA: Diagnosis not present

## 2020-12-01 ENCOUNTER — Encounter: Payer: Self-pay | Admitting: Family Medicine

## 2020-12-01 ENCOUNTER — Other Ambulatory Visit: Payer: Self-pay

## 2020-12-01 ENCOUNTER — Ambulatory Visit (INDEPENDENT_AMBULATORY_CARE_PROVIDER_SITE_OTHER): Payer: Medicare Other | Admitting: Family Medicine

## 2020-12-01 VITALS — BP 132/78 | HR 74 | Temp 98.0°F | Resp 16 | Ht 60.0 in | Wt 152.0 lb

## 2020-12-01 DIAGNOSIS — K219 Gastro-esophageal reflux disease without esophagitis: Secondary | ICD-10-CM | POA: Diagnosis not present

## 2020-12-01 DIAGNOSIS — R748 Abnormal levels of other serum enzymes: Secondary | ICD-10-CM

## 2020-12-01 DIAGNOSIS — R110 Nausea: Secondary | ICD-10-CM | POA: Diagnosis not present

## 2020-12-01 NOTE — Progress Notes (Signed)
Subjective:    Patient ID: Jackie Lynch, female    DOB: 08/08/1940, 80 y.o.   MRN: [id number]  HPI  11/16/20 Patient reports daily acid reflux.  She reports a burning sensation that comes up in her chest at times.  It radiates from below the xiphoid process up into her throat.  She also reports indigestion frequently when she eats.  She also reports frequent "salty wet burps".  It hurts worse when she lays down at night.  She has been taking Protonix twice a day per her report and states that this is not helping.  She denies any melena or hematochezia.  She denies any fevers or chills.  She denies any pleurisy or hemoptysis.  At that time, my plan was: Patient states that she has been taking Protonix every day.  I recommended that she switch to Nexium 40 mg twice a day because she states that that worked better for her in the past.  I also want her to use sucralfate 1 g p.o. q. ACH S..  Meanwhile check CBC, CMP, lipase.  Check H. pylori test.  Reassess in 2 weeks.  If not improving, will need EGD.  Also use Zofran 4 mg p.o. every 8 hours as needed nausea  12/01/20 Office Visit on 11/16/2020  Component Date Value Ref Range Status  . WBC 11/16/2020 8.9  3.8 - 10.8 Thousand/uL Final  . RBC 11/16/2020 3.76* 3.80 - 5.10 Million/uL Final  . Hemoglobin 11/16/2020 11.3* 11.7 - 15.5 g/dL Final  . HCT 11/16/2020 34.5* 35.0 - 45.0 % Final  . MCV 11/16/2020 91.8  80.0 - 100.0 fL Final  . MCH 11/16/2020 30.1  27.0 - 33.0 pg Final  . MCHC 11/16/2020 32.8  32.0 - 36.0 g/dL Final  . RDW 11/16/2020 12.7  11.0 - 15.0 % Final  . Platelets 11/16/2020 187  140 - 400 Thousand/uL Final  . MPV 11/16/2020 13.2* 7.5 - 12.5 fL Final  . Neutro Abs 11/16/2020 4,183  1,500 - 7,800 cells/uL Final  . Lymphs Abs 11/16/2020 3,516  850 - 3,900 cells/uL Final  . Absolute Monocytes 11/16/2020 694  200 - 950 cells/uL Final  . Eosinophils Absolute 11/16/2020 427  15 - 500 cells/uL Final  . Basophils Absolute 11/16/2020 80  0 -  200 cells/uL Final  . Neutrophils Relative % 11/16/2020 47  % Final  . Total Lymphocyte 11/16/2020 39.5  % Final  . Monocytes Relative 11/16/2020 7.8  % Final  . Eosinophils Relative 11/16/2020 4.8  % Final  . Basophils Relative 11/16/2020 0.9  % Final  . Glucose, Bld 11/16/2020 97  65 - 99 mg/dL Final   Comment: .            Fasting reference interval .   . BUN 11/16/2020 26* 7 - 25 mg/dL Final  . Creat 11/16/2020 1.05* 0.60 - 0.93 mg/dL Final   Comment: For patients >2 years of age, the reference limit for Creatinine is approximately 13% higher for people identified as African-American. .   . GFR, Est Non African American 11/16/2020 51* > OR = 60 mL/min/1.51m2 Final  . GFR, Est African American 11/16/2020 59* > OR = 60 mL/min/1.78m2 Final  . BUN/Creatinine Ratio 11/16/2020 25* 6 - 22 (calc) Final  . Sodium 11/16/2020 139  135 - 146 mmol/L Final  . Potassium 11/16/2020 3.4* 3.5 - 5.3 mmol/L Final  . Chloride 11/16/2020 102  98 - 110 mmol/L Final  . CO2 11/16/2020 29  20 - 32  mmol/L Final  . Calcium 11/16/2020 9.6  8.6 - 10.4 mg/dL Final  . Total Protein 11/16/2020 6.6  6.1 - 8.1 g/dL Final  . Albumin 11/16/2020 4.1  3.6 - 5.1 g/dL Final  . Globulin 11/16/2020 2.5  1.9 - 3.7 g/dL (calc) Final  . AG Ratio 11/16/2020 1.6  1.0 - 2.5 (calc) Final  . Total Bilirubin 11/16/2020 0.4  0.2 - 1.2 mg/dL Final  . Alkaline phosphatase (APISO) 11/16/2020 28* 37 - 153 U/L Final  . AST 11/16/2020 19  10 - 35 U/L Final  . ALT 11/16/2020 12  6 - 29 U/L Final  . Lipase 11/16/2020 73* 7 - 60 U/L Final  . H. pylori Breath Test 11/16/2020 NOT DETECTED  NOT DETECTED Final   Comment: . Antimicrobials, proton pump inhibitors, and bismuth preparations are known to suppress H. pylori, and  ingestion of these prior to H. pylori diagnostic testing may lead to false negative results. If clinically  indicated, the test may be repeated on a new specimen obtained two weeks after discontinuing  treatment. However, a positive result is still clinically valid.    Patient is a very sweet 80 year old Caucasian female here today for follow-up.  She has a past medical history of gastritis and GERD.  At her last visit I started her on twice daily Nexium and sucralfate 4 times daily and also gave her Zofran.  She states that the heartburn is some better.  However she continues to have a lot of nausea and she is asking for refill on the Zofran.  It seems like more of her symptoms at this point is constant nausea and upset stomach.  It seems like some of the burning and reflux have improved on the dual therapy.  She continues to deny any fever or melena or hematochezia.  On her lab work there was a mild elevation in her lipase which I do not believe is clinically significant.  My suspicions are possibly an ulcer versus gastritis versus IBS related to stress given the death of her husband versus less likely malignancy in the stomach.  However at this point, her symptoms persist.  We discussed today whether or not she wanted to see her gastroenterologist for an EGD.  She states that this would make her feel more relieved if she can get "a clear bill of health". Past Medical History:  Diagnosis Date  . Anemia   . Arthritis   . Constipation   . Diverticulosis of colon   . 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 GERD (gastroesophageal reflux disease)   . H/O osteopenia   . Hyperlipidemia   . Hypertension   . Hyperthyroidism   . Nocturia   . PONV (postoperative nausea and vomiting)   . Renal cyst, congenital, right    Past Surgical History:  Procedure Laterality Date  . ABDOMINAL HYSTERECTOMY    . CHOLECYSTECTOMY    . JOINT REPLACEMENT    . KNEE ARTHROSCOPY     right and left  . TOTAL HIP ARTHROPLASTY  11/14/2011   Procedure: TOTAL HIP ARTHROPLASTY;  Surgeon: Alta Corning, MD;  Location: [place] [facility];  Service: Orthopedics;  Laterality: Left;  . TOTAL KNEE ARTHROPLASTY Right  08/31/2012   Procedure: TOTAL KNEE ARTHROPLASTY;  Surgeon: Alta Corning, MD;  Location: [place];  Service: Orthopedics;  Laterality: Right;  . TUBAL LIGATION     Current Outpatient Medications on File Prior to Visit  Medication Sig Dispense Refill  . cloNIDine (CATAPRES)  0.1 MG tablet TAKE 1 TABLET BY MOUTH 3  TIMES DAILY FOR ELEVATED  BLOOD PRESSURE 270 tablet 3  . diazepam (VALIUM) 5 MG tablet Take 1 tablet (5 mg total) by mouth every 8 (eight) hours as needed for anxiety. 30 tablet 0  . esomeprazole (NEXIUM) 40 MG capsule Take 1 capsule (40 mg total) by mouth in the morning and at bedtime. 60 capsule 3  . fenofibrate 160 MG tablet TAKE 1 TABLET BY MOUTH  DAILY 90 tablet 3  . gabapentin (NEURONTIN) 100 MG capsule TAKE 1 CAPSULE(100 MG) BY MOUTH TWICE DAILY 60 capsule 6  . hydrochlorothiazide (HYDRODIURIL) 25 MG tablet TAKE 1 TABLET BY MOUTH  DAILY 90 tablet 3  . hydrocortisone 2.5 % cream Apply topically 2 (two) times daily. 30 g 2  . losartan (COZAAR) 100 MG tablet TAKE 1 TABLET BY MOUTH  DAILY 90 tablet 3  . methimazole (TAPAZOLE) 5 MG tablet Take by mouth.    . metoprolol tartrate (LOPRESSOR) 50 MG tablet TAKE 1 TABLET BY MOUTH  TWICE DAILY 180 tablet 3  . ondansetron (ZOFRAN) 4 MG tablet Take 1 tablet (4 mg total) by mouth every 8 (eight) hours as needed for nausea or vomiting. 20 tablet 0  . pantoprazole (PROTONIX) 40 MG tablet TAKE 1 TABLET BY MOUTH  TWICE DAILY 180 tablet 3  . pravastatin (PRAVACHOL) 80 MG tablet TAKE 1 TABLET BY MOUTH  DAILY 90 tablet 3  . Probiotic Product (PROBIOTIC FORMULA PO) Take 1 tablet by mouth daily.    . sucralfate (CARAFATE) 1 g tablet TAKE 1 TABLET( 1 GRAM) BY MOUTH IN THE MORNING, AT NOON, AND AT BEDTIME 270 tablet 1  . traMADol (ULTRAM) 50 MG tablet Take 50 mg by mouth every 8 (eight) hours as needed.     No current facility-administered medications on file prior to visit.   Allergies  Allergen Reactions  . Bee Venom Swelling   Social History    Socioeconomic History  . Marital status: Widowed    Spouse name: Not on file  . Number of children: Not on file  . Years of education: Not on file  . Highest education level: Not on file  Occupational History  . Not on file  Tobacco Use  . Smoking status: Never Smoker  . Smokeless tobacco: Never Used  Vaping Use  . Vaping Use: Never used  Substance and Sexual Activity  . Alcohol use: No  . Drug use: No  . Sexual activity: Never  Other Topics Concern  . Not on file  Social History Narrative  . Not on file   Social Determinants of Health   Financial Resource Strain: Not on file  Food Insecurity: Not on file  Transportation Needs: Not on file  Physical Activity: Not on file  Stress: Not on file  Social Connections: Not on file  Intimate Partner Violence: Not on file      Review of Systems  All other systems reviewed and are negative.      Objective:   Physical Exam Vitals reviewed. Exam conducted with a chaperone present.  Constitutional:      General: She is not in acute distress.    Appearance: Normal appearance. She is well-developed. She is not ill-appearing, toxic-appearing or diaphoretic.  Cardiovascular:     Rate and Rhythm: Normal rate and regular rhythm.     Pulses: Normal pulses.     Heart sounds: Normal heart sounds. No murmur heard. No friction rub. No gallop.   Pulmonary:  Effort: Pulmonary effort is normal. No respiratory distress.     Breath sounds: No decreased breath sounds or wheezing.  Abdominal:     General: Abdomen is flat. Bowel sounds are normal. There is no distension.     Palpations: Abdomen is soft. There is no mass.     Tenderness: There is no abdominal tenderness. There is no guarding or rebound.  Musculoskeletal:     Cervical back: Neck supple.     Right lower leg: No edema.     Left lower leg: No edema.  Lymphadenopathy:     Cervical: No cervical adenopathy.  Neurological:     Mental Status: She is alert.            Assessment & Plan:  Gastroesophageal reflux disease, unspecified whether esophagitis present  Elevated lipase - Plan: Lipase  Differential diagnosis includes GERD, gastritis, peptic ulcer disease, IBS related to stress given the recent passing of her husband, and less likely stomach malignancy.  However given the fact that her symptoms have persisted despite being on daily PPI therapy and now twice daily PPI therapy in addition to sucralfate, I will set up an appointment for her to see her gastroenterologist to discuss whether or not she needs an EGD to evaluate further to rule out underlying pathology in the stomach.  Continue Nexium and sucralfate for the time being.  I will refill her Zofran at her request.  I will also repeat her lipase.  If the lipase is persistently elevated we will need to image the pancreas.

## 2020-12-02 LAB — LIPASE: Lipase: 68 U/L — ABNORMAL HIGH (ref 7–60)

## 2020-12-09 ENCOUNTER — Telehealth: Payer: Self-pay

## 2020-12-09 ENCOUNTER — Telehealth: Payer: Self-pay | Admitting: Family Medicine

## 2020-12-09 ENCOUNTER — Other Ambulatory Visit: Payer: Self-pay

## 2020-12-09 DIAGNOSIS — R748 Abnormal levels of other serum enzymes: Secondary | ICD-10-CM

## 2020-12-09 DIAGNOSIS — K858 Other acute pancreatitis without necrosis or infection: Secondary | ICD-10-CM

## 2020-12-09 NOTE — Telephone Encounter (Signed)
Pt called to get results of lab for lipase. Ask that labs be mailed along with the info from gastro referral. CT has been ordered as requested by pcp

## 2020-12-09 NOTE — Telephone Encounter (Signed)
No answer unable to leave a message for patient to call back and schedule Medicare Annual Wellness Visit (AWV) in office.   If not able to come in office, please offer to do virtually or by telephone.   Last AWV: 03/16/2017   Please schedule at anytime with [facility] Ad[facility].  If any questions, please contact me at [phone]

## 2020-12-10 ENCOUNTER — Other Ambulatory Visit: Payer: Self-pay | Admitting: *Deleted

## 2020-12-10 DIAGNOSIS — K858 Other acute pancreatitis without necrosis or infection: Secondary | ICD-10-CM

## 2020-12-10 DIAGNOSIS — R748 Abnormal levels of other serum enzymes: Secondary | ICD-10-CM

## 2020-12-16 ENCOUNTER — Other Ambulatory Visit: Payer: Self-pay | Admitting: *Deleted

## 2020-12-16 MED ORDER — ONDANSETRON HCL 4 MG PO TABS: 4.0000 mg | ORAL_TABLET | Freq: Three times a day (TID) | ORAL | 0 refills | Status: DC | PRN

## 2020-12-16 NOTE — Telephone Encounter (Signed)
Received call from patient.   Requested refill on Diazepam.   Ok to refill??  Last office visit 11/13/2020.  Last refill 03/04/2020.

## 2020-12-17 MED ORDER — DIAZEPAM 5 MG PO TABS: 5.0000 mg | ORAL_TABLET | Freq: Three times a day (TID) | ORAL | 0 refills | Status: DC | PRN

## 2020-12-18 ENCOUNTER — Ambulatory Visit
Admission: RE | Admit: 2020-12-18 | Discharge: 2020-12-18 | Disposition: A | Discharge status: Home / Self Care | Payer: Medicare Other | Source: Ambulatory Visit | Attending: Family Medicine | Admitting: Family Medicine

## 2020-12-18 DIAGNOSIS — Z9049 Acquired absence of other specified parts of digestive tract: Secondary | ICD-10-CM | POA: Diagnosis not present

## 2020-12-18 DIAGNOSIS — K5750 Diverticulosis of both small and large intestine without perforation or abscess without bleeding: Secondary | ICD-10-CM | POA: Diagnosis not present

## 2020-12-18 DIAGNOSIS — I700 Atherosclerosis of aorta: Secondary | ICD-10-CM | POA: Diagnosis not present

## 2020-12-18 DIAGNOSIS — R748 Abnormal levels of other serum enzymes: Secondary | ICD-10-CM

## 2020-12-18 DIAGNOSIS — N281 Cyst of kidney, acquired: Secondary | ICD-10-CM | POA: Diagnosis not present

## 2020-12-18 DIAGNOSIS — K858 Other acute pancreatitis without necrosis or infection: Secondary | ICD-10-CM

## 2020-12-18 MED ORDER — IOPAMIDOL (ISOVUE-300) INJECTION 61%
100.0000 mL | Freq: Once | INTRAVENOUS | Status: AC | PRN
  Administered 2020-12-18: 100 mL via INTRAVENOUS

## 2020-12-23 ENCOUNTER — Other Ambulatory Visit: Payer: Self-pay | Admitting: *Deleted

## 2020-12-23 MED ORDER — ONDANSETRON HCL 4 MG PO TABS: 4.0000 mg | ORAL_TABLET | Freq: Three times a day (TID) | ORAL | 0 refills | Status: DC | PRN

## 2020-12-28 ENCOUNTER — Telehealth: Payer: Self-pay | Admitting: *Deleted

## 2020-12-28 NOTE — Telephone Encounter (Signed)
Received call from patient.   Reports that she was noted to be having x2 episodes of tremors over the weekend.   States that she feels cold all over and shaky. States that she is unable to stand or ambulate during episode. Reports that episodes last about 5 minutes, then spontaneously resolve. Reports that family is concerned about polypharmacy.   Advised that OV will be required for evaluation. Advised if episode noted again, she should seek evaluation at [facility].   Agreeable to plan. Appointment scheduled.

## 2020-12-31 ENCOUNTER — Ambulatory Visit (INDEPENDENT_AMBULATORY_CARE_PROVIDER_SITE_OTHER): Payer: Medicare Other | Admitting: Nurse Practitioner

## 2020-12-31 ENCOUNTER — Encounter: Payer: Self-pay | Admitting: Nurse Practitioner

## 2020-12-31 ENCOUNTER — Other Ambulatory Visit: Payer: Self-pay

## 2020-12-31 VITALS — BP 108/72 | HR 85 | Temp 98.7°F | Ht 60.0 in | Wt 148.0 lb

## 2020-12-31 DIAGNOSIS — F410 Panic disorder [episodic paroxysmal anxiety] without agoraphobia: Secondary | ICD-10-CM | POA: Diagnosis not present

## 2020-12-31 MED ORDER — CLONAZEPAM 0.5 MG PO TABS: 0.5000 mg | ORAL_TABLET | Freq: Every day | ORAL | 0 refills | Status: DC | PRN

## 2020-12-31 NOTE — Progress Notes (Signed)
Subjective:    Patient ID: Jackie Lynch, female    DOB: [birth year], 80 y.o.   MRN: [id number]  HPI: Jackie Lynch is a 80 y.o. female presenting with friend, Vaughan Basta, for tremors and constipation.  Chief Complaint  Patient presents with   Tremors    Thinking about the love ones that have died, causing her to have tremors, having constipation for 6 mos   Anxiety   ANXIETY/STRESS Patients reports 2 episodes within the past week where she was shaking from head to toe. The first episode was Friday evening when she was laying on her cough and worrying about people in her life.  She began shaking "from head to toe" and took 1 of her Valium which did not help very much.  The second episode happened Saturday at her nephew's wake.  She denies shortness of breath or chest pain during the shaking episodes, but does feel her heart was racing.  She tried the Valium again on Saturday without much benefit.  Duration: chronic, recently worsening Anxious mood: yes  Excessive worrying: yes Irritability: yes  Sweating: no Nausea: no Palpitations:no Hyperventilation: no Panic attacks: yes Agoraphobia: no Obscessions/compulsions: no Depressed mood: yes Depression screen [facility] 2/9 12/31/2020 12/01/2020 02/21/2018 08/24/2017 06/19/2017  Decreased Interest 3 0 0 0 0  Down, Depressed, Hopeless 2 0 0 0 0  PHQ - 2 Score 5 0 0 0 0  Altered sleeping 1 - - - -  Tired, decreased energy 3 - - - -  Change in appetite 3 - - - -  Feeling bad or failure about yourself  3 - - - -  Trouble concentrating 0 - - - -  Moving slowly or fidgety/restless 3 - - - -  Suicidal thoughts 0 - - - -  PHQ-9 Score 18 - - - -  Difficult doing work/chores Very difficult - - - -    GAD 7 : Generalized Anxiety Score 12/31/2020  Nervous, Anxious, on Edge 1  Control/stop worrying 2  Worry too much - different things 3  Trouble relaxing 3  Restless 1  Easily annoyed or irritable 1  Afraid - awful might happen 1  Total GAD 7  Score 12  Anxiety Difficulty Not difficult at all    Anhedonia: yes Weight changes: no Insomnia: no   Hypersomnia: no Fatigue/loss of energy: yes Feelings of worthlessness: yes Feelings of guilt: yes Impaired concentration/indecisiveness: no Suicidal ideations: no  Crying spells: yes Recent Stressors/Life Changes: yes   Relationship problems: no   Family stress: yes     Financial stress: no    Job stress: no    Recent death/loss: yes   Allergies  Allergen Reactions   Bee Venom Swelling    Outpatient Encounter Medications as of 12/31/2020  Medication Sig   clonazePAM (KLONOPIN) 0.5 MG tablet Take 1 tablet (0.5 mg total) by mouth daily as needed for anxiety.   cloNIDine (CATAPRES) 0.1 MG tablet TAKE 1 TABLET BY MOUTH 3  TIMES DAILY FOR ELEVATED  BLOOD PRESSURE   esomeprazole (NEXIUM) 40 MG capsule Take 1 capsule (40 mg total) by mouth in the morning and at bedtime.   fenofibrate 160 MG tablet TAKE 1 TABLET BY MOUTH  DAILY   hydrochlorothiazide (HYDRODIURIL) 25 MG tablet TAKE 1 TABLET BY MOUTH  DAILY   hydrocortisone 2.5 % cream Apply topically 2 (two) times daily.   losartan (COZAAR) 100 MG tablet TAKE 1 TABLET BY MOUTH  DAILY   methimazole (TAPAZOLE) 5  MG tablet Take by mouth.   metoprolol tartrate (LOPRESSOR) 50 MG tablet TAKE 1 TABLET BY MOUTH  TWICE DAILY   ondansetron (ZOFRAN) 4 MG tablet Take 1 tablet (4 mg total) by mouth every 8 (eight) hours as needed for nausea or vomiting.   pantoprazole (PROTONIX) 40 MG tablet TAKE 1 TABLET BY MOUTH  TWICE DAILY   pravastatin (PRAVACHOL) 80 MG tablet TAKE 1 TABLET BY MOUTH  DAILY   Probiotic Product (PROBIOTIC FORMULA PO) Take 1 tablet by mouth daily.   sucralfate (CARAFATE) 1 g tablet TAKE 1 TABLET( 1 GRAM) BY MOUTH IN THE MORNING, AT NOON, AND AT BEDTIME   traMADol (ULTRAM) 50 MG tablet Take 50 mg by mouth every 8 (eight) hours as needed.   [DISCONTINUED] diazepam (VALIUM) 5 MG tablet Take 1 tablet (5 mg total) by mouth every 8  (eight) hours as needed for anxiety.   [DISCONTINUED] gabapentin (NEURONTIN) 100 MG capsule TAKE 1 CAPSULE(100 MG) BY MOUTH TWICE DAILY   No facility-administered encounter medications on file as of 12/31/2020.    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   Vitamin D deficiency 03/17/2017   Hypertension 03/16/2017   Controlled type 2 diabetes mellitus without complication, without long-term current use of insulin ([place]) 03/16/2017   Hyperlipidemia 03/16/2017   Hyperthyroidism 03/16/2017   IBS (irritable bowel syndrome) 03/16/2017   Osteoarthritis of right knee 08/31/2012   Renal insufficiency 11/17/2011   Osteoarthritis of left hip 11/14/2011    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 Interim medical history since our last visit reviewed.  Review of Systems Per HPI unless specifically indicated above     Objective:    BP 108/72   Pulse 85   Temp 98.7 F (37.1 C)   Ht 5' (1.524 m)   Wt 148 lb (67.1 kg)   SpO2 96%   BMI 28.90 kg/m   Wt Readings from Last 3 Encounters:  12/31/20 148 lb (67.1 kg)  12/01/20 152 lb (68.9 kg)  11/16/20 157 lb (71.2 kg)    Physical Exam Vitals and nursing note reviewed.  Constitutional:      General: She is not in acute distress.    Appearance: Normal appearance. She is not toxic-appearing.  HENT:     Head: Normocephalic and atraumatic.  Cardiovascular:     Rate and Rhythm: Normal rate and regular rhythm.     Heart sounds: Normal heart sounds. No murmur  heard. Pulmonary:     Effort: Pulmonary effort is normal. No respiratory distress.     Breath sounds: Normal breath sounds. No wheezing, rhonchi or rales.  Musculoskeletal:     Cervical back: Normal range of motion. No rigidity.     Right lower leg: No edema.     Left lower leg: No edema.  Lymphadenopathy:     Cervical: No cervical adenopathy.  Skin:    General: Skin is warm and dry.     Capillary Refill: Capillary refill  takes less than 2 seconds.     Coloration: Skin is not jaundiced or pale.     Findings: No erythema.  Neurological:     Mental Status: She is alert and oriented to person, place, and time.     Cranial Nerves: No cranial nerve deficit.     Sensory: No sensory deficit.     Motor: No weakness.     Coordination: Coordination normal. Finger-Nose-Finger Test and Heel to Shin Test normal.     Gait: Gait normal.     Comments: No tremoring of hands when held outstretched  Psychiatric:        Mood and Affect: Mood normal.        Behavior: Behavior normal.        Thought Content: Thought content normal.        Judgment: Judgment normal.      Assessment & Plan:  1. Panic attack Acute on chronic.  The patients symptoms sound most consistent with panic episodes and underlying anxiety/worry.  She is neurologically intact today and there is no tremor on examination.  GAD-7 and PHQ-9 are slightly elevated today.  Patient is not interested in daily medication to help with mood, would like to use something that "does not sedate" her when she is feeling anxious.  Will STOP Valium and start clonazepam 0.5 mg daily as needed to help with panic. Pt is aware that this is not to be used as a daily medication and reports she will monitor herself closely after starting this medication.  I did tell her I think it would be okay for her to travel to [place] to visit her sister who is terminally ill as long as she takes breaks every 2 hours while driving to get up and stretch her legs.     Follow  up plan: Return if symptoms worsen or fail to improve.

## 2021-01-06 DIAGNOSIS — M25552 Pain in left hip: Secondary | ICD-10-CM | POA: Diagnosis not present

## 2021-01-06 DIAGNOSIS — M25551 Pain in right hip: Secondary | ICD-10-CM | POA: Diagnosis not present

## 2021-01-31 ENCOUNTER — Other Ambulatory Visit: Payer: Self-pay | Admitting: Family Medicine

## 2021-02-04 ENCOUNTER — Telehealth: Payer: Self-pay | Admitting: Family Medicine

## 2021-02-04 NOTE — Telephone Encounter (Signed)
Patient called to seek advice for painful acid reflux flareup; stomach burning (stated it feels like it's on fire) and hurting since last night.   Please advise at [phone]. Please leave a message if patient doesn't answer.

## 2021-02-04 NOTE — Telephone Encounter (Signed)
Call placed to patient to inquire.   Reports that she is having increased pain from heartburn and has not had BM in 2 weeks.   Reports that she is taking Nexium BID, but she is taking Carafate intermittently. Advised to take Carafate QID, add Maalox or Mylanta for reflux/ indigestion.  Advised to use Magnesium Citrate and enema x1 to move bowels. Advised to then begin Miralax QD to keep bowels moving.   PCP made aware.

## 2021-02-04 NOTE — Telephone Encounter (Signed)
Pt called,no answer.

## 2021-02-08 ENCOUNTER — Other Ambulatory Visit: Payer: Self-pay

## 2021-02-08 ENCOUNTER — Encounter: Payer: Self-pay | Admitting: Obstetrics and Gynecology

## 2021-02-08 ENCOUNTER — Ambulatory Visit (INDEPENDENT_AMBULATORY_CARE_PROVIDER_SITE_OTHER): Payer: Medicare Other | Admitting: Obstetrics and Gynecology

## 2021-02-08 VITALS — BP 148/105 | HR 108 | Wt 145.0 lb

## 2021-02-08 DIAGNOSIS — N993 Prolapse of vaginal vault after hysterectomy: Secondary | ICD-10-CM

## 2021-02-08 DIAGNOSIS — N8110 Cystocele, unspecified: Secondary | ICD-10-CM

## 2021-02-08 DIAGNOSIS — N393 Stress incontinence (female) (male): Secondary | ICD-10-CM | POA: Diagnosis not present

## 2021-02-08 DIAGNOSIS — R350 Frequency of micturition: Secondary | ICD-10-CM | POA: Diagnosis not present

## 2021-02-08 LAB — POCT URINALYSIS DIPSTICK
Appearance: ABNORMAL
Blood, UA: NEGATIVE
Glucose, UA: NEGATIVE
Leukocytes, UA: NEGATIVE
Nitrite, UA: NEGATIVE
Protein, UA: POSITIVE — AB
Spec Grav, UA: 1.02 (ref 1.010–1.025)
Urobilinogen, UA: 2 E.U./dL — AB
pH, UA: 5.5 (ref 5.0–8.0)

## 2021-02-08 NOTE — Progress Notes (Signed)
[facility] New Patient Evaluation and Consultation  Referring Provider: [name], MD PCP: Susy Frizzle, MD Date of Service: 02/08/2021  SUBJECTIVE Chief Complaint: New Patient (Initial Visit)- prolapse  History of Present Illness: Jackie Lynch is a 80 y.o. White or Caucasian female seen in consultation at the request of Dr. Kennon Rounds for evaluation of prolapse.    Review of records significant for: Has a history of TVH with A/P repair. Noted enterocele/ bladder prolapse on exam.   Urinary Symptoms: Leaks urine with laughing Leaks occasionally Pad use: liners/ mini-pads per day.   She is bothered by her UI symptoms.  Day time voids: every few hours.  Nocturia: 0 times per night to void. Voiding dysfunction: she empties her bladder well.  does not use a catheter to empty bladder.  When urinating, she feels a weak stream, dribbling after finishing, and the need to urinate multiple times in a row  UTIs:  0  UTI's in the last year.   Denies history of blood in urine and kidney or bladder stones  Pelvic Organ Prolapse Symptoms:                  She Admits to a feeling of a bulge the vaginal area. It has been present for several years She Admits to seeing a bulge.  This bulge is bothersome. S/p A/P repair 2009- pt unsure if she had her hysterectomy at the same time  Bowel Symptom: Bowel movements: 2 time(s) per week Stool consistency: hard Straining: yes.  Splinting: yes.  Incomplete evacuation: no.  She Denies accidental bowel leakage / fecal incontinence Bowel regimen: miralax Last colonoscopy: Date 2018  Sexual Function Sexually active: no.    Pelvic Pain Admits to pelvic pain- when having bowel movements  Past Medical History:  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    GERD (gastroesophageal reflux disease)    H/O  osteopenia    Hyperlipidemia    Hypertension    Hyperthyroidism    Nocturia    PONV (postoperative nausea and vomiting)    Renal cyst, congenital, right      Past Surgical History:   Past Surgical History:  Procedure Laterality Date   ABDOMINAL HYSTERECTOMY     CHOLECYSTECTOMY     JOINT REPLACEMENT     KNEE ARTHROSCOPY     right and left   TOTAL HIP ARTHROPLASTY  11/14/2011   Procedure: TOTAL HIP ARTHROPLASTY;  Surgeon: Alta Corning, MD;  Location: [place];  Service: Orthopedics;  Laterality: Left;   TOTAL KNEE ARTHROPLASTY Right 08/31/2012   Procedure: TOTAL KNEE ARTHROPLASTY;  Surgeon: Alta Corning, MD;  Location: [place];  Service: Orthopedics;  Laterality: Right;   TUBAL LIGATION       Past OB/GYN History: OB History     Gravida  2   Para  2   Term  2   Preterm  0   AB  0   Living  2      SAB  0   IAB  0   Ectopic  0   Multiple  0   Live Births  2          Svd x2   Medications: She has a current medication list which includes the following prescription(s): clonazepam, clonidine, esomeprazole, fenofibrate, hydrochlorothiazide, losartan, metoprolol tartrate, ondansetron,  pravastatin, bacillus coagulans-inulin, sucralfate, and tramadol.   Allergies: Patient is allergic to bee venom.   Social History:  Social History   Tobacco Use   Smoking status: Never   Smokeless tobacco: Never  Vaping Use   Vaping Use: Never used  Substance Use Topics   Alcohol use: No   Drug use: No   Husband recent passed away.   Family History:   Family History  Problem Relation Age of Onset   Diabetes Sister    Breast cancer Sister    Heart disease Father    Hypertension Father    Anesthesia problems Neg Hx      Review of Systems: Review of Systems  Constitutional:  Positive for weight loss. Negative for fever and malaise/fatigue.  Respiratory:  Negative for cough, shortness of breath and wheezing.   Cardiovascular:  Negative for chest pain, palpitations  and leg swelling.  Gastrointestinal:  Negative for abdominal pain and blood in stool.  Genitourinary:  Negative for dysuria.  Musculoskeletal:  Negative for myalgias.  Skin:  Negative for rash.  Neurological:  Negative for dizziness and headaches.  Endo/Heme/Allergies:  Does not bruise/bleed easily.  Psychiatric/Behavioral:  Negative for depression. The patient is nervous/anxious.     OBJECTIVE Physical Exam: Vitals:   02/08/21 1004  BP: (!) 148/105  Pulse: (!) 108  Weight: 145 lb (65.8 kg)    Physical Exam Constitutional:      General: She is not in acute distress. Pulmonary:     Effort: Pulmonary effort is normal.  Abdominal:     General: There is no distension.     Palpations: Abdomen is soft.     Tenderness: There is no abdominal tenderness. There is no rebound.  Musculoskeletal:        General: No swelling. Normal range of motion.  Skin:    General: Skin is warm and dry.     Findings: No rash.  Neurological:     Mental Status: She is alert and oriented to person, place, and time.  Psychiatric:        Mood and Affect: Mood normal.        Behavior: Behavior normal.     GU / Detailed Urogynecologic Evaluation:  Pelvic Exam: Normal external female genitalia; Bartholin's and Skene's glands normal in appearance; urethral meatus normal in appearance, no urethral masses or discharge.   CST: negative  s/p hysterectomy: Speculum exam reveals normal vaginal mucosa with  atrophy and normal vaginal cuff.  Adnexa no mass, fullness, tenderness.    With apex supported, anterior compartment defect was reduced  Pelvic floor strength I/V  Pelvic floor musculature: Right levator non-tender, Right obturator non-tender, Left levator non-tender, Left obturator non-tender  POP-Q:   POP-Q  -0.5                                            Aa   -0.5                                           Ba  -5  C   3                                             Gh  2                                            Pb  7                                            tvl   -3                                            Ap  -3                                            Bp                                                 D     Rectal Exam:  Normal external rectum  Post-Void Residual (PVR) by Bladder Scan: In order to evaluate bladder emptying, we discussed obtaining a postvoid residual and she agreed to this procedure.  Procedure: The ultrasound unit was placed on the patient's abdomen in the suprapubic region after the patient had voided. A PVR of 33 ml was obtained by bladder scan.  Laboratory Results: POC urine: +protein, trace ketones   ASSESSMENT AND PLAN Ms. Schicker is a 80 y.o. with:  1. Prolapse of anterior vaginal wall   2. Vaginal vault prolapse after hysterectomy   3. SUI (stress urinary incontinence, female)    Stage II anterior, Stage I posterior, Stage I apical prolapse -For treatment of pelvic organ prolapse, we discussed options for management including expectant management, conservative management, and surgical management, such as Kegels, a pessary, pelvic floor physical therapy, and specific surgical procedures. - She is interested in surgical treatment (anterior repair with sacrospinous ligament fixation).   2. SUI For treatment of stress urinary incontinence,  non-surgical options include expectant management, weight loss, physical therapy, as well as a pessary.  Surgical options include a midurethral sling, Burch urethropexy, and transurethral injection of a bulking agent. - She would be interested in concomitant sling. Will have her proceed with urodynamic testing  Return for urodynamic testing   Jaquita Folds, MD   Medical Decision Making:  - Reviewed/ ordered a clinical laboratory test - Review and summation of prior records

## 2021-02-08 NOTE — Patient Instructions (Signed)
You have a stage 2 (out of 4) prolapse.  We discussed the fact that it is not life threatening but there are several treatment options. For treatment of pelvic organ prolapse, we discussed options for management including expectant management, conservative management, and surgical management, such as Kegels, a pessary, pelvic floor physical therapy, and specific surgical procedures.      URODYNAMICS (UDS) TEST INFORMATION  IMPORTANT: Please try to arrive with a comfortably full bladder!    What is UDS? Urodynamics is a bladder test used to evaluate how your bladder and urethra (tube you urinate out of) work to help find out the cause of your bladder symptoms and evaluate your bladder function in order to make the best treatment plan for you.   What to expect? A nurse will perform the test and will be with you during the entire exam. First we will have to empty your bladder on a special toilet.  After you have emptied your bladder, very small catheters (plastic tubing) will be placed into your bladder and into your vagina (or rectum). These special small catheters measure pressure to help measure your bladder function.  Your bladder will be gently filled with water and you will be asked to cough and strain at several different points during the test.   You will then be asked to empty your bladder in the special toilet with the catheters in place. Most patients can urinate (pee) easily with the catheters in place since the catheters are so small. In total this procedure lasts about 45 minutes to 1 hour.  After your test is completed, you will return (or possibly be seen the same day) to review the results, talk about treatment options and make a plan moving forward.  

## 2021-02-11 ENCOUNTER — Telehealth (INDEPENDENT_AMBULATORY_CARE_PROVIDER_SITE_OTHER): Payer: Medicare Other | Admitting: Nurse Practitioner

## 2021-02-11 ENCOUNTER — Other Ambulatory Visit: Payer: Self-pay

## 2021-02-11 ENCOUNTER — Encounter: Payer: Self-pay | Admitting: Nurse Practitioner

## 2021-02-11 DIAGNOSIS — F410 Panic disorder [episodic paroxysmal anxiety] without agoraphobia: Secondary | ICD-10-CM

## 2021-02-11 DIAGNOSIS — K219 Gastro-esophageal reflux disease without esophagitis: Secondary | ICD-10-CM

## 2021-02-11 DIAGNOSIS — K644 Residual hemorrhoidal skin tags: Secondary | ICD-10-CM | POA: Diagnosis not present

## 2021-02-11 DIAGNOSIS — U071 COVID-19: Secondary | ICD-10-CM | POA: Diagnosis not present

## 2021-02-11 MED ORDER — ONDANSETRON HCL 4 MG PO TABS: 4.0000 mg | ORAL_TABLET | Freq: Three times a day (TID) | ORAL | 0 refills | Status: AC | PRN

## 2021-02-11 MED ORDER — MOLNUPIRAVIR EUA 200MG CAPSULE: 4.0000 | ORAL_CAPSULE | Freq: Two times a day (BID) | ORAL | 0 refills | Status: AC

## 2021-02-11 MED ORDER — PANTOPRAZOLE SODIUM 40 MG PO TBEC: 40.0000 mg | DELAYED_RELEASE_TABLET | Freq: Every day | ORAL | 1 refills | Status: DC

## 2021-02-11 MED ORDER — HYDROCORTISONE 2.5 % EX CREA: TOPICAL_CREAM | Freq: Two times a day (BID) | CUTANEOUS | 2 refills | Status: DC

## 2021-02-11 NOTE — Progress Notes (Signed)
Subjective:    Patient ID: [name], female    DOB: 1941/03/22, 80 y.o.   MRN: [id number]  HPI: Jackie Lynch is a 80 y.o. female presenting virtually for coughing.  Chief Complaint  Patient presents with   Illness    Has covid, confirmed on Tuesday, stomach burning, acid reflux and  coughing. Pt is asking that nexium med is changed, does not help with acid   UPPER RESPIRATORY TRACT INFECTION Onset: 02/09/2021 COVID-19 testing history: tested positive yesterday COVID-19 vaccination status: 1 J&J vaccine Fever: no Cough: yes Shortness of breath: no Wheezing: no Chest pain: yes, with cough Chest tightness: no Chest congestion: yes Nasal congestion: no Runny nose: yes Post nasal drip: yes Sneezing: no Sore throat: yes Swollen glands: no Sinus pressure: no Headache: yes Face pain: no Toothache: no Ear pain: no  Ear pressure: no  Eyes red/itching:no Eye drainage/crusting: no  Nausea: yes  Vomiting: no Diarrhea: no  Change in appetite: yes ; decreased Loss of taste/smell: no  Rash: no Fatigue: yes Sick contacts: no Strep contacts: no  Context: stable Recurrent sinusitis: no Treatments attempted: vapo cool, Dayquil, nyquil Relief with OTC medications: yes  GERD Patient reports her medications are not working for the burning.  It is unclear if she is taking the Nexium or Carafate consistently or as prescribed. She has had similar symptoms this year and was supposed to follow up with GI provider for possible EGD, however this has not been done.  GERD control status: uncontrolled Satisfied with current treatment? no Medication side effects: no  Medication compliance: poor compliance  She reports the clonazepam is helping with her panic and is requesting a refill of it today.  She has not completely run out yet.   Allergies  Allergen Reactions   Bee Venom Swelling    Outpatient Encounter Medications as of 02/11/2021  Medication Sig   clonazePAM  (KLONOPIN) 0.5 MG tablet Take 1 tablet (0.5 mg total) by mouth daily as needed for anxiety.   cloNIDine (CATAPRES) 0.1 MG tablet TAKE 1 TABLET BY MOUTH 3  TIMES DAILY FOR ELEVATED  BLOOD PRESSURE   fenofibrate 160 MG tablet TAKE 1 TABLET BY MOUTH  DAILY   hydrochlorothiazide (HYDRODIURIL) 25 MG tablet TAKE 1 TABLET BY MOUTH  DAILY   losartan (COZAAR) 100 MG tablet TAKE 1 TABLET BY MOUTH  DAILY   metoprolol tartrate (LOPRESSOR) 50 MG tablet TAKE 1 TABLET BY MOUTH  TWICE DAILY   molnupiravir EUA 200 mg CAPS Take 4 capsules (800 mg total) by mouth 2 (two) times daily for 5 days.   pantoprazole (PROTONIX) 40 MG tablet Take 1 tablet (40 mg total) by mouth daily.   pravastatin (PRAVACHOL) 80 MG tablet TAKE 1 TABLET BY MOUTH  DAILY   Probiotic Product (PROBIOTIC FORMULA PO) Take 1 tablet by mouth daily.   sucralfate (CARAFATE) 1 g tablet TAKE 1 TABLET( 1 GRAM) BY MOUTH IN THE MORNING, AT NOON, AND AT BEDTIME   traMADol (ULTRAM) 50 MG tablet Take 50 mg by mouth every 8 (eight) hours as needed.   [DISCONTINUED] esomeprazole (NEXIUM) 40 MG capsule Take 1 capsule (40 mg total) by mouth in the morning and at bedtime.   [DISCONTINUED] ondansetron (ZOFRAN) 4 MG tablet Take 1 tablet (4 mg total) by mouth every 8 (eight) hours as needed for nausea or vomiting.   hydrocortisone 2.5 % cream Apply topically 2 (two) times daily.   ondansetron (ZOFRAN) 4 MG tablet Take 1 tablet (4  mg total) by mouth every 8 (eight) hours as needed for nausea or vomiting.   No facility-administered encounter medications on file as of 02/11/2021.    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   Vitamin D deficiency 03/17/2017   Hypertension 03/16/2017   Controlled type 2 diabetes mellitus without complication, without long-term current use of insulin ([place]) 03/16/2017   Hyperlipidemia 03/16/2017   Hyperthyroidism 03/16/2017   IBS (irritable bowel syndrome) 03/16/2017   Osteoarthritis of  right knee 08/31/2012   Renal insufficiency 11/17/2011   Osteoarthritis of left hip 11/14/2011    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 Interim medical history since our last visit reviewed.  Review of Systems Per HPI unless specifically indicated above     Objective:    There were no vitals taken for this visit.  Wt Readings from Last 3 Encounters:  02/08/21 145 lb (65.8 kg)  12/31/20 148 lb (67.1 kg)  12/01/20 152 lb (68.9 kg)    Physical Exam Physical examination unable to be performed due to lack of equipment.      Assessment & Plan:  1. COVID-19 Acute.  Symptoms sound mild, however I was unable to visualize the patient today.  Start molnupiravir -not fully vaccinated and high risk for severe disease.   Encouraged over-the-counter guaifenesin, plenty of hydration, rest.  With any sudden onset new chest pain, dizziness, sweating, or shortness of breath, go to ED.  - molnupiravir EUA 200 mg CAPS; Take 4 capsules (800 mg total) by mouth 2 (two) times daily for 5 days.  Dispense: 40 capsule; Refill: 0  2. Gastroesophageal reflux disease, unspecified whether esophagitis present Chronic.  It sounds like the patient is not taking Carafate regularly along with PPI.  I recommended taking both regularly to prevent acid reflux.  We will give refill of Zofran to use as needed for nausea.  I recommended she follow-up with her GI provider for possible upper endoscopy as this was recommended by her PCP earlier this year.  Her symptoms should be controlled if she is  taking these medications.  - ondansetron (ZOFRAN) 4 MG tablet; Take 1 tablet (4 mg total) by mouth every 8 (eight) hours as needed for nausea or vomiting.  Dispense: 60 tablet; Refill: 0 - pantoprazole (PROTONIX) 40 MG tablet; Take 1 tablet (40 mg total) by mouth daily.  Dispense: 30 tablet; Refill: 1  3. Panic attack Reports this has improved somewhat with clonazepam.  She is asking me for a refill today-I advised her to discuss this with her PCP to determine if this is something that should be continued long-term.  She still has a few pills left and does not take daily.  I will also refer to psychology for therapy today.  - Ambulatory referral to Psychology  4. External hemorrhoid Treat with hydrocortisone cream-follow-up if this does not help.  - hydrocortisone 2.5 % cream; Apply topically 2 (two) times daily.  Dispense: 30 g; Refill: 2  Follow up plan: Return if symptoms worsen or fail to improve.  This visit was completed via telephone due to the restrictions of the COVID-19 pandemic. All issues as above were discussed and addressed but no physical exam was performed. If it was felt that the patient should be evaluated in the office, they were directed there. The patient verbally consented to this visit. Patient was unable to complete an audio/visual visit due to Lack of equipment. Location of the patient: home Location of the provider: work Those involved with this call:  Provider: Noemi Chapel, DNP, FNP-C CMA: Annabelle Harman, CMA Front Desk/Registration: Vevelyn Pat  Time spent on call:  14 minutes on the phone discussing health concerns. 20 minutes total spent in review of patient's record and preparation of their chart. I verified patient identity using two factors (patient name and date of birth). Patient consents verbally to being seen via telemedicine visit today.

## 2021-02-15 ENCOUNTER — Other Ambulatory Visit: Payer: Self-pay | Admitting: *Deleted

## 2021-02-15 NOTE — Telephone Encounter (Signed)
Received call from patient granddaughter, Jackie Lynch.   Requested refill on Clonazepam.  Ok to refill??  Last office visit 02/11/2021.  Last refill 12/31/2020.

## 2021-02-16 MED ORDER — CLONAZEPAM 0.5 MG PO TABS: 0.5000 mg | ORAL_TABLET | Freq: Every day | ORAL | 0 refills | Status: DC | PRN

## 2021-02-25 ENCOUNTER — Encounter: Payer: Self-pay | Admitting: Family Medicine

## 2021-02-25 ENCOUNTER — Other Ambulatory Visit: Payer: Self-pay

## 2021-02-25 ENCOUNTER — Encounter: Payer: Self-pay | Admitting: *Deleted

## 2021-02-25 ENCOUNTER — Ambulatory Visit (INDEPENDENT_AMBULATORY_CARE_PROVIDER_SITE_OTHER): Payer: Medicare Other | Admitting: Family Medicine

## 2021-02-25 VITALS — BP 138/84 | HR 78 | Temp 98.2°F | Resp 16 | Ht 60.0 in | Wt 134.0 lb

## 2021-02-25 DIAGNOSIS — R1013 Epigastric pain: Secondary | ICD-10-CM

## 2021-02-25 DIAGNOSIS — K219 Gastro-esophageal reflux disease without esophagitis: Secondary | ICD-10-CM

## 2021-02-25 DIAGNOSIS — R110 Nausea: Secondary | ICD-10-CM

## 2021-02-25 DIAGNOSIS — R634 Abnormal weight loss: Secondary | ICD-10-CM | POA: Diagnosis not present

## 2021-02-25 MED ORDER — CLONAZEPAM 0.5 MG PO TABS: 0.5000 mg | ORAL_TABLET | Freq: Every day | ORAL | 0 refills | Status: DC | PRN

## 2021-02-25 NOTE — Progress Notes (Signed)
Subjective:    Patient ID: Jackie Lynch, female    DOB: December 07, 1940, 80 y.o.   MRN: [id number]  HPI  11/16/20 Patient reports daily acid reflux.  She reports a burning sensation that comes up in her chest at times.  It radiates from below the xiphoid process up into her throat.  She also reports indigestion frequently when she eats.  She also reports frequent "salty wet burps".  It hurts worse when she lays down at night.  She has been taking Protonix twice a day per her report and states that this is not helping.  She denies any melena or hematochezia.  She denies any fevers or chills.  She denies any pleurisy or hemoptysis.  At that time, my plan was: Patient states that she has been taking Protonix every day.  I recommended that she switch to Nexium 40 mg twice a day because she states that that worked better for her in the past.  I also want her to use sucralfate 1 g p.o. q. ACH S..  Meanwhile check CBC, CMP, lipase.  Check H. pylori test.  Reassess in 2 weeks.  If not improving, will need EGD.  Also use Zofran 4 mg p.o. every 8 hours as needed nausea  12/01/20  Patient is a very sweet 80 year old Caucasian female here today for follow-up.  She has a past medical history of gastritis and GERD.  At her last visit I started her on twice daily Nexium and sucralfate 4 times daily and also gave her Zofran.  She states that the heartburn is some better.  However she continues to have a lot of nausea and she is asking for refill on the Zofran.  It seems like more of her symptoms at this point is constant nausea and upset stomach.  It seems like some of the burning and reflux have improved on the dual therapy.  She continues to deny any fever or melena or hematochezia.  On her lab work there was a mild elevation in her lipase which I do not believe is clinically significant.  My suspicions are possibly an ulcer versus gastritis versus IBS related to stress given the death of her husband versus less likely  malignancy in the stomach.  However at this point, her symptoms persist.  We discussed today whether or not she wanted to see her gastroenterologist for an EGD.  She states that this would make her feel more relieved if she can get "a clear bill of health".  At that time, my plan was:  Differential diagnosis includes GERD, gastritis, peptic ulcer disease, IBS related to stress given the recent passing of her husband, and less likely stomach malignancy.  However given the fact that her symptoms have persisted despite being on daily PPI therapy and now twice daily PPI therapy in addition to sucralfate, I will set up an appointment for her to see her gastroenterologist to discuss whether or not she needs an EGD to evaluate further to rule out underlying pathology in the stomach.  Continue Nexium and sucralfate for the time being.  I will refill her Zofran at her request.  I will also repeat her lipase.  If the lipase is persistently elevated we will need to image the pancreas.  02/25/21 CT scan in June was normal.  Wt Readings from Last 3 Encounters:  02/25/21 134 lb (60.8 kg)  02/08/21 145 lb (65.8 kg)  12/31/20 148 lb (67.1 kg)   Patient is lost considerable weight since I  last saw her.  She is lost 14 pounds.  She states that she is afraid to eat.  The reason she is afraid to eat is because she is "afraid to hurt".  She reports 2 episodes of severe burning pain in her epigastric area over the last few weeks.  She denies any melena.  She denies any hematochezia.  She states that she has been taking Nexium twice a day 40 mg as prescribed along with sucralfate 3 times a day.  Despite this she continues to have occasional epigastric pain and epigastric burning and a gnawing "hunger pain" in the subxiphoid area.  She has not yet seen the GI doctor.  She is not sure if she missed their phone call however no one has contacted her about an EGD.  She seems to be very anxious this morning.  She is very nervous.  She  denies any depression.  She denies any sadness.  But she does report to feeling anxious all the time.  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    GERD (gastroesophageal reflux disease)    H/O osteopenia    Hyperlipidemia    Hypertension    Hyperthyroidism    Nocturia    PONV (postoperative nausea and vomiting)    Renal cyst, congenital, right    Past Surgical History:  Procedure Laterality Date   ABDOMINAL HYSTERECTOMY     CHOLECYSTECTOMY     JOINT REPLACEMENT     KNEE ARTHROSCOPY     right and left   TOTAL HIP ARTHROPLASTY  11/14/2011   Procedure: TOTAL HIP ARTHROPLASTY;  Surgeon: Alta Corning, MD;  Location: [facility];  Service: Orthopedics;  Laterality: Left;   TOTAL KNEE ARTHROPLASTY Right 08/31/2012   Procedure: TOTAL KNEE ARTHROPLASTY;  Surgeon: Alta Corning, MD;  Location: [facility];  Service: Orthopedics;  Laterality: Right;   TUBAL LIGATION     Current Outpatient Medications on File Prior to Visit  Medication Sig Dispense Refill   clonazePAM (KLONOPIN) 0.5 MG tablet Take 1 tablet (0.5 mg total) by mouth daily as needed for anxiety. 20 tablet 0   cloNIDine (CATAPRES) 0.1 MG tablet TAKE 1 TABLET BY MOUTH 3  TIMES DAILY FOR ELEVATED  BLOOD PRESSURE 270 tablet 3   fenofibrate 160 MG tablet TAKE 1 TABLET BY MOUTH  DAILY 90 tablet 3   hydrochlorothiazide (HYDRODIURIL) 25 MG tablet TAKE 1 TABLET BY MOUTH  DAILY 90 tablet 3   hydrocortisone 2.5 % cream Apply topically 2 (two) times daily. 30 g 2   losartan (COZAAR) 100 MG tablet TAKE 1 TABLET BY MOUTH  DAILY 90 tablet 3   metoprolol tartrate (LOPRESSOR) 50 MG tablet TAKE 1 TABLET BY MOUTH  TWICE DAILY 180 tablet 3   ondansetron (ZOFRAN) 4 MG  tablet Take 1 tablet (4 mg total) by mouth every 8 (eight) hours as needed for nausea or vomiting. 60 tablet 0   pantoprazole (PROTONIX) 40 MG tablet Take 1 tablet (40 mg total) by mouth daily. 30 tablet 1   pravastatin (PRAVACHOL) 80 MG tablet TAKE 1 TABLET BY MOUTH  DAILY 90 tablet 3   Probiotic Product (PROBIOTIC FORMULA PO) Take 1  tablet by mouth daily.     sucralfate (CARAFATE) 1 g tablet TAKE 1 TABLET( 1 GRAM) BY MOUTH IN THE MORNING, AT NOON, AND AT BEDTIME 270 tablet 1   traMADol (ULTRAM) 50 MG tablet Take 50 mg by mouth every 8 (eight) hours as needed.     No current facility-administered medications on file prior to visit.   Allergies  Allergen Reactions   Bee Venom Swelling   Social History   Socioeconomic History   Marital status: Widowed    Spouse name: Not on file   Number of children: Not on file   Years of education: Not on file   Highest education level: Not on file  Occupational History   Not on file  Tobacco Use   Smoking status: Never   Smokeless tobacco: Never  Vaping Use   Vaping Use: Never used  Substance and Sexual Activity   Alcohol use: No   Drug use: No   Sexual activity: Never  Other Topics Concern   Not on file  Social History Narrative   Not on file   Social Determinants of Health   Financial Resource Strain: Not on file  Food Insecurity: Not on file  Transportation Needs: Not on file  Physical Activity: Not on file  Stress: Not on file  Social Connections: Not on file  Intimate Partner Violence: Not on file      Review of Systems  All other systems reviewed and are negative.     Objective:   Physical Exam Vitals reviewed. Exam conducted with a chaperone present.  Constitutional:      General: She is not in acute distress.    Appearance: Normal appearance. She is well-developed. She is not ill-appearing, toxic-appearing or diaphoretic.  Cardiovascular:     Rate and Rhythm: Normal rate and regular rhythm.     Pulses: Normal  pulses.     Heart sounds: Normal heart sounds. No murmur heard.   No friction rub. No gallop.  Pulmonary:     Effort: Pulmonary effort is normal. No respiratory distress.     Breath sounds: No decreased breath sounds or wheezing.  Abdominal:     General: Abdomen is flat. Bowel sounds are normal. There is no distension.     Palpations: Abdomen is soft. There is no mass.     Tenderness: There is no abdominal tenderness. There is no guarding or rebound.  Musculoskeletal:     Cervical back: Neck supple.     Right lower leg: No edema.     Left lower leg: No edema.  Lymphadenopathy:     Cervical: No cervical adenopathy.  Neurological:     Mental Status: She is alert.          Assessment & Plan:  Weight loss - Plan: CBC with Differential/Platelet, COMPLETE METABOLIC PANEL WITH GFR, TSH, Ambulatory referral to Gastroenterology  Gastroesophageal reflux disease, unspecified whether esophagitis present - Plan: Ambulatory referral to Gastroenterology  Nausea  Epigastric pain - Plan: Ambulatory referral to Gastroenterology  Patient needs an EGD.  I am very concerned about her weight loss.  Repeat CMP today.  Patient's gallbladder has been removed per her report.  Therefore gallstones would be unlikely.  She is not jaundiced.  There is no evidence of any biliary tract disease on exam.  However I am concerned about the persistent burning sensation in the epigastric area despite the normal CT scan.  Therefore I am going to reach out to GI to contact her for an  EGD as soon as possible.  Check CBC CMP and TSH because of weight loss.  If labs and EGD are normal, I would shift our focus to trying to better manage her anxiety as the anxiety itself could potentially be causing an upset stomach and weight loss due to poor appetite..  Meanwhile I will temporarily refill Klonopin

## 2021-02-26 LAB — CBC WITH DIFFERENTIAL/PLATELET
Absolute Monocytes: 612 cells/uL (ref 200–950)
Basophils Absolute: 68 cells/uL (ref 0–200)
Basophils Relative: 1 %
Eosinophils Absolute: 129 cells/uL (ref 15–500)
Eosinophils Relative: 1.9 %
HCT: 33.8 % — ABNORMAL LOW (ref 35.0–45.0)
Hemoglobin: 11.3 g/dL — ABNORMAL LOW (ref 11.7–15.5)
Lymphs Abs: 3223 cells/uL (ref 850–3900)
MCH: 30 pg (ref 27.0–33.0)
MCHC: 33.4 g/dL (ref 32.0–36.0)
MCV: 89.7 fL (ref 80.0–100.0)
MPV: 13.2 fL — ABNORMAL HIGH (ref 7.5–12.5)
Monocytes Relative: 9 %
Neutro Abs: 2768 cells/uL (ref 1500–7800)
Neutrophils Relative %: 40.7 %
Platelets: 258 10*3/uL (ref 140–400)
RBC: 3.77 10*6/uL — ABNORMAL LOW (ref 3.80–5.10)
RDW: 13.5 % (ref 11.0–15.0)
Total Lymphocyte: 47.4 %
WBC: 6.8 10*3/uL (ref 3.8–10.8)

## 2021-02-26 LAB — COMPLETE METABOLIC PANEL WITH GFR
AG Ratio: 1.5 (calc) (ref 1.0–2.5)
ALT: 34 U/L — ABNORMAL HIGH (ref 6–29)
AST: 29 U/L (ref 10–35)
Albumin: 3.9 g/dL (ref 3.6–5.1)
Alkaline phosphatase (APISO): 85 U/L (ref 37–153)
BUN: 17 mg/dL (ref 7–25)
CO2: 27 mmol/L (ref 20–32)
Calcium: 9.6 mg/dL (ref 8.6–10.4)
Chloride: 95 mmol/L — ABNORMAL LOW (ref 98–110)
Creat: 0.8 mg/dL (ref 0.60–1.00)
Globulin: 2.6 g/dL (calc) (ref 1.9–3.7)
Glucose, Bld: 98 mg/dL (ref 65–99)
Potassium: 3.8 mmol/L (ref 3.5–5.3)
Sodium: 132 mmol/L — ABNORMAL LOW (ref 135–146)
Total Bilirubin: 1.3 mg/dL — ABNORMAL HIGH (ref 0.2–1.2)
Total Protein: 6.5 g/dL (ref 6.1–8.1)
eGFR: 75 mL/min/{1.73_m2} (ref >=60)

## 2021-02-26 LAB — TSH: TSH: 1.17 mIU/L (ref 0.40–4.50)

## 2021-03-03 ENCOUNTER — Telehealth: Payer: Self-pay | Admitting: *Deleted

## 2021-03-03 NOTE — Telephone Encounter (Signed)
Received VM from patient.   Reports that she is going out of town to [place] to visit her sister. States that she has postponed her GI consult at this time. Reports that she is feeling much better, so she will follow up once she returns from traveling.   PCP to be made aware.

## 2021-03-07 ENCOUNTER — Other Ambulatory Visit: Payer: Self-pay | Admitting: Family Medicine

## 2021-03-16 ENCOUNTER — Other Ambulatory Visit (HOSPITAL_COMMUNITY): Payer: Self-pay | Admitting: Gastroenterology

## 2021-03-16 ENCOUNTER — Ambulatory Visit
Admission: RE | Admit: 2021-03-16 | Discharge: 2021-03-16 | Disposition: A | Discharge status: Home / Self Care | Payer: Medicare Other | Source: Ambulatory Visit | Attending: Gastroenterology | Admitting: Gastroenterology

## 2021-03-16 ENCOUNTER — Other Ambulatory Visit: Payer: Self-pay | Admitting: Gastroenterology

## 2021-03-16 ENCOUNTER — Ambulatory Visit: Payer: Medicare Other | Admitting: Family Medicine

## 2021-03-16 ENCOUNTER — Telehealth: Payer: Self-pay | Admitting: *Deleted

## 2021-03-16 DIAGNOSIS — K219 Gastro-esophageal reflux disease without esophagitis: Secondary | ICD-10-CM | POA: Diagnosis not present

## 2021-03-16 DIAGNOSIS — R1011 Right upper quadrant pain: Secondary | ICD-10-CM

## 2021-03-16 DIAGNOSIS — K5909 Other constipation: Secondary | ICD-10-CM

## 2021-03-16 DIAGNOSIS — K5904 Chronic idiopathic constipation: Secondary | ICD-10-CM | POA: Diagnosis not present

## 2021-03-16 DIAGNOSIS — R109 Unspecified abdominal pain: Secondary | ICD-10-CM | POA: Diagnosis not present

## 2021-03-16 DIAGNOSIS — R634 Abnormal weight loss: Secondary | ICD-10-CM | POA: Diagnosis not present

## 2021-03-16 DIAGNOSIS — K625 Hemorrhage of anus and rectum: Secondary | ICD-10-CM | POA: Diagnosis not present

## 2021-03-16 DIAGNOSIS — F341 Dysthymic disorder: Secondary | ICD-10-CM | POA: Diagnosis not present

## 2021-03-16 DIAGNOSIS — K5730 Diverticulosis of large intestine without perforation or abscess without bleeding: Secondary | ICD-10-CM | POA: Diagnosis not present

## 2021-03-16 NOTE — Telephone Encounter (Signed)
Call placed to patient to make aware.   Was advised from pt son that she is currently being seen by Dr. Collene Mares, GI at [facility] today.

## 2021-03-16 NOTE — Telephone Encounter (Signed)
Susy Frizzle, MD  Rock Sobol, Eden Lathe, LPN If Jackie Lynch is still losing weight (she cancelled her appointment at 915, she needs to see GI.  See last ov.

## 2021-03-23 ENCOUNTER — Other Ambulatory Visit: Payer: Self-pay | Admitting: Gastroenterology

## 2021-03-23 DIAGNOSIS — R634 Abnormal weight loss: Secondary | ICD-10-CM

## 2021-03-25 DIAGNOSIS — E0590 Thyrotoxicosis, unspecified without thyrotoxic crisis or storm: Secondary | ICD-10-CM | POA: Diagnosis not present

## 2021-04-01 DIAGNOSIS — I10 Essential (primary) hypertension: Secondary | ICD-10-CM | POA: Diagnosis not present

## 2021-04-01 DIAGNOSIS — Z23 Encounter for immunization: Secondary | ICD-10-CM | POA: Diagnosis not present

## 2021-04-01 DIAGNOSIS — E0590 Thyrotoxicosis, unspecified without thyrotoxic crisis or storm: Secondary | ICD-10-CM | POA: Diagnosis not present

## 2021-04-07 ENCOUNTER — Encounter: Payer: Self-pay | Admitting: Family Medicine

## 2021-04-07 DIAGNOSIS — Z1231 Encounter for screening mammogram for malignant neoplasm of breast: Secondary | ICD-10-CM | POA: Diagnosis not present

## 2021-04-09 ENCOUNTER — Encounter: Payer: Medicare Other | Admitting: Obstetrics and Gynecology

## 2021-04-12 ENCOUNTER — Other Ambulatory Visit: Payer: Self-pay

## 2021-04-12 ENCOUNTER — Ambulatory Visit
Admission: RE | Admit: 2021-04-12 | Discharge: 2021-04-12 | Disposition: A | Discharge status: Home / Self Care | Payer: Medicare Other | Source: Ambulatory Visit | Attending: Gastroenterology | Admitting: Gastroenterology

## 2021-04-12 ENCOUNTER — Ambulatory Visit (INDEPENDENT_AMBULATORY_CARE_PROVIDER_SITE_OTHER): Payer: Medicare Other | Admitting: Obstetrics and Gynecology

## 2021-04-12 VITALS — BP 165/97 | HR 104 | Wt 134.0 lb

## 2021-04-12 DIAGNOSIS — N3000 Acute cystitis without hematuria: Secondary | ICD-10-CM

## 2021-04-12 DIAGNOSIS — R634 Abnormal weight loss: Secondary | ICD-10-CM

## 2021-04-12 DIAGNOSIS — R350 Frequency of micturition: Secondary | ICD-10-CM | POA: Diagnosis not present

## 2021-04-12 DIAGNOSIS — K5900 Constipation, unspecified: Secondary | ICD-10-CM | POA: Diagnosis not present

## 2021-04-12 DIAGNOSIS — N281 Cyst of kidney, acquired: Secondary | ICD-10-CM | POA: Diagnosis not present

## 2021-04-12 DIAGNOSIS — I700 Atherosclerosis of aorta: Secondary | ICD-10-CM | POA: Diagnosis not present

## 2021-04-12 LAB — POCT URINALYSIS DIPSTICK
Appearance: NORMAL
Bilirubin, UA: NEGATIVE
Glucose, UA: NEGATIVE
Ketones, UA: NEGATIVE
Nitrite, UA: POSITIVE
Protein, UA: NEGATIVE
Spec Grav, UA: 1.01 (ref 1.010–1.025)
Urobilinogen, UA: 0.2 E.U./dL
pH, UA: 5.5 (ref 5.0–8.0)

## 2021-04-12 MED ORDER — SULFAMETHOXAZOLE-TRIMETHOPRIM 800-160 MG PO TABS: 1.0000 | ORAL_TABLET | Freq: Two times a day (BID) | ORAL | 0 refills | Status: AC

## 2021-04-12 MED ORDER — IOPAMIDOL (ISOVUE-300) INJECTION 61%
100.0000 mL | Freq: Once | INTRAVENOUS | Status: AC | PRN
  Administered 2021-04-12: 100 mL via INTRAVENOUS

## 2021-04-12 NOTE — Progress Notes (Signed)
Patient presented for Urodynamics today but UA showed positive nitrites. Will treat for UTI with bactrim DS BID x3 days then reschedule urodynamics.

## 2021-04-16 ENCOUNTER — Encounter: Payer: Self-pay | Admitting: Obstetrics and Gynecology

## 2021-04-16 ENCOUNTER — Ambulatory Visit (INDEPENDENT_AMBULATORY_CARE_PROVIDER_SITE_OTHER): Payer: Medicare Other | Admitting: Family Medicine

## 2021-04-16 ENCOUNTER — Other Ambulatory Visit: Payer: Self-pay

## 2021-04-16 VITALS — BP 150/76 | HR 72 | Temp 98.1°F | Resp 18 | Ht 60.0 in | Wt 119.0 lb

## 2021-04-16 DIAGNOSIS — K219 Gastro-esophageal reflux disease without esophagitis: Secondary | ICD-10-CM

## 2021-04-16 DIAGNOSIS — R110 Nausea: Secondary | ICD-10-CM | POA: Diagnosis not present

## 2021-04-16 DIAGNOSIS — R634 Abnormal weight loss: Secondary | ICD-10-CM | POA: Diagnosis not present

## 2021-04-16 DIAGNOSIS — F321 Major depressive disorder, single episode, moderate: Secondary | ICD-10-CM

## 2021-04-16 LAB — URINE CULTURE

## 2021-04-16 MED ORDER — MIRTAZAPINE 30 MG PO TBDP: 30.0000 mg | ORAL_TABLET | Freq: Every day | ORAL | 5 refills | Status: DC

## 2021-04-16 MED ORDER — NITROFURANTOIN MONOHYD MACRO 100 MG PO CAPS: 100.0000 mg | ORAL_CAPSULE | Freq: Two times a day (BID) | ORAL | 0 refills | Status: DC

## 2021-04-16 NOTE — Progress Notes (Signed)
Subjective:    Patient ID: Jackie Lynch, female    DOB: 07/08/1940, 80 y.o.   MRN: [id number]  HPI  11/16/20 Patient reports daily acid reflux.  She reports a burning sensation that comes up in her chest at times.  It radiates from below the xiphoid process up into her throat.  She also reports indigestion frequently when she eats.  She also reports frequent "salty wet burps".  It hurts worse when she lays down at night.  She has been taking Protonix twice a day per her report and states that this is not helping.  She denies any melena or hematochezia.  She denies any fevers or chills.  She denies any pleurisy or hemoptysis.  At that time, my plan was: Patient states that she has been taking Protonix every day.  I recommended that she switch to Nexium 40 mg twice a day because she states that that worked better for her in the past.  I also want her to use sucralfate 1 g p.o. q. ACH S..  Meanwhile check CBC, CMP, lipase.  Check H. pylori test.  Reassess in 2 weeks.  If not improving, will need EGD.  Also use Zofran 4 mg p.o. every 8 hours as needed nausea  12/01/20  Patient is a very sweet 80 year old Caucasian female here today for follow-up.  She has a past medical history of gastritis and GERD.  At her last visit I started her on twice daily Nexium and sucralfate 4 times daily and also gave her Zofran.  She states that the heartburn is some better.  However she continues to have a lot of nausea and she is asking for refill on the Zofran.  It seems like more of her symptoms at this point is constant nausea and upset stomach.  It seems like some of the burning and reflux have improved on the dual therapy.  She continues to deny any fever or melena or hematochezia.  On her lab work there was a mild elevation in her lipase which I do not believe is clinically significant.  My suspicions are possibly an ulcer versus gastritis versus IBS related to stress given the death of her husband versus less likely  malignancy in the stomach.  However at this point, her symptoms persist.  We discussed today whether or not she wanted to see her gastroenterologist for an EGD.  She states that this would make her feel more relieved if she can get "a clear bill of health".  At that time, my plan was:  Differential diagnosis includes GERD, gastritis, peptic ulcer disease, IBS related to stress given the recent passing of her husband, and less likely stomach malignancy.  However given the fact that her symptoms have persisted despite being on daily PPI therapy and now twice daily PPI therapy in addition to sucralfate, I will set up an appointment for her to see her gastroenterologist to discuss whether or not she needs an EGD to evaluate further to rule out underlying pathology in the stomach.  Continue Nexium and sucralfate for the time being.  I will refill her Zofran at her request.  I will also repeat her lipase.  If the lipase is persistently elevated we will need to image the pancreas.  02/25/21 CT scan in June was normal.  Wt Readings from Last 3 Encounters:  04/12/21 134 lb (60.8 kg)  02/25/21 134 lb (60.8 kg)  02/08/21 145 lb (65.8 kg)   Patient is lost considerable weight since I  last saw her.  She is lost 14 pounds.  She states that she is afraid to eat.  The reason she is afraid to eat is because she is "afraid to hurt".  She reports 2 episodes of severe burning pain in her epigastric area over the last few weeks.  She denies any melena.  She denies any hematochezia.  She states that she has been taking Nexium twice a day 40 mg as prescribed along with sucralfate 3 times a day.  Despite this she continues to have occasional epigastric pain and epigastric burning and a gnawing "hunger pain" in the subxiphoid area.  She has not yet seen the GI doctor.  She is not sure if she missed their phone call however no one has contacted her about an EGD.  She seems to be very anxious this morning.  She is very nervous.  She  denies any depression.  She denies any sadness.  But she does report to feeling anxious all the time.  She is taking Klonopin occasionally which I am concerned about given some mild memory impairment that I feel the patient has plus she lives alone and is unsteady on her feet.  At that time, my plan was:  Patient needs an EGD.  I am very concerned about her weight loss.  Repeat CMP today.  Patient's gallbladder has been removed per her report.  Therefore gallstones would be unlikely.  She is not jaundiced.  There is no evidence of any biliary tract disease on exam.  However I am concerned about the persistent burning sensation in the epigastric area despite the normal CT scan.  Therefore I am going to reach out to GI to contact her for an EGD as soon as possible.  Check CBC CMP and TSH because of weight loss.  If labs and EGD are normal, I would shift our focus to trying to better manage her anxiety as the anxiety itself could potentially be causing an upset stomach and weight loss due to poor appetite..  Meanwhile I will temporarily refill Klonopin  04/16/21 Saw GI who repeated CT scan of abd and pelvis which we ordered in June of this year: FINDINGS: Lower Chest: No acute findings.   Hepatobiliary: No hepatic masses identified. Prior cholecystectomy. No evidence of biliary obstruction.   Pancreas:  No mass or inflammatory changes.   Spleen: Within normal limits in size and appearance.   Adrenals/Urinary Tract: Several simple renal cysts are again seen bilaterally. No masses identified. No evidence of ureteral calculi or hydronephrosis.   Stomach/Bowel: No evidence of obstruction, inflammatory process or abnormal fluid collections. Normal appendix visualized.   Vascular/Lymphatic: No pathologically enlarged lymph nodes. No acute vascular findings. Aortic atherosclerotic calcification noted.   Reproductive: Prior hysterectomy noted. Adnexal regions are unremarkable in appearance.    Other: Significant artifact through the inferior pelvis noted from left hip prosthesis.   Musculoskeletal:  No suspicious bone lesions identified.   IMPRESSION: No evidence of neoplasm or other acute findings within the abdomen or pelvis.   Aortic Atherosclerosis (ICD10-I70.0).  Wt Readings from Last 3 Encounters:  04/16/21 119 lb (54 kg)  04/12/21 134 lb (60.8 kg)  02/25/21 134 lb (60.8 kg)   Patient states that she is scared to eat.  Initially when I first started seeing the patient she was having burning nagging epigastric pain.  Every time she eats she would have pain similar to an ulcer.  However the combination of Nexium and sucralfate has improved that.  She  is no longer having the burning epigastric pain.  However she is afraid that she will and therefore she is avoiding most foods.  She seems extremely anxious today.  She is also slightly confused.  She is unable to tell me what the gastroenterologist said at that visit.  I see the CT scan but I do not see any plans for an EGD.  Patient is obviously anxious and has used up almost all of her Klonopin.  She denies any trouble sleeping or suicidal ideation.  She does report depression and grief over the death of her husband.   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    GERD (gastroesophageal reflux disease)    H/O osteopenia    Hyperlipidemia    Hypertension    Hyperthyroidism    Nocturia    PONV (postoperative nausea and vomiting)    Renal cyst, congenital, right    Past Surgical History:  Procedure Laterality Date   ABDOMINAL HYSTERECTOMY     CHOLECYSTECTOMY     JOINT REPLACEMENT     KNEE ARTHROSCOPY     right and left   TOTAL HIP ARTHROPLASTY  11/14/2011   Procedure: TOTAL HIP ARTHROPLASTY;  Surgeon: Alta Corning, MD;  Location: [facility];  Service: Orthopedics;  Laterality: Left;   TOTAL KNEE  ARTHROPLASTY Right 08/31/2012   Procedure: TOTAL KNEE ARTHROPLASTY;  Surgeon: Alta Corning, MD;  Location: [facility];  Service: Orthopedics;  Laterality: Right;   TUBAL LIGATION     Current Outpatient Medications on File Prior to Visit  Medication Sig Dispense Refill   clonazePAM (KLONOPIN) 0.5 MG tablet Take 1 tablet (0.5 mg total) by mouth daily as needed for anxiety. 20 tablet 0   cloNIDine (CATAPRES) 0.1 MG tablet TAKE 1 TABLET BY MOUTH 3  TIMES DAILY FOR ELEVATED  BLOOD PRESSURE 270 tablet 3   fenofibrate 160 MG tablet TAKE 1 TABLET BY MOUTH  DAILY 90 tablet 3   hydrochlorothiazide (HYDRODIURIL) 25 MG tablet TAKE 1 TABLET BY MOUTH  DAILY 90 tablet 3   losartan (COZAAR) 100 MG tablet TAKE 1 TABLET BY MOUTH  DAILY 90 tablet 3   metoprolol tartrate (LOPRESSOR) 50 MG tablet TAKE 1 TABLET BY MOUTH  TWICE DAILY 180 tablet 3   ondansetron (ZOFRAN) 4 MG tablet Take 1 tablet (4 mg total) by mouth every 8 (eight) hours as needed for nausea or vomiting. 60 tablet 0   pantoprazole (PROTONIX) 40 MG tablet Take 1 tablet (40 mg total) by mouth daily. 30 tablet 1   pravastatin (PRAVACHOL) 80 MG tablet TAKE 1 TABLET BY MOUTH  DAILY 90 tablet 3   Probiotic Product (PROBIOTIC FORMULA PO) Take 1 tablet by mouth daily.     sucralfate (CARAFATE) 1 g tablet TAKE 1 TABLET( 1 GRAM) BY MOUTH IN THE MORNING, AT NOON, AND AT BEDTIME 270 tablet 1   No current facility-administered medications on file prior to visit.   Allergies  Allergen Reactions   Bee Venom Swelling   Social History   Socioeconomic History   Marital status: Widowed    Spouse name: Not on file   Number of children: Not on file   Years of education: Not on file   Highest education level: Not on file  Occupational History   Not on file  Tobacco Use   Smoking status: Never  Smokeless tobacco: Never  Vaping Use   Vaping Use: Never used  Substance and Sexual Activity   Alcohol use: No   Drug use: No   Sexual activity: Never  Other Topics  Concern   Not on file  Social History Narrative   Not on file   Social Determinants of Health   Financial Resource Strain: Not on file  Food Insecurity: Not on file  Transportation Needs: Not on file  Physical Activity: Not on file  Stress: Not on file  Social Connections: Not on file  Intimate Partner Violence: Not on file      Review of Systems  All other systems reviewed and are negative.     Objective:   Physical Exam Vitals reviewed. Exam conducted with a chaperone present.  Constitutional:      General: She is not in acute distress.    Appearance: Normal appearance. She is well-developed. She is not ill-appearing, toxic-appearing or diaphoretic.  Cardiovascular:     Rate and Rhythm: Normal rate and regular rhythm.     Pulses: Normal pulses.     Heart sounds: Normal heart sounds. No murmur heard.   No friction rub. No gallop.  Pulmonary:     Effort: Pulmonary effort is normal. No respiratory distress.     Breath sounds: No decreased breath sounds or wheezing.  Abdominal:     General: Abdomen is flat. Bowel sounds are normal. There is no distension.     Palpations: Abdomen is soft. There is no mass.     Tenderness: There is no abdominal tenderness. There is no guarding or rebound.  Musculoskeletal:     Cervical back: Neck supple.     Right lower leg: No edema.     Left lower leg: No edema.  Lymphadenopathy:     Cervical: No cervical adenopathy.  Neurological:     Mental Status: She is alert.          Assessment & Plan:  Weight loss  Gastroesophageal reflux disease, unspecified whether esophagitis present  Nausea  Current moderate episode of major depressive disorder without prior episode [facility]) Patient has had a thorough diagnostic work-up including 2 CAT scans in 4 months that are normal.  The only work-up thus far that has not occurred is an EGD.  She is seeing a gastroenterologist who has deferred that for now.  I truly believe that the majority  this is likely anxiety and depression and grief affecting her appetite.  I believe that her symptoms initially began with GERD or gastritis or even an ulcer but I believe that this has gradually improved on the PPI and sucralfate.  Now I feel that she is dealing most likely with anxiety and anorexia related to anxiety and depression.  Therefore I will start the patient on Remeron 30 mg p.o. nightly and recheck the patient in 1 month to see if her weight is improving.  I chose this 1 also because of its appetite stimulating properties.  Also encourage the patient to eat a high carbohydrate diet including bread, pastas, macaroni and cheese, mashed potatoes due to the caloric intake to try to increase her weight gain.  Recheck immediately if worsening

## 2021-04-16 NOTE — Addendum Note (Signed)
Addended by: Jaquita Folds on: 04/16/2021 06:04 PM   Modules accepted: Orders

## 2021-04-17 ENCOUNTER — Other Ambulatory Visit: Payer: Self-pay | Admitting: Family Medicine

## 2021-04-22 ENCOUNTER — Ambulatory Visit: Payer: Medicare Other | Admitting: Obstetrics and Gynecology

## 2021-04-23 ENCOUNTER — Other Ambulatory Visit: Payer: Self-pay | Admitting: Family Medicine

## 2021-04-23 NOTE — Telephone Encounter (Signed)
Ok to refill??  Last office visit 04/16/2021.  Last refill 02/25/2021.

## 2021-04-27 ENCOUNTER — Ambulatory Visit (INDEPENDENT_AMBULATORY_CARE_PROVIDER_SITE_OTHER): Payer: Medicare Other | Admitting: Obstetrics and Gynecology

## 2021-04-27 ENCOUNTER — Other Ambulatory Visit: Payer: Self-pay

## 2021-04-27 VITALS — BP 130/85 | HR 71

## 2021-04-27 DIAGNOSIS — Z87440 Personal history of urinary (tract) infections: Secondary | ICD-10-CM | POA: Diagnosis not present

## 2021-04-27 DIAGNOSIS — N3000 Acute cystitis without hematuria: Secondary | ICD-10-CM

## 2021-04-27 LAB — POCT URINALYSIS DIPSTICK
Appearance: NORMAL
Bilirubin, UA: NEGATIVE
Blood, UA: NEGATIVE
Glucose, UA: NEGATIVE
Ketones, UA: NEGATIVE
Leukocytes, UA: NEGATIVE
Nitrite, UA: POSITIVE
Odor: ABNORMAL
Protein, UA: NEGATIVE
Spec Grav, UA: 1.025 (ref 1.010–1.025)
Urobilinogen, UA: 0.2 E.U./dL
pH, UA: 6 (ref 5.0–8.0)

## 2021-04-27 MED ORDER — NITROFURANTOIN MONOHYD MACRO 100 MG PO CAPS: 100.0000 mg | ORAL_CAPSULE | Freq: Two times a day (BID) | ORAL | 0 refills | Status: AC

## 2021-04-27 NOTE — Progress Notes (Signed)
Pt presented for urodynamics and had positive nitrites in urine. Has been on antibiotics (macrobid) for 5 days ending on Sunday. Will send urine out for culture again and prescribe macrobid for 8 days. Will plan for urodynamics while she is still on antibiotics.   Time spent: I spent 15 minutes dedicated to the care of this patient on the date of this encounter to include pre-visit review of records, face-to-face time with the patient and post visit documentation and ordering medication/ testing.

## 2021-04-29 MED ORDER — NITROFURANTOIN MONOHYD MACRO 100 MG PO CAPS: 100.0000 mg | ORAL_CAPSULE | Freq: Two times a day (BID) | ORAL | 0 refills | Status: AC

## 2021-04-29 NOTE — Addendum Note (Signed)
Addended by: Jaquita Folds on: 04/29/2021 08:20 AM   Modules accepted: Orders

## 2021-05-02 LAB — URINE CULTURE

## 2021-05-04 ENCOUNTER — Other Ambulatory Visit: Payer: Self-pay

## 2021-05-04 ENCOUNTER — Ambulatory Visit (INDEPENDENT_AMBULATORY_CARE_PROVIDER_SITE_OTHER): Payer: Medicare Other | Admitting: Obstetrics and Gynecology

## 2021-05-04 VITALS — BP 132/85 | HR 63 | Wt 119.0 lb

## 2021-05-04 DIAGNOSIS — R350 Frequency of micturition: Secondary | ICD-10-CM

## 2021-05-04 DIAGNOSIS — Z87440 Personal history of urinary (tract) infections: Secondary | ICD-10-CM

## 2021-05-04 LAB — POCT URINALYSIS DIPSTICK
Appearance: NORMAL
Blood, UA: NEGATIVE
Glucose, UA: NEGATIVE
Ketones, UA: NEGATIVE
Leukocytes, UA: NEGATIVE
Nitrite, UA: NEGATIVE
Protein, UA: POSITIVE — AB
Spec Grav, UA: 1.025 (ref 1.010–1.025)
Urobilinogen, UA: 1 E.U./dL
pH, UA: 5.5 (ref 5.0–8.0)

## 2021-05-04 NOTE — Patient Instructions (Signed)

## 2021-05-04 NOTE — Progress Notes (Signed)
[facility] Urodynamics Procedure  Referring Physician: Susy Frizzle, MD Date of Procedure: 05/04/2021  Jackie Lynch is a 80 y.o. female who presents for urodynamic evaluation. Indication(s) for study: SUI  Vital Signs: BP 132/85   Pulse 63   Wt 119 lb (54 kg)   BMI 23.24 kg/m   Laboratory Results: A catheterized urine specimen revealed:  Positive protein, negative leukocytes and nitrites   Voiding Diary: Not performed  Procedure Timeout:  The correct patient was verified and the correct procedure was verified. The patient was in the correct position and safety precautions were reviewed based on at the patient's history.  Urodynamic Procedure A 391F dual lumen urodynamics catheter was placed under sterile conditions into the patient's bladder. A 391F catheter was placed into the rectum in order to measure abdominal pressure. EMG patches were placed in the appropriate position.  All connections were confirmed and calibrations/adjusted made. Saline was instilled into the bladder through the dual lumen catheters.  Cough/valsalva pressures were measured periodically during filling.  Patient was allowed to void.  The bladder was then emptied of its residual.  UROFLOW: Revealed a Qmax of 3 mL/sec.  She voided 12 mL and had a residual of 60 mL.  It was a normal pattern and represented normal habits though interpretation limited due to low voided volume.  CMG: This was performed with sterile water in the sitting position at a fill rate of 20- 30 mL/min.    First sensation of fullness was 59 mLs,  First urge was 78 mLs,  Strong urge was 198 mLs and  Capacity was 256 mLs  Stress incontinence was not demonstrated Highest negative Barrier CLPP was 58 cmH20 at 199 ml. Highest negative Barrier VLPP was 36 cmH20 at 256 ml in the standing position.  Detrusor function was normal, with no phasic contractions seen.  Compliance:  normal. End fill detrusor pressure was  -2.2cmH20.   UPP: MUCP with barrier reduction was 47.5 cm of water.    MICTURITION STUDY: Voiding was performed with reduction using scopettes in the sitting position.  Pdet at Qmax was 13 cm of water.  Qmax was 4.3 mL/sec.  It was a interrupted pattern.  She voided 169 mL and had a residual of 100 mL.  It was a volitional void, sustained detrusor contraction was present and abdominal straining was present  EMG: This was performed with patches.  She had voluntary contractions, recruitment with fill was present and urethral sphincter was relaxed with void.  The details of the procedure with the study tracings have been scanned into EPIC.   Urodynamic Impression:  1. Sensation was increased; capacity was normal 2. Stress Incontinence was not demonstrated; 3. Detrusor Overactivity was not demonstrated 4. Emptying was normal with a normal PVR, a sustained detrusor contraction present,  abdominal straining present, normal urethral sphincter activity on EMG.  Plan: - The patient will follow up  to discuss the findings and treatment options. '

## 2021-05-11 ENCOUNTER — Other Ambulatory Visit: Payer: Self-pay | Admitting: Family Medicine

## 2021-05-17 ENCOUNTER — Ambulatory Visit (INDEPENDENT_AMBULATORY_CARE_PROVIDER_SITE_OTHER): Payer: Medicare Other | Admitting: Family Medicine

## 2021-05-17 ENCOUNTER — Other Ambulatory Visit: Payer: Self-pay

## 2021-05-17 ENCOUNTER — Other Ambulatory Visit: Payer: Self-pay | Admitting: Family Medicine

## 2021-05-17 ENCOUNTER — Encounter: Payer: Self-pay | Admitting: Family Medicine

## 2021-05-17 VITALS — BP 122/72 | HR 67 | Temp 97.7°F | Resp 17 | Ht 60.0 in | Wt 111.0 lb

## 2021-05-17 DIAGNOSIS — F321 Major depressive disorder, single episode, moderate: Secondary | ICD-10-CM

## 2021-05-17 DIAGNOSIS — R634 Abnormal weight loss: Secondary | ICD-10-CM | POA: Diagnosis not present

## 2021-05-17 DIAGNOSIS — R1013 Epigastric pain: Secondary | ICD-10-CM

## 2021-05-17 DIAGNOSIS — R110 Nausea: Secondary | ICD-10-CM | POA: Diagnosis not present

## 2021-05-17 MED ORDER — ESCITALOPRAM OXALATE 10 MG PO TABS: 10.0000 mg | ORAL_TABLET | Freq: Every day | ORAL | 3 refills | Status: DC

## 2021-05-17 MED ORDER — MEGESTROL ACETATE 400 MG/10ML PO SUSP: 400.0000 mg | Freq: Every day | ORAL | 1 refills | Status: DC

## 2021-05-17 NOTE — Progress Notes (Signed)
Subjective:    Patient ID: Jackie Lynch, female    DOB: [birth year], 80 y.o.   MRN: [id number]  HPI  11/16/20 Patient reports daily acid reflux.  She reports a burning sensation that comes up in her chest at times.  It radiates from below the xiphoid process up into her throat.  She also reports indigestion frequently when she eats.  She also reports frequent "salty wet burps".  It hurts worse when she lays down at night.  She has been taking Protonix twice a day per her report and states that this is not helping.  She denies any melena or hematochezia.  She denies any fevers or chills.  She denies any pleurisy or hemoptysis.  At that time, my plan was: Patient states that she has been taking Protonix every day.  I recommended that she switch to Nexium 40 mg twice a day because she states that that worked better for her in the past.  I also want her to use sucralfate 1 g p.o. q. ACH S..  Meanwhile check CBC, CMP, lipase.  Check H. pylori test.  Reassess in 2 weeks.  If not improving, will need EGD.  Also use Zofran 4 mg p.o. every 8 hours as needed nausea  12/01/20  Patient is a very sweet 80 year old Caucasian female here today for follow-up.  She has a past medical history of gastritis and GERD.  At her last visit I started her on twice daily Nexium and sucralfate 4 times daily and also gave her Zofran.  She states that the heartburn is some better.  However she continues to have a lot of nausea and she is asking for refill on the Zofran.  It seems like more of her symptoms at this point is constant nausea and upset stomach.  It seems like some of the burning and reflux have improved on the dual therapy.  She continues to deny any fever or melena or hematochezia.  On her lab work there was a mild elevation in her lipase which I do not believe is clinically significant.  My suspicions are possibly an ulcer versus gastritis versus IBS related to stress given the death of her husband versus less likely  malignancy in the stomach.  However at this point, her symptoms persist.  We discussed today whether or not she wanted to see her gastroenterologist for an EGD.  She states that this would make her feel more relieved if she can get "a clear bill of health".  At that time, my plan was:  Differential diagnosis includes GERD, gastritis, peptic ulcer disease, IBS related to stress given the recent passing of her husband, and less likely stomach malignancy.  However given the fact that her symptoms have persisted despite being on daily PPI therapy and now twice daily PPI therapy in addition to sucralfate, I will set up an appointment for her to see her gastroenterologist to discuss whether or not she needs an EGD to evaluate further to rule out underlying pathology in the stomach.  Continue Nexium and sucralfate for the time being.  I will refill her Zofran at her request.  I will also repeat her lipase.  If the lipase is persistently elevated we will need to image the pancreas.  02/25/21 CT scan in June was normal.  Wt Readings from Last 3 Encounters:  05/04/21 119 lb (54 kg)  04/16/21 119 lb (54 kg)  04/12/21 134 lb (60.8 kg)   Patient is lost considerable weight since I  last saw her.  She is lost 14 pounds.  She states that she is afraid to eat.  The reason she is afraid to eat is because she is "afraid to hurt".  She reports 2 episodes of severe burning pain in her epigastric area over the last few weeks.  She denies any melena.  She denies any hematochezia.  She states that she has been taking Nexium twice a day 40 mg as prescribed along with sucralfate 3 times a day.  Despite this she continues to have occasional epigastric pain and epigastric burning and a gnawing "hunger pain" in the subxiphoid area.  She has not yet seen the GI doctor.  She is not sure if she missed their phone call however no one has contacted her about an EGD.  She seems to be very anxious this morning.  She is very nervous.  She  denies any depression.  She denies any sadness.  But she does report to feeling anxious all the time.  She is taking Klonopin occasionally which I am concerned about given some mild memory impairment that I feel the patient has plus she lives alone and is unsteady on her feet.  At that time, my plan was:  Patient needs an EGD.  I am very concerned about her weight loss.  Repeat CMP today.  Patient's gallbladder has been removed per her report.  Therefore gallstones would be unlikely.  She is not jaundiced.  There is no evidence of any biliary tract disease on exam.  However I am concerned about the persistent burning sensation in the epigastric area despite the normal CT scan.  Therefore I am going to reach out to GI to contact her for an EGD as soon as possible.  Check CBC CMP and TSH because of weight loss.  If labs and EGD are normal, I would shift our focus to trying to better manage her anxiety as the anxiety itself could potentially be causing an upset stomach and weight loss due to poor appetite..  Meanwhile I will temporarily refill Klonopin  04/16/21 Saw GI who repeated CT scan of abd and pelvis which we ordered in June of this year: FINDINGS: Lower Chest: No acute findings.   Hepatobiliary: No hepatic masses identified. Prior cholecystectomy. No evidence of biliary obstruction.   Pancreas:  No mass or inflammatory changes.   Spleen: Within normal limits in size and appearance.   Adrenals/Urinary Tract: Several simple renal cysts are again seen bilaterally. No masses identified. No evidence of ureteral calculi or hydronephrosis.   Stomach/Bowel: No evidence of obstruction, inflammatory process or abnormal fluid collections. Normal appendix visualized.   Vascular/Lymphatic: No pathologically enlarged lymph nodes. No acute vascular findings. Aortic atherosclerotic calcification noted.   Reproductive: Prior hysterectomy noted. Adnexal regions are unremarkable in appearance.    Other: Significant artifact through the inferior pelvis noted from left hip prosthesis.   Musculoskeletal:  No suspicious bone lesions identified.   IMPRESSION: No evidence of neoplasm or other acute findings within the abdomen or pelvis.   Aortic Atherosclerosis (ICD10-I70.0).  Wt Readings from Last 3 Encounters:  05/04/21 119 lb (54 kg)  04/16/21 119 lb (54 kg)  04/12/21 134 lb (60.8 kg)   Patient states that she is scared to eat.  Initially when I first started seeing the patient she was having burning nagging epigastric pain.  Every time she eats she would have pain similar to an ulcer.  However the combination of Nexium and sucralfate has improved that.  She  is no longer having the burning epigastric pain.  However she is afraid that she will and therefore she is avoiding most foods.  She seems extremely anxious today.  She is also slightly confused.  She is unable to tell me what the gastroenterologist said at that visit.  I see the CT scan but I do not see any plans for an EGD.  Patient is obviously anxious and has used up almost all of her Klonopin.  She denies any trouble sleeping or suicidal ideation.  She does report depression and grief over the death of her husband.  At that time, my plan was:  Patient has had a thorough diagnostic work-up including 2 CAT scans in 4 months that are normal.  The only work-up thus far that has not occurred is an EGD.  She is seeing a gastroenterologist who has deferred that for now.  I truly believe that the majority this is likely anxiety and depression and grief affecting her appetite.  I believe that her symptoms initially began with GERD or gastritis or even an ulcer but I believe that this has gradually improved on the PPI and sucralfate.  Now I feel that she is dealing most likely with anxiety and anorexia related to anxiety and depression.  Therefore I will start the patient on Remeron 30 mg p.o. nightly and recheck the patient in 1 month to see  if her weight is improving.  I chose this 1 also because of its appetite stimulating properties.  Also encourage the patient to eat a high carbohydrate diet including bread, pastas, macaroni and cheese, mashed potatoes due to the caloric intake to try to increase her weight gain.  Recheck immediately if worsening  05/17/21    Wt Readings from Last 3 Encounters:  05/17/21 111 lb (50.3 kg)  05/04/21 119 lb (54 kg)  04/16/21 119 lb (54 kg)   After her last visit, the patient has lost 8 pounds.  She is down from 1 19-1 11.  She continues to have nausea whenever she eats.  She also reports a burning discomfort in her epigastric area whenever she eats.  As result she is afraid to try to eat any substantial food.  She is primarily just grazing and eating soups and crackers.  She is not eating substantial meals.  She denies any fevers or chills.  She took the Remeron but stopped it after less than a week due to nightmares.  She continues to report feeling anxious and jittery.  I am also concerned that she has mild age-related cognitive decline that could be affecting her as well. 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    GERD (gastroesophageal reflux disease)    H/O osteopenia    Hyperlipidemia    Hypertension    Hyperthyroidism    Nocturia    PONV (postoperative nausea and vomiting)    Renal cyst, congenital, right    Past Surgical History:  Procedure Laterality Date   ABDOMINAL HYSTERECTOMY     CHOLECYSTECTOMY     JOINT REPLACEMENT     KNEE ARTHROSCOPY     right and left   TOTAL HIP ARTHROPLASTY  11/14/2011   Procedure: TOTAL HIP ARTHROPLASTY;  Surgeon: Alta Corning, MD;  Location: [place];  Service: Orthopedics;  Laterality: Left;   TOTAL KNEE ARTHROPLASTY Right 08/31/2012   Procedure: TOTAL  KNEE ARTHROPLASTY;  Surgeon: Alta Corning, MD;  Location: [facility];  Service:  Orthopedics;  Laterality: Right;   TUBAL LIGATION     Current Outpatient Medications on File Prior to Visit  Medication Sig Dispense Refill   clonazePAM (KLONOPIN) 0.5 MG tablet TAKE 1 TABLET(0.5 MG) BY MOUTH DAILY AS NEEDED FOR ANXIETY 20 tablet 0   cloNIDine (CATAPRES) 0.1 MG tablet TAKE 1 TABLET BY MOUTH 3  TIMES DAILY FOR ELEVATED  BLOOD PRESSURE 270 tablet 3   esomeprazole (NEXIUM) 40 MG capsule TAKE 1 CAPSULE(40 MG) BY MOUTH IN THE MORNING AND AT BEDTIME 60 capsule 2   fenofibrate 160 MG tablet TAKE 1 TABLET BY MOUTH  DAILY 90 tablet 3   hydrochlorothiazide (HYDRODIURIL) 25 MG tablet TAKE 1 TABLET BY MOUTH  DAILY 90 tablet 3   losartan (COZAAR) 100 MG tablet TAKE 1 TABLET BY MOUTH  DAILY 90 tablet 3   metoprolol tartrate (LOPRESSOR) 50 MG tablet TAKE 1 TABLET BY MOUTH  TWICE DAILY 180 tablet 3   mirtazapine (REMERON SOL-TAB) 30 MG disintegrating tablet Take 1 tablet (30 mg total) by mouth at bedtime. 30 tablet 5   ondansetron (ZOFRAN) 4 MG tablet Take 1 tablet (4 mg total) by mouth every 8 (eight) hours as needed for nausea or vomiting. 60 tablet 0   pravastatin (PRAVACHOL) 80 MG tablet TAKE 1 TABLET BY MOUTH  DAILY 90 tablet 3   Probiotic Product (PROBIOTIC FORMULA PO) Take 1 tablet by mouth daily.     sucralfate (CARAFATE) 1 g tablet TAKE 1 TABLET BY MOUTH IN THE MORNING, 1 TABLET AT NOON, AND 1 TABLET AT BEDTIME 270 tablet 1   No current facility-administered medications on file prior to visit.   Allergies  Allergen Reactions   Bee Venom Swelling   Social History   Socioeconomic History   Marital status: Widowed    Spouse name: Not on file   Number of children: Not on file   Years of education: Not on file   Highest education level: Not on file  Occupational History   Not on file  Tobacco Use   Smoking status: Never   Smokeless tobacco: Never  Vaping Use   Vaping Use: Never used  Substance and Sexual Activity   Alcohol use: No   Drug use: No   Sexual activity: Never   Other Topics Concern   Not on file  Social History Narrative   Not on file   Social Determinants of Health   Financial Resource Strain: Not on file  Food Insecurity: Not on file  Transportation Needs: Not on file  Physical Activity: Not on file  Stress: Not on file  Social Connections: Not on file  Intimate Partner Violence: Not on file      Review of Systems  All other systems reviewed and are negative.     Objective:   Physical Exam Vitals reviewed. Exam conducted with a chaperone present.  Constitutional:      General: She is not in acute distress.    Appearance: Normal appearance. She is well-developed. She is not ill-appearing, toxic-appearing or diaphoretic.  Cardiovascular:     Rate and Rhythm: Normal rate and regular rhythm.     Pulses: Normal pulses.     Heart sounds: Normal heart sounds. No murmur heard.   No friction rub. No gallop.  Pulmonary:     Effort: Pulmonary effort is normal. No respiratory distress.     Breath sounds: No decreased breath sounds or wheezing.  Abdominal:     General: Abdomen is flat. Bowel sounds are normal. There is no distension.     Palpations: Abdomen is soft. There is no mass.     Tenderness: There is no abdominal tenderness. There is no guarding or rebound.  Musculoskeletal:     Cervical back: Neck supple.     Right lower leg: No edema.     Left lower leg: No edema.  Lymphadenopathy:     Cervical: No cervical adenopathy.  Neurological:     Mental Status: She is alert.          Assessment & Plan:  Weight loss  Epigastric pain  Nausea  Current moderate episode of major depressive disorder without prior episode (Edith Endave) I fear her weight loss is multifactorial but primarily due to poor appetite and decreased oral intake.  However she has had 2 normal CAT scans and continues to have nausea and epigastric pain with food.  Therefore I do feel that she would require an EGD to evaluate this further.  I want to rule out  gastric malignancy so I will refer her back to her GI specialist.  I will also discontinue Remeron due to the nightmares and replace it with Megace 400 mg daily as an appetite stimulant.  I want to recheck the patient's weight in 2 weeks.  I will also start Lexapro 10 mg a day as I do feel some of this is related to anxiety and depression and grief and loneliness.

## 2021-05-21 ENCOUNTER — Ambulatory Visit (INDEPENDENT_AMBULATORY_CARE_PROVIDER_SITE_OTHER): Payer: Medicare Other | Admitting: Obstetrics and Gynecology

## 2021-05-21 ENCOUNTER — Other Ambulatory Visit: Payer: Self-pay

## 2021-05-21 ENCOUNTER — Encounter: Payer: Self-pay | Admitting: Obstetrics and Gynecology

## 2021-05-21 VITALS — BP 124/74 | HR 72 | Wt 109.0 lb

## 2021-05-21 DIAGNOSIS — N8110 Cystocele, unspecified: Secondary | ICD-10-CM | POA: Diagnosis not present

## 2021-05-21 DIAGNOSIS — N993 Prolapse of vaginal vault after hysterectomy: Secondary | ICD-10-CM | POA: Diagnosis not present

## 2021-05-21 NOTE — Progress Notes (Signed)
[facility] Return Visit  SUBJECTIVE  History of Present Illness: Jackie Lynch is a 80 y.o. female seen in follow-up to discuss surgery after urodynamic testing. Also starting to have some burning with urination again. She was unable to leave a urine sample today.   Urodynamic Impression:  1. Sensation was increased; capacity was normal 2. Stress Incontinence was not demonstrated; 3. Detrusor Overactivity was not demonstrated 4. Emptying was normal with a normal PVR, a sustained detrusor contraction present,  abdominal straining present, normal urethral sphincter activity on EMG.    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   Past Surgical History: She  has a past surgical history that includes Cholecystectomy; Tubal ligation; Knee arthroscopy; Abdominal hysterectomy; Total hip arthroplasty (11/14/2011); Joint replacement; and Total knee arthroplasty (Right, 08/31/2012).   Medications: She has a current medication list which includes the following prescription(s): clonazepam, clonidine, escitalopram, esomeprazole, fenofibrate, hydrochlorothiazide, losartan, megestrol, metoprolol tartrate, ondansetron, pravastatin, bacillus coagulans-inulin, sucralfate, and mirtazapine.   Allergies: Patient is allergic to bee venom.   Social History: Patient  reports that she has never smoked. She has never used smokeless tobacco. She reports that she does not drink alcohol and does not use drugs.      OBJECTIVE     Physical Exam: Vitals:   05/21/21 1037  BP: 124/74  Pulse: 72  Weight: 109 lb (49.4 kg)   Gen: No apparent distress, A&O x 3.  Detailed Urogynecologic  Evaluation:  Deferred. Prior exam showed:  POP-Q (02/08/21):    POP-Q   -0.5                                            Aa   -0.5                                           Ba   -5                                              C    3                                            Gh   2                                            Pb   7                                            tvl    -3  Ap   -3                                            Bp                                                  D        ASSESSMENT AND PLAN    Jackie Lynch is a 80 y.o. with:  1. Prolapse of anterior vaginal wall   2. Vaginal vault prolapse after hysterectomy    - She was advised to wait for surgery until after her weight loss has been worked up.   Plan for surgery: Exam under anesthesia, anterior repair, sacrospinous ligament fixation, cystoscopy  - We reviewed the patient's specific anatomic and functional findings, with the assistance of diagrams, and together finalized the above procedure. The planned surgical procedures were discussed along with the surgical risks outlined below, which were also provided on a detailed handout. Additional treatment options including expectant management, conservative management, medical management were discussed where appropriate.  We reviewed the benefits and risks of each treatment option.   General Surgical Risks: For all procedures, there are risks of bleeding, infection, damage to surrounding organs including but not limited to bowel, bladder, blood vessels, ureters and nerves, and need for further surgery if an injury were to occur. These risks are all low with minimally invasive surgery.   There are risks of numbness and weakness at any body site or buttock/rectal pain.  It is possible that baseline pain can be worsened by surgery, either with or without mesh. If surgery is vaginal, there is also a low risk of possible  conversion to laparoscopy or open abdominal incision where indicated. Very rare risks include blood transfusion, blood clot, heart attack, pneumonia, or death.   There is also a risk of short-term postoperative urinary retention with need to use a catheter. About half of patients need to go home from surgery with a catheter, which is then later removed in the office. The risk of long-term need for a catheter is very low. There is also a risk of worsening of overactive bladder.   Prolapse (with or without mesh): Risk factors for surgical failure  include things that put pressure on your pelvis and the surgical repair, including obesity, chronic cough, and heavy lifting or straining (including lifting children or adults, straining on the toilet, or lifting heavy objects such as furniture or anything weighing >25 lbs. Risks of recurrence is 20-30% with vaginal native tissue repair and a less than 10% with sacrocolpopexy with mesh.     - For preop Visit:  She is required to have a visit within 30 days of her surgery.    - Medical clearance: Letter sent to PCP Dr Dennard Schaumann to notify when patient is medically optimized for surgery - Anticoagulant use: No - Medicaid Hysterectomy form: No - Accepts blood transfusion: Yes - Expected length of stay: outpatient  Will send request for surgery once patient has been cleared. Pt/ her PCP will notify.   Jaquita Folds, MD   Time spent: I spent 20 minutes dedicated to the care of this patient on the date of this encounter to include pre-visit review of records, face-to-face  time with the patient discussing surgery and post visit documentation and ordering medication/ testing.

## 2021-05-31 ENCOUNTER — Ambulatory Visit (INDEPENDENT_AMBULATORY_CARE_PROVIDER_SITE_OTHER): Payer: Medicare Other | Admitting: Family Medicine

## 2021-05-31 ENCOUNTER — Encounter: Payer: Self-pay | Admitting: Family Medicine

## 2021-05-31 ENCOUNTER — Other Ambulatory Visit: Payer: Self-pay

## 2021-05-31 VITALS — BP 142/82 | HR 63 | Temp 98.3°F | Resp 18 | Ht 60.0 in | Wt 106.0 lb

## 2021-05-31 DIAGNOSIS — R300 Dysuria: Secondary | ICD-10-CM

## 2021-05-31 DIAGNOSIS — R110 Nausea: Secondary | ICD-10-CM

## 2021-05-31 DIAGNOSIS — R634 Abnormal weight loss: Secondary | ICD-10-CM

## 2021-05-31 DIAGNOSIS — R1013 Epigastric pain: Secondary | ICD-10-CM | POA: Diagnosis not present

## 2021-05-31 LAB — COMPLETE METABOLIC PANEL WITH GFR
AG Ratio: 1.6 (calc) (ref 1.0–2.5)
ALT: 22 U/L (ref 6–29)
AST: 48 U/L — ABNORMAL HIGH (ref 10–35)
Albumin: 4 g/dL (ref 3.6–5.1)
Alkaline phosphatase (APISO): 22 U/L — ABNORMAL LOW (ref 37–153)
BUN/Creatinine Ratio: 26 (calc) — ABNORMAL HIGH (ref 6–22)
BUN: 28 mg/dL — ABNORMAL HIGH (ref 7–25)
CO2: 23 mmol/L (ref 20–32)
Calcium: 10.1 mg/dL (ref 8.6–10.4)
Chloride: 89 mmol/L — ABNORMAL LOW (ref 98–110)
Creat: 1.07 mg/dL — ABNORMAL HIGH (ref 0.60–1.00)
Globulin: 2.5 g/dL (calc) (ref 1.9–3.7)
Glucose, Bld: 84 mg/dL (ref 65–99)
Potassium: 3.3 mmol/L — ABNORMAL LOW (ref 3.5–5.3)
Sodium: 124 mmol/L — ABNORMAL LOW (ref 135–146)
Total Bilirubin: 1.5 mg/dL — ABNORMAL HIGH (ref 0.2–1.2)
Total Protein: 6.5 g/dL (ref 6.1–8.1)
eGFR: 53 mL/min/{1.73_m2} — ABNORMAL LOW (ref >=60)

## 2021-05-31 LAB — URINALYSIS, ROUTINE W REFLEX MICROSCOPIC
Bilirubin Urine: NEGATIVE
Glucose, UA: NEGATIVE
Hgb urine dipstick: NEGATIVE
Ketones, ur: NEGATIVE
Nitrite: POSITIVE — AB
Protein, ur: NEGATIVE
Specific Gravity, Urine: 1.015 (ref 1.001–1.035)
pH: 5.5 (ref 5.0–8.0)

## 2021-05-31 LAB — CBC WITH DIFFERENTIAL/PLATELET
Absolute Monocytes: 554 cells/uL (ref 200–950)
Basophils Absolute: 62 cells/uL (ref 0–200)
Basophils Relative: 0.8 %
Eosinophils Absolute: 218 cells/uL (ref 15–500)
Eosinophils Relative: 2.8 %
HCT: 34.7 % — ABNORMAL LOW (ref 35.0–45.0)
Hemoglobin: 11.7 g/dL (ref 11.7–15.5)
Lymphs Abs: 4797 cells/uL — ABNORMAL HIGH (ref 850–3900)
MCH: 29.7 pg (ref 27.0–33.0)
MCHC: 33.7 g/dL (ref 32.0–36.0)
MCV: 88.1 fL (ref 80.0–100.0)
MPV: 13.4 fL — ABNORMAL HIGH (ref 7.5–12.5)
Monocytes Relative: 7.1 %
Neutro Abs: 2168 cells/uL (ref 1500–7800)
Neutrophils Relative %: 27.8 %
Platelets: 213 10*3/uL (ref 140–400)
RBC: 3.94 10*6/uL (ref 3.80–5.10)
RDW: 13.4 % (ref 11.0–15.0)
Total Lymphocyte: 61.5 %
WBC: 7.8 10*3/uL (ref 3.8–10.8)

## 2021-05-31 LAB — MICROSCOPIC MESSAGE

## 2021-05-31 LAB — TSH: TSH: 3.45 mIU/L (ref 0.40–4.50)

## 2021-05-31 MED ORDER — CEPHALEXIN 500 MG PO CAPS: 500.0000 mg | ORAL_CAPSULE | Freq: Three times a day (TID) | ORAL | 0 refills | Status: DC

## 2021-05-31 NOTE — Progress Notes (Signed)
Subjective:    Patient ID: Jackie Lynch, female    DOB: 04-07-1941, 80 y.o.   MRN: [id number]  HPI  11/16/20 Patient reports daily acid reflux.  She reports a burning sensation that comes up in her chest at times.  It radiates from below the xiphoid process up into her throat.  She also reports indigestion frequently when she eats.  She also reports frequent "salty wet burps".  It hurts worse when she lays down at night.  She has been taking Protonix twice a day per her report and states that this is not helping.  She denies any melena or hematochezia.  She denies any fevers or chills.  She denies any pleurisy or hemoptysis.  At that time, my plan was: Patient states that she has been taking Protonix every day.  I recommended that she switch to Nexium 40 mg twice a day because she states that that worked better for her in the past.  I also want her to use sucralfate 1 g p.o. q. ACH S..  Meanwhile check CBC, CMP, lipase.  Check H. pylori test.  Reassess in 2 weeks.  If not improving, will need EGD.  Also use Zofran 4 mg p.o. every 8 hours as needed nausea  12/01/20  Patient is a very sweet 80 year old Caucasian female here today for follow-up.  She has a past medical history of gastritis and GERD.  At her last visit I started her on twice daily Nexium and sucralfate 4 times daily and also gave her Zofran.  She states that the heartburn is some better.  However she continues to have a lot of nausea and she is asking for refill on the Zofran.  It seems like more of her symptoms at this point is constant nausea and upset stomach.  It seems like some of the burning and reflux have improved on the dual therapy.  She continues to deny any fever or melena or hematochezia.  On her lab work there was a mild elevation in her lipase which I do not believe is clinically significant.  My suspicions are possibly an ulcer versus gastritis versus IBS related to stress given the death of her husband versus less likely  malignancy in the stomach.  However at this point, her symptoms persist.  We discussed today whether or not she wanted to see her gastroenterologist for an EGD.  She states that this would make her feel more relieved if she can get "a clear bill of health".  At that time, my plan was:  Differential diagnosis includes GERD, gastritis, peptic ulcer disease, IBS related to stress given the recent passing of her husband, and less likely stomach malignancy.  However given the fact that her symptoms have persisted despite being on daily PPI therapy and now twice daily PPI therapy in addition to sucralfate, I will set up an appointment for her to see her gastroenterologist to discuss whether or not she needs an EGD to evaluate further to rule out underlying pathology in the stomach.  Continue Nexium and sucralfate for the time being.  I will refill her Zofran at her request.  I will also repeat her lipase.  If the lipase is persistently elevated we will need to image the pancreas.  02/25/21 CT scan in June was normal.  Wt Readings from Last 3 Encounters:  05/21/21 109 lb (49.4 kg)  05/17/21 111 lb (50.3 kg)  05/04/21 119 lb (54 kg)   Patient is lost considerable weight since I  last saw her.  She is lost 14 pounds.  She states that she is afraid to eat.  The reason she is afraid to eat is because she is "afraid to hurt".  She reports 2 episodes of severe burning pain in her epigastric area over the last few weeks.  She denies any melena.  She denies any hematochezia.  She states that she has been taking Nexium twice a day 40 mg as prescribed along with sucralfate 3 times a day.  Despite this she continues to have occasional epigastric pain and epigastric burning and a gnawing "hunger pain" in the subxiphoid area.  She has not yet seen the GI doctor.  She is not sure if she missed their phone call however no one has contacted her about an EGD.  She seems to be very anxious this morning.  She is very nervous.  She  denies any depression.  She denies any sadness.  But she does report to feeling anxious all the time.  She is taking Klonopin occasionally which I am concerned about given some mild memory impairment that I feel the patient has plus she lives alone and is unsteady on her feet.  At that time, my plan was:  Patient needs an EGD.  I am very concerned about her weight loss.  Repeat CMP today.  Patient's gallbladder has been removed per her report.  Therefore gallstones would be unlikely.  She is not jaundiced.  There is no evidence of any biliary tract disease on exam.  However I am concerned about the persistent burning sensation in the epigastric area despite the normal CT scan.  Therefore I am going to reach out to GI to contact her for an EGD as soon as possible.  Check CBC CMP and TSH because of weight loss.  If labs and EGD are normal, I would shift our focus to trying to better manage her anxiety as the anxiety itself could potentially be causing an upset stomach and weight loss due to poor appetite..  Meanwhile I will temporarily refill Klonopin  04/16/21 Saw GI who repeated CT scan of abd and pelvis which we ordered in June of this year: FINDINGS: Lower Chest: No acute findings.   Hepatobiliary: No hepatic masses identified. Prior cholecystectomy. No evidence of biliary obstruction.   Pancreas:  No mass or inflammatory changes.   Spleen: Within normal limits in size and appearance.   Adrenals/Urinary Tract: Several simple renal cysts are again seen bilaterally. No masses identified. No evidence of ureteral calculi or hydronephrosis.   Stomach/Bowel: No evidence of obstruction, inflammatory process or abnormal fluid collections. Normal appendix visualized.   Vascular/Lymphatic: No pathologically enlarged lymph nodes. No acute vascular findings. Aortic atherosclerotic calcification noted.   Reproductive: Prior hysterectomy noted. Adnexal regions are unremarkable in appearance.    Other: Significant artifact through the inferior pelvis noted from left hip prosthesis.   Musculoskeletal:  No suspicious bone lesions identified.   IMPRESSION: No evidence of neoplasm or other acute findings within the abdomen or pelvis.   Aortic Atherosclerosis (ICD10-I70.0).  Wt Readings from Last 3 Encounters:  05/21/21 109 lb (49.4 kg)  05/17/21 111 lb (50.3 kg)  05/04/21 119 lb (54 kg)   Patient states that she is scared to eat.  Initially when I first started seeing the patient she was having burning nagging epigastric pain.  Every time she eats she would have pain similar to an ulcer.  However the combination of Nexium and sucralfate has improved that.  She  is no longer having the burning epigastric pain.  However she is afraid that she will and therefore she is avoiding most foods.  She seems extremely anxious today.  She is also slightly confused.  She is unable to tell me what the gastroenterologist said at that visit.  I see the CT scan but I do not see any plans for an EGD.  Patient is obviously anxious and has used up almost all of her Klonopin.  She denies any trouble sleeping or suicidal ideation.  She does report depression and grief over the death of her husband.  At that time, my plan was:  Patient has had a thorough diagnostic work-up including 2 CAT scans in 4 months that are normal.  The only work-up thus far that has not occurred is an EGD.  She is seeing a gastroenterologist who has deferred that for now.  I truly believe that the majority this is likely anxiety and depression and grief affecting her appetite.  I believe that her symptoms initially began with GERD or gastritis or even an ulcer but I believe that this has gradually improved on the PPI and sucralfate.  Now I feel that she is dealing most likely with anxiety and anorexia related to anxiety and depression.  Therefore I will start the patient on Remeron 30 mg p.o. nightly and recheck the patient in 1 month to  see if her weight is improving.  I chose this 1 also because of its appetite stimulating properties.  Also encourage the patient to eat a high carbohydrate diet including bread, pastas, macaroni and cheese, mashed potatoes due to the caloric intake to try to increase her weight gain.  Recheck immediately if worsening  05/17/21 Wt Readings from Last 3 Encounters:  05/21/21 109 lb (49.4 kg)  05/17/21 111 lb (50.3 kg)  05/04/21 119 lb (54 kg)   After her last visit, the patient has lost 8 pounds.  She is down from 1 19-1 11.  She continues to have nausea whenever she eats.  She also reports a burning discomfort in her epigastric area whenever she eats.  As result she is afraid to try to eat any substantial food.  She is primarily just grazing and eating soups and crackers.  She is not eating substantial meals.  She denies any fevers or chills.  She took the Remeron but stopped it after less than a week due to nightmares.  She continues to report feeling anxious and jittery.  I am also concerned that she has mild age-related cognitive decline that could be affecting her as well.  At that time, my plan was:  I fear her weight loss is multifactorial but primarily due to poor appetite and decreased oral intake.  However she has had 2 normal CAT scans and continues to have nausea and epigastric pain with food.  Therefore I do feel that she would require an EGD to evaluate this further.  I want to rule out gastric malignancy so I will refer her back to her GI specialist.  I will also discontinue Remeron due to the nightmares and replace it with Megace 400 mg daily as an appetite stimulant.  I want to recheck the patient's weight in 2 weeks.  I will also start Lexapro 10 mg a day as I do feel some of this is related to anxiety and depression and grief and loneliness.  05/31/21 Patient has an appointment to see the gastroenterologist on December 6.  However she has lost 3  additional pounds.  She was 109 the last  time I saw her 2 weeks ago she is 106 today.  However she states that she has gained 2 pounds since starting the Megace.  She states that her weight fell to 104 before she started the Megace and she has gained 2 pounds since taking it.  She is denying any abdominal pain today.  She is denying any nausea.  She still seems anxious.  She also has some age-related cognitive decline.  I believe that her weight loss is likely a combination of anxiety, depression, and grief coupled with dementia.  I am hoping that the Megace will help.  However she continues to lose weight and I feel that she would benefit from an EGD.  That appointment is still pending.  She also reports some dysuria.  Today on urinalysis she does have nitrates as well as leukocyte esterase. 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    GERD (gastroesophageal reflux disease)    H/O osteopenia    Hyperlipidemia    Hypertension    Hyperthyroidism    Nocturia    PONV (postoperative nausea and vomiting)    Renal cyst, congenital, right    Past Surgical History:  Procedure Laterality Date   ABDOMINAL HYSTERECTOMY     CHOLECYSTECTOMY     JOINT REPLACEMENT     KNEE ARTHROSCOPY     right and left   TOTAL HIP ARTHROPLASTY  11/14/2011   Procedure: TOTAL HIP ARTHROPLASTY;  Surgeon: Alta Corning, MD;  Location: [facility];  Service: Orthopedics;  Laterality: Left;   TOTAL KNEE ARTHROPLASTY Right 08/31/2012   Procedure: TOTAL KNEE ARTHROPLASTY;  Surgeon: Alta Corning, MD;  Location: [facility];  Service: Orthopedics;  Laterality: Right;   TUBAL LIGATION     Current Outpatient Medications on File Prior to Visit  Medication Sig Dispense Refill   clonazePAM (KLONOPIN) 0.5 MG tablet TAKE 1 TABLET(0.5 MG) BY MOUTH DAILY AS NEEDED FOR ANXIETY 20 tablet 0   cloNIDine (CATAPRES) 0.1 MG tablet TAKE 1 TABLET BY MOUTH 3  TIMES DAILY FOR  ELEVATED  BLOOD PRESSURE 270 tablet 3   escitalopram (LEXAPRO) 10 MG tablet Take 1 tablet (10 mg total) by mouth daily. 30 tablet 3   esomeprazole (NEXIUM) 40 MG capsule TAKE 1 CAPSULE(40 MG) BY MOUTH IN THE MORNING AND AT BEDTIME 60 capsule 2   fenofibrate 160 MG tablet TAKE 1 TABLET BY MOUTH  DAILY 90 tablet 3   hydrochlorothiazide (HYDRODIURIL) 25 MG tablet TAKE 1 TABLET BY MOUTH  DAILY 90 tablet 3   losartan (COZAAR) 100 MG tablet TAKE 1 TABLET BY MOUTH  DAILY 90 tablet 3   megestrol (MEGACE) 400 MG/10ML suspension Take 10 mLs (400 mg total) by mouth daily. 300 mL 1   metoprolol tartrate (LOPRESSOR) 50 MG tablet TAKE 1 TABLET BY MOUTH  TWICE DAILY 180 tablet 3   mirtazapine (REMERON SOL-TAB) 30 MG disintegrating tablet Take 1 tablet (30 mg total) by mouth at bedtime. (Patient not taking: Reported on 05/17/2021) 30 tablet 5   ondansetron (ZOFRAN) 4 MG tablet Take 1 tablet (4 mg total) by mouth every 8 (eight) hours as needed for nausea or vomiting. 60 tablet 0   pravastatin (PRAVACHOL) 80 MG tablet TAKE 1 TABLET BY MOUTH  DAILY 90 tablet 3   Probiotic Product (PROBIOTIC FORMULA  PO) Take 1 tablet by mouth daily.     sucralfate (CARAFATE) 1 g tablet TAKE 1 TABLET BY MOUTH IN THE MORNING, 1 TABLET AT NOON, AND 1 TABLET AT BEDTIME 270 tablet 1   No current facility-administered medications on file prior to visit.   Allergies  Allergen Reactions   Bee Venom Swelling   Social History   Socioeconomic History   Marital status: Widowed    Spouse name: Not on file   Number of children: Not on file   Years of education: Not on file   Highest education level: Not on file  Occupational History   Not on file  Tobacco Use   Smoking status: Never   Smokeless tobacco: Never  Vaping Use   Vaping Use: Never used  Substance and Sexual Activity   Alcohol use: No   Drug use: No   Sexual activity: Not Currently  Other Topics Concern   Not on file  Social History Narrative   Not on file    Social Determinants of Health   Financial Resource Strain: Not on file  Food Insecurity: Not on file  Transportation Needs: Not on file  Physical Activity: Not on file  Stress: Not on file  Social Connections: Not on file  Intimate Partner Violence: Not on file      Review of Systems  All other systems reviewed and are negative.     Objective:   Physical Exam Vitals reviewed. Exam conducted with a chaperone present.  Constitutional:      General: She is not in acute distress.    Appearance: Normal appearance. She is well-developed. She is not ill-appearing, toxic-appearing or diaphoretic.  Cardiovascular:     Rate and Rhythm: Normal rate and regular rhythm.     Pulses: Normal pulses.     Heart sounds: Normal heart sounds. No murmur heard.   No friction rub. No gallop.  Pulmonary:     Effort: Pulmonary effort is normal. No respiratory distress.     Breath sounds: No decreased breath sounds or wheezing.  Abdominal:     General: Abdomen is flat. Bowel sounds are normal. There is no distension.     Palpations: Abdomen is soft. There is no mass.     Tenderness: There is no abdominal tenderness. There is no guarding or rebound.  Musculoskeletal:     Cervical back: Neck supple.     Right lower leg: No edema.     Left lower leg: No edema.  Lymphadenopathy:     Cervical: No cervical adenopathy.  Neurological:     Mental Status: She is alert.          Assessment & Plan:  Weight loss - Plan: CBC with Differential/Platelet, COMPLETE METABOLIC PANEL WITH GFR, TSH  Epigastric pain  Nausea  Dysuria - Plan: Urinalysis, Routine w reflex microscopic Weight continues to fall.  As I stated earlier I believe is a combination of anxiety, grief, and dementia.  I am hoping that the combination of Lexapro and Megace will help.  However, the patient has had 2 normal CT scans of the abdomen and pelvis.  Lab work in August was unremarkable.  I will repeat the lab work today including  a CBC CMP and TSH.  She has an appointment to see GI on December 6.  I believe that the patient would benefit from an EGD to rule out any type of malignancy in the stomach.  Urinalysis is positive so we will start the patient on Keflex  500 mg 3 times daily for 5 days.

## 2021-06-01 ENCOUNTER — Other Ambulatory Visit: Payer: Self-pay

## 2021-06-01 DIAGNOSIS — E119 Type 2 diabetes mellitus without complications: Secondary | ICD-10-CM

## 2021-06-01 DIAGNOSIS — I10 Essential (primary) hypertension: Secondary | ICD-10-CM

## 2021-06-07 ENCOUNTER — Other Ambulatory Visit: Payer: Self-pay

## 2021-06-07 ENCOUNTER — Other Ambulatory Visit: Payer: Medicare Other

## 2021-06-07 DIAGNOSIS — I10 Essential (primary) hypertension: Secondary | ICD-10-CM | POA: Diagnosis not present

## 2021-06-07 DIAGNOSIS — E119 Type 2 diabetes mellitus without complications: Secondary | ICD-10-CM

## 2021-06-07 DIAGNOSIS — R300 Dysuria: Secondary | ICD-10-CM

## 2021-06-08 DIAGNOSIS — R1013 Epigastric pain: Secondary | ICD-10-CM | POA: Diagnosis not present

## 2021-06-08 DIAGNOSIS — R634 Abnormal weight loss: Secondary | ICD-10-CM | POA: Diagnosis not present

## 2021-06-08 DIAGNOSIS — K219 Gastro-esophageal reflux disease without esophagitis: Secondary | ICD-10-CM | POA: Diagnosis not present

## 2021-06-08 DIAGNOSIS — K5730 Diverticulosis of large intestine without perforation or abscess without bleeding: Secondary | ICD-10-CM | POA: Diagnosis not present

## 2021-06-08 LAB — COMPREHENSIVE METABOLIC PANEL
AG Ratio: 1.9 (calc) (ref 1.0–2.5)
ALT: 15 U/L (ref 6–29)
AST: 28 U/L (ref 10–35)
Albumin: 4.1 g/dL (ref 3.6–5.1)
Alkaline phosphatase (APISO): 19 U/L — ABNORMAL LOW (ref 37–153)
BUN/Creatinine Ratio: 21 (calc) (ref 6–22)
BUN: 24 mg/dL (ref 7–25)
CO2: 23 mmol/L (ref 20–32)
Calcium: 10.2 mg/dL (ref 8.6–10.4)
Chloride: 93 mmol/L — ABNORMAL LOW (ref 98–110)
Creat: 1.17 mg/dL — ABNORMAL HIGH (ref 0.60–1.00)
Globulin: 2.2 g/dL (calc) (ref 1.9–3.7)
Glucose, Bld: 90 mg/dL (ref 65–99)
Potassium: 4.3 mmol/L (ref 3.5–5.3)
Sodium: 126 mmol/L — ABNORMAL LOW (ref 135–146)
Total Bilirubin: 1 mg/dL (ref 0.2–1.2)
Total Protein: 6.3 g/dL (ref 6.1–8.1)

## 2021-06-11 DIAGNOSIS — K219 Gastro-esophageal reflux disease without esophagitis: Secondary | ICD-10-CM | POA: Diagnosis not present

## 2021-06-11 DIAGNOSIS — R634 Abnormal weight loss: Secondary | ICD-10-CM | POA: Diagnosis not present

## 2021-06-11 DIAGNOSIS — K2970 Gastritis, unspecified, without bleeding: Secondary | ICD-10-CM | POA: Diagnosis not present

## 2021-06-11 DIAGNOSIS — R1013 Epigastric pain: Secondary | ICD-10-CM | POA: Diagnosis not present

## 2021-06-18 ENCOUNTER — Encounter: Payer: Self-pay | Admitting: Family Medicine

## 2021-06-18 ENCOUNTER — Other Ambulatory Visit: Payer: Self-pay

## 2021-06-18 ENCOUNTER — Ambulatory Visit (INDEPENDENT_AMBULATORY_CARE_PROVIDER_SITE_OTHER): Payer: Medicare Other | Admitting: Family Medicine

## 2021-06-18 VITALS — BP 128/88 | HR 70 | Resp 18 | Ht 60.0 in | Wt 110.0 lb

## 2021-06-18 DIAGNOSIS — F341 Dysthymic disorder: Secondary | ICD-10-CM | POA: Insufficient documentation

## 2021-06-18 DIAGNOSIS — R634 Abnormal weight loss: Secondary | ICD-10-CM | POA: Insufficient documentation

## 2021-06-18 DIAGNOSIS — F32A Depression, unspecified: Secondary | ICD-10-CM

## 2021-06-18 DIAGNOSIS — R300 Dysuria: Secondary | ICD-10-CM | POA: Diagnosis not present

## 2021-06-18 DIAGNOSIS — F409 Phobic anxiety disorder, unspecified: Secondary | ICD-10-CM | POA: Insufficient documentation

## 2021-06-18 DIAGNOSIS — R410 Disorientation, unspecified: Secondary | ICD-10-CM

## 2021-06-18 DIAGNOSIS — K5904 Chronic idiopathic constipation: Secondary | ICD-10-CM | POA: Insufficient documentation

## 2021-06-18 DIAGNOSIS — F419 Anxiety disorder, unspecified: Secondary | ICD-10-CM | POA: Diagnosis not present

## 2021-06-18 MED ORDER — FLUOXETINE HCL 20 MG PO TABS: 20.0000 mg | ORAL_TABLET | Freq: Every day | ORAL | 3 refills | Status: DC

## 2021-06-18 NOTE — Progress Notes (Signed)
Subjective:    Patient ID: Jackie Lynch, female    DOB: [birth year], 80 y.o.   MRN: [id number]  HPI  11/16/20 Patient reports daily acid reflux.  She reports a burning sensation that comes up in her chest at times.  It radiates from below the xiphoid process up into her throat.  She also reports indigestion frequently when she eats.  She also reports frequent "salty wet burps".  It hurts worse when she lays down at night.  She has been taking Protonix twice a day per her report and states that this is not helping.  She denies any melena or hematochezia.  She denies any fevers or chills.  She denies any pleurisy or hemoptysis.  At that time, my plan was: Patient states that she has been taking Protonix every day.  I recommended that she switch to Nexium 40 mg twice a day because she states that that worked better for her in the past.  I also want her to use sucralfate 1 g p.o. q. ACH S..  Meanwhile check CBC, CMP, lipase.  Check H. pylori test.  Reassess in 2 weeks.  If not improving, will need EGD.  Also use Zofran 4 mg p.o. every 8 hours as needed nausea  12/01/20  Patient is a very sweet 80 year old Caucasian female here today for follow-up.  She has a past medical history of gastritis and GERD.  At her last visit I started her on twice daily Nexium and sucralfate 4 times daily and also gave her Zofran.  She states that the heartburn is some better.  However she continues to have a lot of nausea and she is asking for refill on the Zofran.  It seems like more of her symptoms at this point is constant nausea and upset stomach.  It seems like some of the burning and reflux have improved on the dual therapy.  She continues to deny any fever or melena or hematochezia.  On her lab work there was a mild elevation in her lipase which I do not believe is clinically significant.  My suspicions are possibly an ulcer versus gastritis versus IBS related to stress given the death of her husband versus less likely  malignancy in the stomach.  However at this point, her symptoms persist.  We discussed today whether or not she wanted to see her gastroenterologist for an EGD.  She states that this would make her feel more relieved if she can get "a clear bill of health".  At that time, my plan was:  Differential diagnosis includes GERD, gastritis, peptic ulcer disease, IBS related to stress given the recent passing of her husband, and less likely stomach malignancy.  However given the fact that her symptoms have persisted despite being on daily PPI therapy and now twice daily PPI therapy in addition to sucralfate, I will set up an appointment for her to see her gastroenterologist to discuss whether or not she needs an EGD to evaluate further to rule out underlying pathology in the stomach.  Continue Nexium and sucralfate for the time being.  I will refill her Zofran at her request.  I will also repeat her lipase.  If the lipase is persistently elevated we will need to image the pancreas.  02/25/21 CT scan in June was normal.  Wt Readings from Last 3 Encounters:  06/18/21 110 lb (49.9 kg)  05/31/21 106 lb (48.1 kg)  05/21/21 109 lb (49.4 kg)   Patient is lost considerable weight since I  last saw her.  She is lost 14 pounds.  She states that she is afraid to eat.  The reason she is afraid to eat is because she is "afraid to hurt".  She reports 2 episodes of severe burning pain in her epigastric area over the last few weeks.  She denies any melena.  She denies any hematochezia.  She states that she has been taking Nexium twice a day 40 mg as prescribed along with sucralfate 3 times a day.  Despite this she continues to have occasional epigastric pain and epigastric burning and a gnawing "hunger pain" in the subxiphoid area.  She has not yet seen the GI doctor.  She is not sure if she missed their phone call however no one has contacted her about an EGD.  She seems to be very anxious this morning.  She is very nervous.  She  denies any depression.  She denies any sadness.  But she does report to feeling anxious all the time.  She is taking Klonopin occasionally which I am concerned about given some mild memory impairment that I feel the patient has plus she lives alone and is unsteady on her feet.  At that time, my plan was:  Patient needs an EGD.  I am very concerned about her weight loss.  Repeat CMP today.  Patient's gallbladder has been removed per her report.  Therefore gallstones would be unlikely.  She is not jaundiced.  There is no evidence of any biliary tract disease on exam.  However I am concerned about the persistent burning sensation in the epigastric area despite the normal CT scan.  Therefore I am going to reach out to GI to contact her for an EGD as soon as possible.  Check CBC CMP and TSH because of weight loss.  If labs and EGD are normal, I would shift our focus to trying to better manage her anxiety as the anxiety itself could potentially be causing an upset stomach and weight loss due to poor appetite..  Meanwhile I will temporarily refill Klonopin  04/16/21 Saw GI who repeated CT scan of abd and pelvis which we ordered in June of this year: FINDINGS: Lower Chest: No acute findings.   Hepatobiliary: No hepatic masses identified. Prior cholecystectomy. No evidence of biliary obstruction.   Pancreas:  No mass or inflammatory changes.   Spleen: Within normal limits in size and appearance.   Adrenals/Urinary Tract: Several simple renal cysts are again seen bilaterally. No masses identified. No evidence of ureteral calculi or hydronephrosis.   Stomach/Bowel: No evidence of obstruction, inflammatory process or abnormal fluid collections. Normal appendix visualized.   Vascular/Lymphatic: No pathologically enlarged lymph nodes. No acute vascular findings. Aortic atherosclerotic calcification noted.   Reproductive: Prior hysterectomy noted. Adnexal regions are unremarkable in appearance.    Other: Significant artifact through the inferior pelvis noted from left hip prosthesis.   Musculoskeletal:  No suspicious bone lesions identified.   IMPRESSION: No evidence of neoplasm or other acute findings within the abdomen or pelvis.   Aortic Atherosclerosis (ICD10-I70.0).  Wt Readings from Last 3 Encounters:  06/18/21 110 lb (49.9 kg)  05/31/21 106 lb (48.1 kg)  05/21/21 109 lb (49.4 kg)   Patient states that she is scared to eat.  Initially when I first started seeing the patient she was having burning nagging epigastric pain.  Every time she eats she would have pain similar to an ulcer.  However the combination of Nexium and sucralfate has improved that.  She  is no longer having the burning epigastric pain.  However she is afraid that she will and therefore she is avoiding most foods.  She seems extremely anxious today.  She is also slightly confused.  She is unable to tell me what the gastroenterologist said at that visit.  I see the CT scan but I do not see any plans for an EGD.  Patient is obviously anxious and has used up almost all of her Klonopin.  She denies any trouble sleeping or suicidal ideation.  She does report depression and grief over the death of her husband.  At that time, my plan was:  Patient has had a thorough diagnostic work-up including 2 CAT scans in 4 months that are normal.  The only work-up thus far that has not occurred is an EGD.  She is seeing a gastroenterologist who has deferred that for now.  I truly believe that the majority this is likely anxiety and depression and grief affecting her appetite.  I believe that her symptoms initially began with GERD or gastritis or even an ulcer but I believe that this has gradually improved on the PPI and sucralfate.  Now I feel that she is dealing most likely with anxiety and anorexia related to anxiety and depression.  Therefore I will start the patient on Remeron 30 mg p.o. nightly and recheck the patient in 1 month to  see if her weight is improving.  I chose this 1 also because of its appetite stimulating properties.  Also encourage the patient to eat a high carbohydrate diet including bread, pastas, macaroni and cheese, mashed potatoes due to the caloric intake to try to increase her weight gain.  Recheck immediately if worsening  05/17/21 Wt Readings from Last 3 Encounters:  06/18/21 110 lb (49.9 kg)  05/31/21 106 lb (48.1 kg)  05/21/21 109 lb (49.4 kg)   After her last visit, the patient has lost 8 pounds.  She is down from 1 19-1 11.  She continues to have nausea whenever she eats.  She also reports a burning discomfort in her epigastric area whenever she eats.  As result she is afraid to try to eat any substantial food.  She is primarily just grazing and eating soups and crackers.  She is not eating substantial meals.  She denies any fevers or chills.  She took the Remeron but stopped it after less than a week due to nightmares.  She continues to report feeling anxious and jittery.  I am also concerned that she has mild age-related cognitive decline that could be affecting her as well.  At that time, my plan was:  I fear her weight loss is multifactorial but primarily due to poor appetite and decreased oral intake.  However she has had 2 normal CAT scans and continues to have nausea and epigastric pain with food.  Therefore I do feel that she would require an EGD to evaluate this further.  I want to rule out gastric malignancy so I will refer her back to her GI specialist.  I will also discontinue Remeron due to the nightmares and replace it with Megace 400 mg daily as an appetite stimulant.  I want to recheck the patient's weight in 2 weeks.  I will also start Lexapro 10 mg a day as I do feel some of this is related to anxiety and depression and grief and loneliness.  05/31/21 Patient has an appointment to see the gastroenterologist on December 6.  However she has lost 3  additional pounds.  She was 109 the last  time I saw her 2 weeks ago she is 106 today.  However she states that she has gained 2 pounds since starting the Megace.  She states that her weight fell to 104 before she started the Megace and she has gained 2 pounds since taking it.  She is denying any abdominal pain today.  She is denying any nausea.  She still seems anxious.  She also has some age-related cognitive decline.  I believe that her weight loss is likely a combination of anxiety, depression, and grief coupled with dementia.  I am hoping that the Megace will help.  However she continues to lose weight and I feel that she would benefit from an EGD.  That appointment is still pending.  She also reports some dysuria.  Today on urinalysis she does have nitrates as well as leukocyte esterase.  AT that time, my plan was: Weight continues to fall.  As I stated earlier I believe is a combination of anxiety, grief, and dementia.  I am hoping that the combination of Lexapro and Megace will help.  However, the patient has had 2 normal CT scans of the abdomen and pelvis.  Lab work in August was unremarkable.  I will repeat the lab work today including a CBC CMP and TSH.  She has an appointment to see GI on December 6.  I believe that the patient would benefit from an EGD to rule out any type of malignancy in the stomach.  Urinalysis is positive so we will start the patient on Keflex 500 mg 3 times daily for 5 days.  06/18/21 Since I last saw the patient, she has seen GI and they performed an EGD.  I have not yet received results however the patient reports that the EGD was completely normal.  She glean from the encounter that the gastroenterologist feels that anxiety is likely causing her weight loss.  I tend to agree.  She has been on Lexapro now for 1 month but she states that she had to stop the Lexapro because it made her feel nervous and jittery.  She seems increasingly confused today.  She frequently refers medicines that she has stopped without knowing  what the medicine he has.  She felt that the Keflex was for anxiety.  Therefore I am not certain what medication the patient is taking.  I truly believe that the biggest issue we are dealing with right now is failure to thrive due to combination of depression, anxiety, and dementia any confusion. 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    GERD (gastroesophageal reflux disease)    H/O osteopenia    Hyperlipidemia    Hypertension    Hyperthyroidism    Nocturia    PONV (postoperative nausea and vomiting)    Renal cyst, congenital, right    Past Surgical History:  Procedure Laterality Date   ABDOMINAL HYSTERECTOMY     CHOLECYSTECTOMY     JOINT REPLACEMENT     KNEE ARTHROSCOPY     right and left   TOTAL HIP ARTHROPLASTY  11/14/2011   Procedure: TOTAL HIP ARTHROPLASTY;  Surgeon: Alta Corning, MD;  Location: [facility];  Service: Orthopedics;  Laterality: Left;   TOTAL KNEE ARTHROPLASTY Right 08/31/2012   Procedure: TOTAL KNEE ARTHROPLASTY;  Surgeon: Alta Corning, MD;  Location: [facility];  Service: Orthopedics;  Laterality: Right;   TUBAL LIGATION     Current Outpatient Medications on File Prior to Visit  Medication Sig Dispense Refill   amLODipine (NORVASC) 5 MG tablet Take 5 mg by mouth daily.     clonazePAM (KLONOPIN) 0.5 MG tablet TAKE 1 TABLET(0.5 MG) BY MOUTH DAILY AS NEEDED FOR ANXIETY 20 tablet 0   cloNIDine (CATAPRES) 0.1 MG tablet TAKE 1 TABLET BY MOUTH 3  TIMES DAILY FOR ELEVATED  BLOOD PRESSURE 270 tablet 3   esomeprazole (NEXIUM) 40 MG capsule TAKE 1 CAPSULE(40 MG) BY MOUTH IN THE MORNING AND AT BEDTIME 60 capsule 2   fenofibrate 160 MG tablet TAKE 1 TABLET BY MOUTH  DAILY 90 tablet 3   hydrochlorothiazide (HYDRODIURIL) 25 MG tablet TAKE 1 TABLET BY MOUTH  DAILY 90 tablet 3   hydrocortisone 2.5 % cream Apply topically 2 (two) times daily.     losartan (COZAAR)  100 MG tablet TAKE 1 TABLET BY MOUTH  DAILY 90 tablet 3   megestrol (MEGACE) 400 MG/10ML suspension Take 10 mLs (400 mg total) by mouth daily. 300 mL 1   methimazole (TAPAZOLE) 5 MG tablet Take by mouth.     metoprolol tartrate (LOPRESSOR) 50 MG tablet TAKE 1 TABLET BY MOUTH  TWICE DAILY 180 tablet 3   ondansetron (ZOFRAN) 4 MG tablet Take 1 tablet (4 mg total) by mouth every 8 (eight) hours as needed for nausea or vomiting. 60 tablet 0   pravastatin (PRAVACHOL) 80 MG tablet TAKE 1 TABLET BY MOUTH  DAILY 90 tablet 3   Probiotic Product (PROBIOTIC FORMULA PO) Take 1 tablet by mouth daily.     sucralfate (CARAFATE) 1 g tablet TAKE 1 TABLET BY MOUTH IN THE MORNING, 1 TABLET AT NOON, AND 1 TABLET AT BEDTIME 270 tablet 1   No current facility-administered medications on file prior to visit.   Allergies  Allergen Reactions   Bee Venom Swelling   Social History   Socioeconomic History   Marital status: Widowed    Spouse name: Not on file   Number of children: Not on file   Years of education: Not on file   Highest education level: Not on file  Occupational History   Not on file  Tobacco Use   Smoking status: Never   Smokeless tobacco: Never  Vaping Use   Vaping Use: Never used  Substance and Sexual Activity   Alcohol use: No   Drug use: No   Sexual activity: Not Currently  Other Topics Concern   Not on file  Social History Narrative   Not on file   Social Determinants of Health   Financial Resource Strain: Not on file  Food Insecurity: Not on file  Transportation Needs: Not on file  Physical Activity: Not on file  Stress: Not on file  Social Connections: Not on file  Intimate Partner Violence: Not on file      Review of Systems  All other systems reviewed and are negative.     Objective:   Physical Exam Vitals reviewed. Exam conducted with a chaperone present.  Constitutional:      General: She is not in acute distress.    Appearance: Normal appearance. She is  well-developed. She is not ill-appearing, toxic-appearing or diaphoretic.  Cardiovascular:     Rate and Rhythm: Normal rate and regular rhythm.     Pulses: Normal pulses.     Heart sounds: Normal heart sounds. No murmur heard.   No  friction rub. No gallop.  Pulmonary:     Effort: Pulmonary effort is normal. No respiratory distress.     Breath sounds: No decreased breath sounds or wheezing.  Abdominal:     General: Abdomen is flat. Bowel sounds are normal. There is no distension.     Palpations: Abdomen is soft. There is no mass.     Tenderness: There is no abdominal tenderness. There is no guarding or rebound.  Musculoskeletal:     Cervical back: Neck supple.     Right lower leg: No edema.     Left lower leg: No edema.  Lymphadenopathy:     Cervical: No cervical adenopathy.  Neurological:     Mental Status: She is alert.          Assessment & Plan:  Weight loss  Dysuria - Plan: Urine Culture  Confusion  Anxiety and depression Patient is oriented to person and place and time.  However she is very confused about her medication and has a difficult time answering my questions about the test she has had.  She also has a difficult time explaining what medicine she is on and what medicine she has stopped.  Therefore of asked the patient to go home today and call back and speak to my nurse and tell her exactly what medication she is taking and why she feels that she is taking the medicine.  I believe that the patient is taking sucralfate for anxiety based on her explanation and she may be confusing Klonopin.  I am also not certain that she has stopped Remeron or even if she still taking Lexapro.  Therefore I want to verify exactly what she is supposed to be on.  I have asked the patient to stop mirtazapine and Lexapro since they have not helped.  I want to start her on Prozac 20 mg a day and then clarify what medicine she is taking to ensure she is taking it properly.  I will also repeat a  urine culture to ensure resolution of the UTI particularly given some of the confusion

## 2021-06-20 LAB — URINE CULTURE
MICRO NUMBER:: 12767548
SPECIMEN QUALITY:: ADEQUATE

## 2021-06-21 ENCOUNTER — Other Ambulatory Visit: Payer: Self-pay

## 2021-06-21 ENCOUNTER — Telehealth: Payer: Self-pay

## 2021-06-21 ENCOUNTER — Other Ambulatory Visit: Payer: Self-pay | Admitting: Family Medicine

## 2021-06-21 MED ORDER — CIPROFLOXACIN HCL 250 MG PO TABS: 250.0000 mg | ORAL_TABLET | Freq: Two times a day (BID) | ORAL | 0 refills | Status: AC

## 2021-06-21 NOTE — Telephone Encounter (Signed)
Informed patient of results and recommendations. 

## 2021-06-21 NOTE — Telephone Encounter (Signed)
Pt's med list reconciled with daughter, Lynelle Smoke.   Also received message that Prozac is not covered by pt's insurance in tablet form, must be in capsule form. Called pharmacy and switched this.   Please see updated med list. Thanks!

## 2021-06-21 NOTE — Telephone Encounter (Signed)
-----   Message from Susy Frizzle, MD sent at 06/21/2021  7:00 AM EST ----- Urine culture shows E. coli.  I am not sure if she is colonized or if she could have a urinary tract infection.  I will start her on Cipro 250 mg twice daily for a week and then I would like to recheck the urine culture.  If it is persistently present, she is likely colonized and further antibiotics will not help.

## 2021-06-22 NOTE — Telephone Encounter (Signed)
Spoke with pt's daughter. She states pt has started abtx and will come in for another UA once completed. She is aware to schedule ROV once pt has been on Prozac for 1 month. Nothing further needed at this time.

## 2021-07-01 DIAGNOSIS — E0590 Thyrotoxicosis, unspecified without thyrotoxic crisis or storm: Secondary | ICD-10-CM | POA: Diagnosis not present

## 2021-07-02 ENCOUNTER — Other Ambulatory Visit: Payer: Self-pay | Admitting: Family Medicine

## 2021-07-02 ENCOUNTER — Encounter: Payer: Self-pay | Admitting: Family Medicine

## 2021-07-02 ENCOUNTER — Other Ambulatory Visit: Payer: Self-pay

## 2021-07-02 ENCOUNTER — Ambulatory Visit (INDEPENDENT_AMBULATORY_CARE_PROVIDER_SITE_OTHER): Payer: Medicare Other | Admitting: Family Medicine

## 2021-07-02 VITALS — BP 116/78 | HR 82 | Temp 97.6°F | Resp 16 | Ht 60.0 in | Wt 112.2 lb

## 2021-07-02 DIAGNOSIS — E611 Iron deficiency: Secondary | ICD-10-CM | POA: Diagnosis not present

## 2021-07-02 DIAGNOSIS — R300 Dysuria: Secondary | ICD-10-CM | POA: Diagnosis not present

## 2021-07-02 DIAGNOSIS — E871 Hypo-osmolality and hyponatremia: Secondary | ICD-10-CM | POA: Diagnosis not present

## 2021-07-02 MED ORDER — MEGESTROL ACETATE 400 MG/10ML PO SUSP: 400.0000 mg | Freq: Every day | ORAL | 1 refills | Status: AC

## 2021-07-02 NOTE — Progress Notes (Signed)
Subjective:    Patient ID: Jackie Lynch, female    DOB: 08/16/1940, 80 y.o.   MRN: [id number]  HPI  11/16/20 Patient reports daily acid reflux.  She reports a burning sensation that comes up in her chest at times.  It radiates from below the xiphoid process up into her throat.  She also reports indigestion frequently when she eats.  She also reports frequent "salty wet burps".  It hurts worse when she lays down at night.  She has been taking Protonix twice a day per her report and states that this is not helping.  She denies any melena or hematochezia.  She denies any fevers or chills.  She denies any pleurisy or hemoptysis.  At that time, my plan was: Patient states that she has been taking Protonix every day.  I recommended that she switch to Nexium 40 mg twice a day because she states that that worked better for her in the past.  I also want her to use sucralfate 1 g p.o. q. ACH S..  Meanwhile check CBC, CMP, lipase.  Check H. pylori test.  Reassess in 2 weeks.  If not improving, will need EGD.  Also use Zofran 4 mg p.o. every 8 hours as needed nausea  12/01/20  Patient is a very sweet 80 year old Caucasian female here today for follow-up.  She has a past medical history of gastritis and GERD.  At her last visit I started her on twice daily Nexium and sucralfate 4 times daily and also gave her Zofran.  She states that the heartburn is some better.  However she continues to have a lot of nausea and she is asking for refill on the Zofran.  It seems like more of her symptoms at this point is constant nausea and upset stomach.  It seems like some of the burning and reflux have improved on the dual therapy.  She continues to deny any fever or melena or hematochezia.  On her lab work there was a mild elevation in her lipase which I do not believe is clinically significant.  My suspicions are possibly an ulcer versus gastritis versus IBS related to stress given the death of her husband versus less likely  malignancy in the stomach.  However at this point, her symptoms persist.  We discussed today whether or not she wanted to see her gastroenterologist for an EGD.  She states that this would make her feel more relieved if she can get "a clear bill of health".  At that time, my plan was:  Differential diagnosis includes GERD, gastritis, peptic ulcer disease, IBS related to stress given the recent passing of her husband, and less likely stomach malignancy.  However given the fact that her symptoms have persisted despite being on daily PPI therapy and now twice daily PPI therapy in addition to sucralfate, I will set up an appointment for her to see her gastroenterologist to discuss whether or not she needs an EGD to evaluate further to rule out underlying pathology in the stomach.  Continue Nexium and sucralfate for the time being.  I will refill her Zofran at her request.  I will also repeat her lipase.  If the lipase is persistently elevated we will need to image the pancreas.  02/25/21 CT scan in June was normal.  Wt Readings from Last 3 Encounters:  07/02/21 112 lb 3.2 oz (50.9 kg)  06/18/21 110 lb (49.9 kg)  05/31/21 106 lb (48.1 kg)   Patient is lost considerable weight  since I last saw her.  She is lost 14 pounds.  She states that she is afraid to eat.  The reason she is afraid to eat is because she is "afraid to hurt".  She reports 2 episodes of severe burning pain in her epigastric area over the last few weeks.  She denies any melena.  She denies any hematochezia.  She states that she has been taking Nexium twice a day 40 mg as prescribed along with sucralfate 3 times a day.  Despite this she continues to have occasional epigastric pain and epigastric burning and a gnawing "hunger pain" in the subxiphoid area.  She has not yet seen the GI doctor.  She is not sure if she missed their phone call however no one has contacted her about an EGD.  She seems to be very anxious this morning.  She is very  nervous.  She denies any depression.  She denies any sadness.  But she does report to feeling anxious all the time.  She is taking Klonopin occasionally which I am concerned about given some mild memory impairment that I feel the patient has plus she lives alone and is unsteady on her feet.  At that time, my plan was:  Patient needs an EGD.  I am very concerned about her weight loss.  Repeat CMP today.  Patient's gallbladder has been removed per her report.  Therefore gallstones would be unlikely.  She is not jaundiced.  There is no evidence of any biliary tract disease on exam.  However I am concerned about the persistent burning sensation in the epigastric area despite the normal CT scan.  Therefore I am going to reach out to GI to contact her for an EGD as soon as possible.  Check CBC CMP and TSH because of weight loss.  If labs and EGD are normal, I would shift our focus to trying to better manage her anxiety as the anxiety itself could potentially be causing an upset stomach and weight loss due to poor appetite..  Meanwhile I will temporarily refill Klonopin  04/16/21 Saw GI who repeated CT scan of abd and pelvis which we ordered in June of this year: FINDINGS: Lower Chest: No acute findings.   Hepatobiliary: No hepatic masses identified. Prior cholecystectomy. No evidence of biliary obstruction.   Pancreas:  No mass or inflammatory changes.   Spleen: Within normal limits in size and appearance.   Adrenals/Urinary Tract: Several simple renal cysts are again seen bilaterally. No masses identified. No evidence of ureteral calculi or hydronephrosis.   Stomach/Bowel: No evidence of obstruction, inflammatory process or abnormal fluid collections. Normal appendix visualized.   Vascular/Lymphatic: No pathologically enlarged lymph nodes. No acute vascular findings. Aortic atherosclerotic calcification noted.   Reproductive: Prior hysterectomy noted. Adnexal regions are unremarkable in  appearance.   Other: Significant artifact through the inferior pelvis noted from left hip prosthesis.   Musculoskeletal:  No suspicious bone lesions identified.   IMPRESSION: No evidence of neoplasm or other acute findings within the abdomen or pelvis.   Aortic Atherosclerosis (ICD10-I70.0).  Wt Readings from Last 3 Encounters:  07/02/21 112 lb 3.2 oz (50.9 kg)  06/18/21 110 lb (49.9 kg)  05/31/21 106 lb (48.1 kg)   Patient states that she is scared to eat.  Initially when I first started seeing the patient she was having burning nagging epigastric pain.  Every time she eats she would have pain similar to an ulcer.  However the combination of Nexium and sucralfate has  improved that.  She is no longer having the burning epigastric pain.  However she is afraid that she will and therefore she is avoiding most foods.  She seems extremely anxious today.  She is also slightly confused.  She is unable to tell me what the gastroenterologist said at that visit.  I see the CT scan but I do not see any plans for an EGD.  Patient is obviously anxious and has used up almost all of her Klonopin.  She denies any trouble sleeping or suicidal ideation.  She does report depression and grief over the death of her husband.  At that time, my plan was:  Patient has had a thorough diagnostic work-up including 2 CAT scans in 4 months that are normal.  The only work-up thus far that has not occurred is an EGD.  She is seeing a gastroenterologist who has deferred that for now.  I truly believe that the majority this is likely anxiety and depression and grief affecting her appetite.  I believe that her symptoms initially began with GERD or gastritis or even an ulcer but I believe that this has gradually improved on the PPI and sucralfate.  Now I feel that she is dealing most likely with anxiety and anorexia related to anxiety and depression.  Therefore I will start the patient on Remeron 30 mg p.o. nightly and recheck the  patient in 1 month to see if her weight is improving.  I chose this 1 also because of its appetite stimulating properties.  Also encourage the patient to eat a high carbohydrate diet including bread, pastas, macaroni and cheese, mashed potatoes due to the caloric intake to try to increase her weight gain.  Recheck immediately if worsening  05/17/21 Wt Readings from Last 3 Encounters:  07/02/21 112 lb 3.2 oz (50.9 kg)  06/18/21 110 lb (49.9 kg)  05/31/21 106 lb (48.1 kg)   After her last visit, the patient has lost 8 pounds.  She is down from 1 19-1 11.  She continues to have nausea whenever she eats.  She also reports a burning discomfort in her epigastric area whenever she eats.  As result she is afraid to try to eat any substantial food.  She is primarily just grazing and eating soups and crackers.  She is not eating substantial meals.  She denies any fevers or chills.  She took the Remeron but stopped it after less than a week due to nightmares.  She continues to report feeling anxious and jittery.  I am also concerned that she has mild age-related cognitive decline that could be affecting her as well.  At that time, my plan was:  I fear her weight loss is multifactorial but primarily due to poor appetite and decreased oral intake.  However she has had 2 normal CAT scans and continues to have nausea and epigastric pain with food.  Therefore I do feel that she would require an EGD to evaluate this further.  I want to rule out gastric malignancy so I will refer her back to her GI specialist.  I will also discontinue Remeron due to the nightmares and replace it with Megace 400 mg daily as an appetite stimulant.  I want to recheck the patient's weight in 2 weeks.  I will also start Lexapro 10 mg a day as I do feel some of this is related to anxiety and depression and grief and loneliness.  05/31/21 Patient has an appointment to see the gastroenterologist on December 6.  However she has lost 3 additional  pounds.  She was 109 the last time I saw her 2 weeks ago she is 106 today.  However she states that she has gained 2 pounds since starting the Megace.  She states that her weight fell to 104 before she started the Megace and she has gained 2 pounds since taking it.  She is denying any abdominal pain today.  She is denying any nausea.  She still seems anxious.  She also has some age-related cognitive decline.  I believe that her weight loss is likely a combination of anxiety, depression, and grief coupled with dementia.  I am hoping that the Megace will help.  However she continues to lose weight and I feel that she would benefit from an EGD.  That appointment is still pending.  She also reports some dysuria.  Today on urinalysis she does have nitrates as well as leukocyte esterase.  AT that time, my plan was: Weight continues to fall.  As I stated earlier I believe is a combination of anxiety, grief, and dementia.  I am hoping that the combination of Lexapro and Megace will help.  However, the patient has had 2 normal CT scans of the abdomen and pelvis.  Lab work in August was unremarkable.  I will repeat the lab work today including a CBC CMP and TSH.  She has an appointment to see GI on December 6.  I believe that the patient would benefit from an EGD to rule out any type of malignancy in the stomach.  Urinalysis is positive so we will start the patient on Keflex 500 mg 3 times daily for 5 days.  06/18/21 Since I last saw the patient, she has seen GI and they performed an EGD.  I have not yet received results however the patient reports that the EGD was completely normal.  She glean from the encounter that the gastroenterologist feels that anxiety is likely causing her weight loss.  I tend to agree.  She has been on Lexapro now for 1 month but she states that she had to stop the Lexapro because it made her feel nervous and jittery.  She seems increasingly confused today.  She frequently refers medicines that  she has stopped without knowing what the medicine he has.  She felt that the Keflex was for anxiety.  Therefore I am not certain what medication the patient is taking.  I truly believe that the biggest issue we are dealing with right now is failure to thrive due to combination of depression, anxiety, and dementia any confusion.  At that time, my plan was:  Patient is oriented to person and place and time.  However she is very confused about her medication and has a difficult time answering my questions about the test she has had.  She also has a difficult time explaining what medicine she is on and what medicine she has stopped.  Therefore of asked the patient to go home today and call back and speak to my nurse and tell her exactly what medication she is taking and why she feels that she is taking the medicine.  I believe that the patient is taking sucralfate for anxiety based on her explanation and she may be confusing Klonopin.  I am also not certain that she has stopped Remeron or even if she still taking Lexapro.  Therefore I want to verify exactly what she is supposed to be on.  I have asked the patient to stop  mirtazapine and Lexapro since they have not helped.  I want to start her on Prozac 20 mg a day and then clarify what medicine she is taking to ensure she is taking it properly.  I will also repeat a urine culture to ensure resolution of the UTI particularly given some of the confusion  07/02/32 Patient has been on Prozac for 2 weeks he continues to take Megace.  She continues to gain weight.  Her weight is up 2 pounds from her last visit and 6 pounds since the Megace was initiated.  She is tolerating the Prozac.  She still appears confused and disoriented.  I believe that this is her baseline.  She is also anxious.  She is here today with family member.  We went through everything that has occurred an abrupt family member up to speed particularly about her dementia and anxiety and depression and  hyponatremia.  The patient saw her endocrinologist this morning and was told that her iron level was low.  She also has hyponatremia and she is due to recheck sodium level. 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    GERD (gastroesophageal reflux disease)    H/O osteopenia    Hyperlipidemia    Hypertension    Hyperthyroidism    Nocturia    PONV (postoperative nausea and vomiting)    Renal cyst, congenital, right    Past Surgical History:  Procedure Laterality Date   ABDOMINAL HYSTERECTOMY     CHOLECYSTECTOMY     JOINT REPLACEMENT     KNEE ARTHROSCOPY     right and left   TOTAL HIP ARTHROPLASTY  11/14/2011   Procedure: TOTAL HIP ARTHROPLASTY;  Surgeon: Alta Corning, MD;  Location: [facility];  Service: Orthopedics;  Laterality: Left;   TOTAL KNEE ARTHROPLASTY Right 08/31/2012   Procedure: TOTAL KNEE ARTHROPLASTY;  Surgeon: Alta Corning, MD;  Location: [facility];  Service: Orthopedics;  Laterality: Right;   TUBAL LIGATION     Current Outpatient Medications on File Prior to Visit  Medication Sig Dispense Refill   amLODipine (NORVASC) 5 MG tablet Take 5 mg by mouth daily.     cloNIDine (CATAPRES) 0.1 MG tablet TAKE 1 TABLET BY MOUTH 3  TIMES DAILY FOR ELEVATED  BLOOD PRESSURE 270 tablet 3   esomeprazole (NEXIUM) 40 MG capsule TAKE 1 CAPSULE(40 MG) BY MOUTH IN THE MORNING AND AT BEDTIME 60 capsule 2   fenofibrate 160 MG tablet TAKE 1 TABLET BY MOUTH  DAILY 90 tablet 3   FLUoxetine (PROZAC) 20 MG tablet Take 1 tablet (20 mg total) by mouth daily. 90 tablet 3   hydrocortisone 2.5 % cream Apply topically 2 (two) times daily.     losartan (COZAAR) 100 MG tablet TAKE 1 TABLET BY MOUTH  DAILY 90 tablet 3   megestrol (MEGACE) 400 MG/10ML suspension Take 10 mLs (400 mg total) by mouth daily. 300 mL 1   metoprolol tartrate (LOPRESSOR) 50 MG tablet TAKE 1 TABLET BY MOUTH  TWICE DAILY  180 tablet 3   ondansetron (ZOFRAN) 4 MG tablet Take 1 tablet (4 mg total) by mouth every 8 (eight) hours as needed for nausea or vomiting. 60 tablet 0   pravastatin (PRAVACHOL) 80 MG tablet TAKE 1 TABLET BY MOUTH  DAILY 90 tablet 3   Probiotic Product (PROBIOTIC FORMULA PO) Take 1 tablet by mouth  daily.     sucralfate (CARAFATE) 1 g tablet TAKE 1 TABLET BY MOUTH IN THE MORNING, 1 TABLET AT NOON, AND 1 TABLET AT BEDTIME 270 tablet 1   No current facility-administered medications on file prior to visit.   Allergies  Allergen Reactions   Bee Venom Swelling   Social History   Socioeconomic History   Marital status: Widowed    Spouse name: Not on file   Number of children: Not on file   Years of education: Not on file   Highest education level: Not on file  Occupational History   Not on file  Tobacco Use   Smoking status: Never   Smokeless tobacco: Never  Vaping Use   Vaping Use: Never used  Substance and Sexual Activity   Alcohol use: No   Drug use: No   Sexual activity: Not Currently  Other Topics Concern   Not on file  Social History Narrative   Not on file   Social Determinants of Health   Financial Resource Strain: Not on file  Food Insecurity: Not on file  Transportation Needs: Not on file  Physical Activity: Not on file  Stress: Not on file  Social Connections: Not on file  Intimate Partner Violence: Not on file      Review of Systems  All other systems reviewed and are negative.     Objective:   Physical Exam Vitals reviewed. Exam conducted with a chaperone present.  Constitutional:      General: She is not in acute distress.    Appearance: Normal appearance. She is well-developed. She is not ill-appearing, toxic-appearing or diaphoretic.  Cardiovascular:     Rate and Rhythm: Normal rate and regular rhythm.     Pulses: Normal pulses.     Heart sounds: Normal heart sounds. No murmur heard.   No friction rub. No gallop.  Pulmonary:     Effort:  Pulmonary effort is normal. No respiratory distress.     Breath sounds: No decreased breath sounds or wheezing.  Abdominal:     General: Abdomen is flat. Bowel sounds are normal. There is no distension.     Palpations: Abdomen is soft. There is no mass.     Tenderness: There is no abdominal tenderness. There is no guarding or rebound.  Musculoskeletal:     Cervical back: Neck supple.     Right lower leg: No edema.     Left lower leg: No edema.  Lymphadenopathy:     Cervical: No cervical adenopathy.  Neurological:     Mental Status: She is alert.          Assessment & Plan:  Hyponatremia - Plan: COMPLETE METABOLIC PANEL WITH GFR  Iron deficiency - Plan: COMPLETE METABOLIC PANEL WITH GFR, CBC with Differential/Platelet, Iron  Dysuria - Plan: Urine Culture  I feel that the patient is likely colonized with bacteria and that bacteriuria is not contributing to her confusion.  I believe that her confusion is most likely due to her dementia and may be exacerbated by hyponatremia.  I recommended a 40 ounce a day fluid restriction.  I also recommended that she liberalize her diet to try to get more protein and fat in her diet.  I will check a CMP to monitor her sodium level.  I will also check a urine culture.  I will repeat a CBC and an iron level given the history of being told that she has low iron this morning to determine if further work-up is necessary

## 2021-07-03 LAB — COMPLETE METABOLIC PANEL WITH GFR
AG Ratio: 1.8 (calc) (ref 1.0–2.5)
ALT: 13 U/L (ref 6–29)
AST: 29 U/L (ref 10–35)
Albumin: 3.9 g/dL (ref 3.6–5.1)
Alkaline phosphatase (APISO): 14 U/L — ABNORMAL LOW (ref 37–153)
BUN/Creatinine Ratio: 23 (calc) — ABNORMAL HIGH (ref 6–22)
BUN: 25 mg/dL (ref 7–25)
CO2: 22 mmol/L (ref 20–32)
Calcium: 9.8 mg/dL (ref 8.6–10.4)
Chloride: 99 mmol/L (ref 98–110)
Creat: 1.1 mg/dL — ABNORMAL HIGH (ref 0.60–1.00)
Globulin: 2.2 g/dL (calc) (ref 1.9–3.7)
Glucose, Bld: 116 mg/dL — ABNORMAL HIGH (ref 65–99)
Potassium: 3.5 mmol/L (ref 3.5–5.3)
Sodium: 131 mmol/L — ABNORMAL LOW (ref 135–146)
Total Bilirubin: 0.8 mg/dL (ref 0.2–1.2)
Total Protein: 6.1 g/dL (ref 6.1–8.1)
eGFR: 51 mL/min/{1.73_m2} — ABNORMAL LOW (ref >=60)

## 2021-07-03 LAB — CBC WITH DIFFERENTIAL/PLATELET
Absolute Monocytes: 420 cells/uL (ref 200–950)
Basophils Absolute: 62 cells/uL (ref 0–200)
Basophils Relative: 1.1 %
Eosinophils Absolute: 207 cells/uL (ref 15–500)
Eosinophils Relative: 3.7 %
HCT: 28.7 % — ABNORMAL LOW (ref 35.0–45.0)
Hemoglobin: 9.5 g/dL — ABNORMAL LOW (ref 11.7–15.5)
Lymphs Abs: 3175 cells/uL (ref 850–3900)
MCH: 29.7 pg (ref 27.0–33.0)
MCHC: 33.1 g/dL (ref 32.0–36.0)
MCV: 89.7 fL (ref 80.0–100.0)
MPV: 12.8 fL — ABNORMAL HIGH (ref 7.5–12.5)
Monocytes Relative: 7.5 %
Neutro Abs: 1736 cells/uL (ref 1500–7800)
Neutrophils Relative %: 31 %
Platelets: 206 10*3/uL (ref 140–400)
RBC: 3.2 10*6/uL — ABNORMAL LOW (ref 3.80–5.10)
RDW: 13.4 % (ref 11.0–15.0)
Total Lymphocyte: 56.7 %
WBC: 5.6 10*3/uL (ref 3.8–10.8)

## 2021-07-03 LAB — URINE CULTURE
MICRO NUMBER:: 12813526
Result:: NO GROWTH
SPECIMEN QUALITY:: ADEQUATE

## 2021-07-03 LAB — IRON: Iron: 82 ug/dL (ref 45–160)

## 2021-07-07 ENCOUNTER — Telehealth: Payer: Self-pay | Admitting: Family Medicine

## 2021-07-07 NOTE — Telephone Encounter (Signed)
Patient called to request call back with recent lab results. Please advise at [phone] or [phone]

## 2021-07-07 NOTE — Telephone Encounter (Signed)
Spoke with patient and advised of results and recommendations.

## 2021-07-14 ENCOUNTER — Other Ambulatory Visit: Payer: Self-pay

## 2021-07-15 ENCOUNTER — Telehealth: Payer: Self-pay | Admitting: Family Medicine

## 2021-07-15 NOTE — Telephone Encounter (Signed)
Patient called to report she finally made a bowel movement last night and feels great.   Patient wants to travel to see sick sister this weekend; wants provider's approval.   Please advise at [phone].

## 2021-07-16 NOTE — Telephone Encounter (Signed)
Spoke with pt and she states she is feeling much better. Advised pt she can travel to see ailing sister and to please be mindful of taking care of herself as well. She should continue current regimen to avoid further constipation. She should not travel if she begins to feel ill, develops any s/s of illness or does not have someone to assist her with transportation and care. Pt voiced understanding. She will consult with her children and family prior to deciding. Nothing further needed at this time.

## 2021-07-27 ENCOUNTER — Telehealth: Payer: Self-pay | Admitting: Family Medicine

## 2021-07-27 NOTE — Telephone Encounter (Signed)
Left message for patient to call back and schedule Medicare Annual Wellness Visit (AWV) in office.   If not able to come in office, please offer to do virtually or by telephone.  Left office number and my jabber [phone].  Last AWV:03/16/2017  Please schedule at anytime with Nurse Health Advisor.

## 2021-08-01 DIAGNOSIS — I110 Hypertensive heart disease with heart failure: Secondary | ICD-10-CM | POA: Diagnosis not present

## 2021-08-01 DIAGNOSIS — F419 Anxiety disorder, unspecified: Secondary | ICD-10-CM | POA: Diagnosis present

## 2021-08-01 DIAGNOSIS — E876 Hypokalemia: Secondary | ICD-10-CM | POA: Diagnosis not present

## 2021-08-01 DIAGNOSIS — E861 Hypovolemia: Secondary | ICD-10-CM | POA: Diagnosis not present

## 2021-08-01 DIAGNOSIS — E0590 Thyrotoxicosis, unspecified without thyrotoxic crisis or storm: Secondary | ICD-10-CM | POA: Diagnosis present

## 2021-08-01 DIAGNOSIS — I214 Non-ST elevation (NSTEMI) myocardial infarction: Secondary | ICD-10-CM | POA: Diagnosis not present

## 2021-08-01 DIAGNOSIS — D649 Anemia, unspecified: Secondary | ICD-10-CM | POA: Diagnosis not present

## 2021-08-01 DIAGNOSIS — E872 Acidosis, unspecified: Secondary | ICD-10-CM | POA: Diagnosis not present

## 2021-08-01 DIAGNOSIS — U071 COVID-19: Secondary | ICD-10-CM | POA: Diagnosis not present

## 2021-08-01 DIAGNOSIS — Z8616 Personal history of COVID-19: Secondary | ICD-10-CM | POA: Diagnosis not present

## 2021-08-01 DIAGNOSIS — Z743 Need for continuous supervision: Secondary | ICD-10-CM | POA: Diagnosis not present

## 2021-08-01 DIAGNOSIS — E785 Hyperlipidemia, unspecified: Secondary | ICD-10-CM | POA: Diagnosis not present

## 2021-08-01 DIAGNOSIS — Z20822 Contact with and (suspected) exposure to covid-19: Secondary | ICD-10-CM | POA: Diagnosis not present

## 2021-08-01 DIAGNOSIS — I161 Hypertensive emergency: Secondary | ICD-10-CM | POA: Diagnosis not present

## 2021-08-01 DIAGNOSIS — E1165 Type 2 diabetes mellitus with hyperglycemia: Secondary | ICD-10-CM | POA: Diagnosis not present

## 2021-08-01 DIAGNOSIS — I509 Heart failure, unspecified: Secondary | ICD-10-CM | POA: Diagnosis not present

## 2021-08-01 DIAGNOSIS — N289 Disorder of kidney and ureter, unspecified: Secondary | ICD-10-CM | POA: Diagnosis not present

## 2021-08-01 DIAGNOSIS — E119 Type 2 diabetes mellitus without complications: Secondary | ICD-10-CM | POA: Diagnosis not present

## 2021-08-01 DIAGNOSIS — I5020 Unspecified systolic (congestive) heart failure: Secondary | ICD-10-CM | POA: Diagnosis not present

## 2021-08-01 DIAGNOSIS — I5021 Acute systolic (congestive) heart failure: Secondary | ICD-10-CM | POA: Diagnosis not present

## 2021-08-01 DIAGNOSIS — I498 Other specified cardiac arrhythmias: Secondary | ICD-10-CM | POA: Diagnosis not present

## 2021-08-01 DIAGNOSIS — I429 Cardiomyopathy, unspecified: Secondary | ICD-10-CM | POA: Diagnosis present

## 2021-08-01 DIAGNOSIS — R0902 Hypoxemia: Secondary | ICD-10-CM | POA: Diagnosis not present

## 2021-08-01 DIAGNOSIS — F32A Depression, unspecified: Secondary | ICD-10-CM | POA: Diagnosis present

## 2021-08-01 DIAGNOSIS — J9601 Acute respiratory failure with hypoxia: Secondary | ICD-10-CM | POA: Diagnosis not present

## 2021-08-01 DIAGNOSIS — F418 Other specified anxiety disorders: Secondary | ICD-10-CM | POA: Diagnosis not present

## 2021-08-01 DIAGNOSIS — E871 Hypo-osmolality and hyponatremia: Secondary | ICD-10-CM | POA: Diagnosis not present

## 2021-08-05 DIAGNOSIS — R7303 Prediabetes: Secondary | ICD-10-CM | POA: Diagnosis not present

## 2021-08-05 DIAGNOSIS — R0602 Shortness of breath: Secondary | ICD-10-CM | POA: Diagnosis not present

## 2021-08-05 DIAGNOSIS — R079 Chest pain, unspecified: Secondary | ICD-10-CM | POA: Diagnosis not present

## 2021-08-05 DIAGNOSIS — E0590 Thyrotoxicosis, unspecified without thyrotoxic crisis or storm: Secondary | ICD-10-CM | POA: Diagnosis not present

## 2021-08-05 DIAGNOSIS — N19 Unspecified kidney failure: Secondary | ICD-10-CM | POA: Diagnosis not present

## 2021-08-05 DIAGNOSIS — I110 Hypertensive heart disease with heart failure: Secondary | ICD-10-CM | POA: Diagnosis not present

## 2021-08-05 DIAGNOSIS — I5021 Acute systolic (congestive) heart failure: Secondary | ICD-10-CM | POA: Diagnosis not present

## 2021-08-05 DIAGNOSIS — I429 Cardiomyopathy, unspecified: Secondary | ICD-10-CM | POA: Diagnosis not present

## 2021-08-05 DIAGNOSIS — K219 Gastro-esophageal reflux disease without esophagitis: Secondary | ICD-10-CM | POA: Diagnosis not present

## 2021-08-05 DIAGNOSIS — E861 Hypovolemia: Secondary | ICD-10-CM | POA: Diagnosis not present

## 2021-08-05 DIAGNOSIS — I161 Hypertensive emergency: Secondary | ICD-10-CM | POA: Diagnosis not present

## 2021-08-05 DIAGNOSIS — I10 Essential (primary) hypertension: Secondary | ICD-10-CM | POA: Diagnosis not present

## 2021-08-05 DIAGNOSIS — E785 Hyperlipidemia, unspecified: Secondary | ICD-10-CM | POA: Diagnosis not present

## 2021-08-05 DIAGNOSIS — D649 Anemia, unspecified: Secondary | ICD-10-CM | POA: Diagnosis not present

## 2021-08-05 DIAGNOSIS — N179 Acute kidney failure, unspecified: Secondary | ICD-10-CM | POA: Diagnosis not present

## 2021-08-05 DIAGNOSIS — Z8616 Personal history of COVID-19: Secondary | ICD-10-CM | POA: Diagnosis not present

## 2021-08-05 DIAGNOSIS — N1831 Chronic kidney disease, stage 3a: Secondary | ICD-10-CM | POA: Diagnosis not present

## 2021-08-05 DIAGNOSIS — I2510 Atherosclerotic heart disease of native coronary artery without angina pectoris: Secondary | ICD-10-CM | POA: Diagnosis not present

## 2021-08-05 DIAGNOSIS — I214 Non-ST elevation (NSTEMI) myocardial infarction: Secondary | ICD-10-CM | POA: Diagnosis not present

## 2021-08-16 DIAGNOSIS — I10 Essential (primary) hypertension: Secondary | ICD-10-CM | POA: Diagnosis not present

## 2021-08-16 DIAGNOSIS — I2510 Atherosclerotic heart disease of native coronary artery without angina pectoris: Secondary | ICD-10-CM | POA: Diagnosis not present

## 2021-08-16 DIAGNOSIS — I509 Heart failure, unspecified: Secondary | ICD-10-CM | POA: Diagnosis not present

## 2021-08-16 DIAGNOSIS — E785 Hyperlipidemia, unspecified: Secondary | ICD-10-CM | POA: Diagnosis not present

## 2021-08-20 ENCOUNTER — Other Ambulatory Visit: Payer: Self-pay | Admitting: Family Medicine

## 2021-08-20 DIAGNOSIS — I10 Essential (primary) hypertension: Secondary | ICD-10-CM | POA: Diagnosis not present

## 2021-08-20 DIAGNOSIS — I509 Heart failure, unspecified: Secondary | ICD-10-CM | POA: Diagnosis not present

## 2021-08-23 ENCOUNTER — Other Ambulatory Visit: Payer: Self-pay

## 2021-08-25 ENCOUNTER — Telehealth: Payer: Self-pay | Admitting: Family Medicine

## 2021-08-25 NOTE — Telephone Encounter (Signed)
I attempted to leave message for patient to call back and schedule Medicare Annual Wellness Visit (AWV) in office. No voice mail.  If not able to come in office, please offer to do virtually or by telephone.  Left office number and my jabber [phone].  Last AWV:03/16/2017  Please schedule at anytime with Nurse Health Advisor.

## 2021-09-10 ENCOUNTER — Telehealth: Payer: Self-pay | Admitting: Obstetrics and Gynecology

## 2021-09-14 DIAGNOSIS — Z961 Presence of intraocular lens: Secondary | ICD-10-CM | POA: Diagnosis not present

## 2021-09-14 DIAGNOSIS — H524 Presbyopia: Secondary | ICD-10-CM | POA: Diagnosis not present

## 2021-09-14 DIAGNOSIS — H52203 Unspecified astigmatism, bilateral: Secondary | ICD-10-CM | POA: Diagnosis not present

## 2021-09-23 DIAGNOSIS — R404 Transient alteration of awareness: Secondary | ICD-10-CM | POA: Diagnosis not present

## 2021-09-27 ENCOUNTER — Telehealth: Payer: Self-pay | Admitting: Family Medicine

## 2021-09-27 NOTE — Telephone Encounter (Signed)
Received call from [place] to notify provider patient's info is in the DC portal; requesting provider's signature so patient can be cremated. Please advise at [phone].  ?

## 2021-09-28 NOTE — Telephone Encounter (Signed)
Left msg for Traid Crematio. ?Per Dr. Dennard Schaumann already sign the DC earlier via email ?

## 2021-10-02 DIAGNOSIS — 419620001 Death: Secondary | SNOMED CT | POA: Diagnosis not present

## 2021-10-02 DEATH — deceased

## 2021-10-05 NOTE — Telephone Encounter (Signed)
error 

## 2021-10-14 ENCOUNTER — Ambulatory Visit: Payer: Medicare Other | Admitting: Registered Nurse

## 2021-10-15 ENCOUNTER — Ambulatory Visit: Payer: Medicare Other | Admitting: Registered Nurse
